# Patient Record
Sex: Female | Born: 1970 | Race: Black or African American | Hispanic: No | Marital: Married | Smoking: Never smoker
Health system: Southern US, Community
[De-identification: ages and names within clinical notes are randomized; demographics above are authoritative.]

## PROBLEM LIST (undated history)

## (undated) DIAGNOSIS — K219 Gastro-esophageal reflux disease without esophagitis: Secondary | ICD-10-CM

## (undated) DIAGNOSIS — G4730 Sleep apnea, unspecified: Secondary | ICD-10-CM

## (undated) DIAGNOSIS — E785 Hyperlipidemia, unspecified: Secondary | ICD-10-CM

## (undated) DIAGNOSIS — M7989 Other specified soft tissue disorders: Secondary | ICD-10-CM

## (undated) DIAGNOSIS — K5900 Constipation, unspecified: Secondary | ICD-10-CM

## (undated) DIAGNOSIS — M549 Dorsalgia, unspecified: Secondary | ICD-10-CM

## (undated) DIAGNOSIS — M2550 Pain in unspecified joint: Secondary | ICD-10-CM

## (undated) DIAGNOSIS — E739 Lactose intolerance, unspecified: Secondary | ICD-10-CM

## (undated) DIAGNOSIS — R42 Dizziness and giddiness: Secondary | ICD-10-CM

## (undated) DIAGNOSIS — I10 Essential (primary) hypertension: Secondary | ICD-10-CM

## (undated) HISTORY — DX: Sleep apnea, unspecified: G47.30

## (undated) HISTORY — PX: DILATION AND CURETTAGE OF UTERUS: SHX78

## (undated) HISTORY — DX: Pain in unspecified joint: M25.50

## (undated) HISTORY — PX: JOINT REPLACEMENT: SHX530

## (undated) HISTORY — DX: Other specified soft tissue disorders: M79.89

## (undated) HISTORY — DX: Gastro-esophageal reflux disease without esophagitis: K21.9

## (undated) HISTORY — DX: Essential (primary) hypertension: I10

## (undated) HISTORY — DX: Lactose intolerance, unspecified: E73.9

## (undated) HISTORY — DX: Hyperlipidemia, unspecified: E78.5

## (undated) HISTORY — DX: Dorsalgia, unspecified: M54.9

## (undated) HISTORY — DX: Constipation, unspecified: K59.00

---

## 2006-01-31 ENCOUNTER — Ambulatory Visit (HOSPITAL_COMMUNITY): Admission: RE | Admit: 2006-01-31 | Discharge: 2006-01-31 | Payer: Self-pay | Admitting: *Deleted

## 2006-05-09 ENCOUNTER — Inpatient Hospital Stay (HOSPITAL_COMMUNITY): Admission: AD | Admit: 2006-05-09 | Discharge: 2006-05-09 | Payer: Self-pay | Admitting: *Deleted

## 2006-06-17 ENCOUNTER — Inpatient Hospital Stay (HOSPITAL_COMMUNITY): Admission: AD | Admit: 2006-06-17 | Discharge: 2006-06-17 | Payer: Self-pay | Admitting: Obstetrics and Gynecology

## 2006-07-13 ENCOUNTER — Inpatient Hospital Stay (HOSPITAL_COMMUNITY): Admission: AD | Admit: 2006-07-13 | Discharge: 2006-07-13 | Payer: Self-pay | Admitting: *Deleted

## 2006-07-19 ENCOUNTER — Inpatient Hospital Stay (HOSPITAL_COMMUNITY): Admission: AD | Admit: 2006-07-19 | Discharge: 2006-07-21 | Payer: Self-pay | Admitting: Obstetrics & Gynecology

## 2010-12-14 ENCOUNTER — Ambulatory Visit: Payer: Self-pay | Admitting: Family Medicine

## 2010-12-14 DIAGNOSIS — Z0289 Encounter for other administrative examinations: Secondary | ICD-10-CM

## 2011-02-02 ENCOUNTER — Ambulatory Visit: Payer: Self-pay | Admitting: Family Medicine

## 2011-02-10 ENCOUNTER — Encounter: Payer: Self-pay | Admitting: Family Medicine

## 2011-02-10 ENCOUNTER — Ambulatory Visit (INDEPENDENT_AMBULATORY_CARE_PROVIDER_SITE_OTHER): Payer: BC Managed Care – PPO | Admitting: Family Medicine

## 2011-02-10 DIAGNOSIS — E669 Obesity, unspecified: Secondary | ICD-10-CM

## 2011-02-10 DIAGNOSIS — E785 Hyperlipidemia, unspecified: Secondary | ICD-10-CM

## 2011-02-10 DIAGNOSIS — I10 Essential (primary) hypertension: Secondary | ICD-10-CM | POA: Insufficient documentation

## 2011-02-10 DIAGNOSIS — B351 Tinea unguium: Secondary | ICD-10-CM

## 2011-02-10 DIAGNOSIS — L659 Nonscarring hair loss, unspecified: Secondary | ICD-10-CM

## 2011-02-10 HISTORY — DX: Tinea unguium: B35.1

## 2011-02-10 HISTORY — DX: Essential (primary) hypertension: I10

## 2011-02-10 HISTORY — DX: Morbid (severe) obesity due to excess calories: E66.01

## 2011-02-10 NOTE — Patient Instructions (Signed)
Please schedule a lab appt at your convenience Schedule your physical in 3 months Keep up the good work on regular exercise- this is so important! Make small changes in the diet and you'll see big results! I'm so proud of you for being proactive in your health! Someone will call you with the podiatry appt Call with any questions or concerns Welcome!  We're glad to have you!

## 2011-02-10 NOTE — Progress Notes (Signed)
  Subjective:    Patient ID: Marcia Chan, female    DOB: April 28, 1971, 40 y.o.   MRN: [id number]  HPI New to establish.  Previous MD- at [facility], he retired.  HTN- chronic problem, well controlled on Coreg and Maxide.  Recently switched from Bystolic to Coreg due to cost.  No CP, SOB, HAs, visual changes, edema.  Hyperlipidemia- chronic problem, last labs done 17 months ago.  On Pravastatin 40, no abd pain, N/V, myalgias.  Obesity- ongoing battle for pt, has started exercising regularly.  Admits she's not following particular diet.  Nail fungus- sxs x3 yrs, has taken oral meds w/out results.  Feet bilaterally  Hair loss- started 10 yrs ago.  Has seen 2 different dermatologists w/out results.   Review of Systems For ROS see HPI     Objective:   Physical Exam  Vitals reviewed. Constitutional: She is oriented to person, place, and time. She appears well-developed and well-nourished. No distress.  HENT:  Head: Normocephalic and atraumatic.       Marked alopecia at crown  Eyes: Conjunctivae and EOM are normal. Pupils are equal, round, and reactive to light.  Neck: Normal range of motion. Neck supple. No thyromegaly present.  Cardiovascular: Normal rate, regular rhythm, normal heart sounds and intact distal pulses.   No murmur heard. Pulmonary/Chest: Effort normal and breath sounds normal. No respiratory distress.  Abdominal: Soft. She exhibits no distension. There is no tenderness.  Musculoskeletal: She exhibits no edema.  Lymphadenopathy:    She has no cervical adenopathy.  Neurological: She is alert and oriented to person, place, and time.  Skin: Skin is warm and dry.       Toenail fungus bilaterally  Psychiatric: She has a normal mood and affect. Her behavior is normal.          Assessment & Plan:

## 2011-02-13 ENCOUNTER — Telehealth: Payer: Self-pay

## 2011-02-13 ENCOUNTER — Other Ambulatory Visit (INDEPENDENT_AMBULATORY_CARE_PROVIDER_SITE_OTHER): Payer: BC Managed Care – PPO

## 2011-02-13 DIAGNOSIS — E785 Hyperlipidemia, unspecified: Secondary | ICD-10-CM

## 2011-02-13 DIAGNOSIS — I10 Essential (primary) hypertension: Secondary | ICD-10-CM

## 2011-02-13 LAB — BASIC METABOLIC PANEL
BUN: 11 mg/dL (ref 6–23)
CO2: 29 mEq/L (ref 19–32)
Calcium: 8.8 mg/dL (ref 8.4–10.5)
Creatinine, Ser: 0.7 mg/dL (ref 0.4–1.2)
GFR: 116.66 mL/min (ref >60.00)
Glucose, Bld: 96 mg/dL (ref 70–99)
Potassium: 3.8 mEq/L (ref 3.5–5.1)

## 2011-02-13 LAB — CBC WITH DIFFERENTIAL/PLATELET
Basophils Relative: 0.4 % (ref 0.0–3.0)
Eosinophils Relative: 0.8 % (ref 0.0–5.0)
HCT: 36.8 % (ref 36.0–46.0)
Lymphocytes Relative: 32.9 % (ref 12.0–46.0)
MCHC: 33.6 g/dL (ref 30.0–36.0)
Platelets: 210 10*3/uL (ref 150.0–400.0)
RBC: 4.05 Mil/uL (ref 3.87–5.11)

## 2011-02-13 LAB — LIPID PANEL
LDL Cholesterol: 86 mg/dL (ref 0–99)
Total CHOL/HDL Ratio: 4
Triglycerides: 85 mg/dL (ref 0.0–149.0)

## 2011-02-13 LAB — HEPATIC FUNCTION PANEL
Albumin: 3.9 g/dL (ref 3.5–5.2)
Total Bilirubin: 0.6 mg/dL (ref 0.3–1.2)

## 2011-02-13 NOTE — Progress Notes (Signed)
Labs only

## 2011-02-13 NOTE — Telephone Encounter (Signed)
Message copied by Beverely Low on Mon Feb 13, 2011  5:03 PM ------      Message from: Sheliah Hatch      Created: Mon Feb 13, 2011  3:02 PM       Labs look good!  Keep up the good work!

## 2011-02-13 NOTE — Telephone Encounter (Signed)
Labs mailed

## 2011-02-21 ENCOUNTER — Encounter: Payer: Self-pay | Admitting: Family Medicine

## 2011-02-21 DIAGNOSIS — L659 Nonscarring hair loss, unspecified: Secondary | ICD-10-CM | POA: Insufficient documentation

## 2011-02-21 HISTORY — DX: Nonscarring hair loss, unspecified: L65.9

## 2011-02-21 NOTE — Assessment & Plan Note (Signed)
Hair loss is extensive.  Will refer to derm for additional evaluation.

## 2011-02-21 NOTE — Progress Notes (Signed)
Addended by: Sheliah Hatch on: 02/21/2011 02:13 PM   Modules accepted: Orders

## 2011-02-21 NOTE — Assessment & Plan Note (Signed)
Tolerating statin w/out difficulty.  Due for labs.  Adjust meds prn. 

## 2011-02-21 NOTE — Assessment & Plan Note (Signed)
Pt now exercising, encouraged her to focus on making small dietary changes.  Will follow closely.

## 2011-02-21 NOTE — Assessment & Plan Note (Signed)
BP is well controlled.  Tolerating meds w/out difficulty.  Asymptomatic.  No changes

## 2011-02-21 NOTE — Assessment & Plan Note (Addendum)
Since pt has taken oral meds before w/out results will defer to podiatry for additional tx.  Pt expressed understanding and is in agreement w/ plan.

## 2011-05-10 ENCOUNTER — Ambulatory Visit: Payer: BC Managed Care – PPO | Admitting: Family Medicine

## 2011-05-10 DIAGNOSIS — Z0289 Encounter for other administrative examinations: Secondary | ICD-10-CM

## 2011-06-02 ENCOUNTER — Ambulatory Visit (INDEPENDENT_AMBULATORY_CARE_PROVIDER_SITE_OTHER): Payer: BC Managed Care – PPO | Admitting: Family

## 2011-06-02 ENCOUNTER — Encounter: Payer: Self-pay | Admitting: Family

## 2011-06-02 VITALS — BP 126/88 | HR 76 | Temp 98.2°F | Resp 18 | Wt 284.0 lb

## 2011-06-02 DIAGNOSIS — J069 Acute upper respiratory infection, unspecified: Secondary | ICD-10-CM

## 2011-06-02 NOTE — Progress Notes (Signed)
  Subjective:    Patient ID: Marcia Chan, female    DOB: Sep 23, 1970, 40 y.o.   MRN: [id number]  HPI Ms. Marcia Chan is a 40 yr old patient of Dr. Rennis Golden who presents today with chief complaint of sore throat/nasal congestion.  Symptoms started 2-3 days ago with "sore throat." Then started losing her voice and developed nasal drainage. She has a productive cough.  She reports that her cough has worsening. She has been using warm honey, mucinex with minimal relief. Denies fever or ear pain.     Review of Systems See HPI  Past Medical History  Diagnosis Date  . Hyperlipidemia   . Hypertension     History   Social History  . Marital Status: Married    Spouse Name: N/A    Number of Children: N/A  . Years of Education: N/A   Occupational History  . Not on file.   Social History Main Topics  . Smoking status: Never Smoker   . Smokeless tobacco: Not on file  . Alcohol Use: No  . Drug Use: No  . Sexually Active: Not on file   Other Topics Concern  . Not on file   Social History Narrative   G3 P1 A2    No past surgical history on file.  Family History  Problem Relation Age of Onset  . Arthritis Mother   . Heart disease Mother   . Diabetes Mother   . Hypertension Mother     Allergies  Allergen Reactions  . Bextra (Valdecoxib)     Current Outpatient Prescriptions on File Prior to Visit  Medication Sig Dispense Refill  . carvedilol (COREG) 6.25 MG tablet Take 6.25 mg by mouth 2 (two) times daily with a meal.        . pravastatin (PRAVACHOL) 80 MG tablet Take 40 mg by mouth daily.        Marland Kitchen triamterene-hydrochlorothiazide (MAXZIDE-25) 37.5-25 MG per tablet Take 1 tablet by mouth daily.          BP 126/88  Pulse 76  Temp(Src) 98.2 F (36.8 C) (Oral)  Resp 18  Wt 284 lb (128.822 kg)  SpO2 100%       Objective:   Physical Exam  Constitutional: She appears well-developed and well-nourished. No distress.  HENT:  Head: Normocephalic and atraumatic.  Right  Ear: Tympanic membrane and ear canal normal.  Left Ear: Tympanic membrane and ear canal normal.  Mouth/Throat: Uvula is midline and oropharynx is clear and moist. No oropharyngeal exudate, posterior oropharyngeal edema or posterior oropharyngeal erythema.  Cardiovascular: Normal rate and regular rhythm.   No murmur heard. Pulmonary/Chest: Effort normal and breath sounds normal. No respiratory distress. She has no wheezes. She has no rales. She exhibits no tenderness.          Assessment & Plan:  URI- rapid strep is negative.  Recommended supportive measures as outlined in AVS and to call if symptoms worsen or do not improve.

## 2011-06-02 NOTE — Patient Instructions (Signed)

## 2011-06-12 ENCOUNTER — Ambulatory Visit: Payer: BC Managed Care – PPO | Admitting: Family Medicine

## 2011-06-21 ENCOUNTER — Ambulatory Visit (INDEPENDENT_AMBULATORY_CARE_PROVIDER_SITE_OTHER): Payer: BC Managed Care – PPO | Admitting: Family Medicine

## 2011-06-21 VITALS — BP 120/74 | HR 78 | Temp 98.9°F | Wt 286.0 lb

## 2011-06-21 DIAGNOSIS — R059 Cough, unspecified: Secondary | ICD-10-CM

## 2011-06-21 DIAGNOSIS — R05 Cough: Secondary | ICD-10-CM

## 2011-06-21 HISTORY — DX: Cough, unspecified: R05.9

## 2011-06-21 MED ORDER — AZITHROMYCIN 250 MG PO TABS: 250.0000 mg | ORAL_TABLET | Freq: Every day | ORAL | Status: AC

## 2011-06-21 MED ORDER — GUAIFENESIN-CODEINE 100-10 MG/5ML PO SYRP: 5.0000 mL | ORAL_SOLUTION | Freq: Three times a day (TID) | ORAL | Status: AC | PRN

## 2011-06-21 MED ORDER — BENZONATATE 200 MG PO CAPS: 200.0000 mg | ORAL_CAPSULE | Freq: Three times a day (TID) | ORAL | Status: AC | PRN

## 2011-06-21 NOTE — Progress Notes (Signed)
  Subjective:    Patient ID: Marcia Chan, female    DOB: Jan 25, 1971, 40 y.o.   MRN: [id number]  HPI Cough- sxs started 1 month ago.  sxs were initially nasal congestion, PND, cough productive of yellow sputum.  Went to [facility], was told it was viral URI.  sxs initially improved but 10 days after that (2 weeks ago) developed severe cough.  Went to [facility] last Saturday- dx'd w/ bronchitis.  Had breathing tx, normal lab work.  No abx given.  Was told to f/u w/ PCP.  Again started feeling better but 2-3 days ago developed dry, barking cough.  This cough is painful.  No nasal congestion, ear pain, fevers, body aches.   Review of Systems For ROS see HPI     Objective:   Physical Exam  Constitutional: She appears well-developed and well-nourished. No distress.  HENT:  Head: Normocephalic and atraumatic.       TMs normal bilaterally Mild nasal congestion Throat w/out erythema, edema, or exudate  Eyes: Conjunctivae and EOM are normal. Pupils are equal, round, and reactive to light.  Neck: Normal range of motion. Neck supple.  Cardiovascular: Normal rate, regular rhythm, normal heart sounds and intact distal pulses.   No murmur heard. Pulmonary/Chest: Effort normal and breath sounds normal. No respiratory distress. She has no wheezes.       + hacking cough  Lymphadenopathy:    She has no cervical adenopathy.          Assessment & Plan:

## 2011-06-21 NOTE — Patient Instructions (Signed)
Take the Zpack as directed for likely bronchitis Use the cough syrup at night and pills for daytime Drink plenty of fluids REST! Ibuprofen 2 tabs 3x/day for airway inflammation Call if no improvement Happy Holidays!!!

## 2011-06-25 NOTE — Assessment & Plan Note (Signed)
Pt's sxs consistent w/ bronchitis.  Start zpack.  Cough meds prn.  Reviewed supportive care and red flags that should prompt return.  Pt expressed understanding and is in agreement w/ plan.

## 2011-07-24 ENCOUNTER — Encounter: Payer: Self-pay | Admitting: Family Medicine

## 2011-07-24 ENCOUNTER — Ambulatory Visit (INDEPENDENT_AMBULATORY_CARE_PROVIDER_SITE_OTHER): Payer: BC Managed Care – PPO | Admitting: Family Medicine

## 2011-07-24 VITALS — BP 126/82 | HR 76 | Temp 98.8°F | Wt 285.0 lb

## 2011-07-24 DIAGNOSIS — R1013 Epigastric pain: Secondary | ICD-10-CM

## 2011-07-24 DIAGNOSIS — K3189 Other diseases of stomach and duodenum: Secondary | ICD-10-CM

## 2011-07-24 NOTE — Patient Instructions (Signed)
Diet for GERD or PUD Nutrition therapy can help ease the discomfort of gastroesophageal reflux disease (GERD) and peptic ulcer disease (PUD).  HOME CARE INSTRUCTIONS   Eat your meals slowly, in a relaxed setting.   Eat 5 to 6 small meals per day.   If a food causes distress, stop eating it for a period of time.  FOODS TO AVOID  Coffee, regular or decaffeinated.   Cola beverages, regular or low calorie.   Tea, regular or decaffeinated.   Pepper.   Cocoa.   High fat foods, including meats.   Butter, margarine, hydrogenated oil (trans fats).   Peppermint or spearmint (if you have GERD).   Fruits and vegetables if not tolerated.   Alcohol.   Nicotine (smoking or chewing). This is one of the most potent stimulants to acid production in the gastrointestinal tract.   Any food that seems to aggravate your condition.  If you have questions regarding your diet, ask your caregiver or a registered dietitian. TIPS  Lying flat may make symptoms worse. Keep the head of your bed raised 6 to 9 inches (15 to 23 cm) by using a foam wedge or blocks under the legs of the bed.   Do not lay down until 3 hours after eating a meal.   Daily physical activity may help reduce symptoms.  MAKE SURE YOU:   Understand these instructions.   Will watch your condition.   Will get help right away if you are not doing well or get worse.  Document Released: 06/19/2005 Document Revised: 03/01/2011 Document Reviewed: 11/02/2008 [employer] Patient Information 2012 [employer]. 

## 2011-07-24 NOTE — Progress Notes (Signed)
  Subjective:     Marcia Chan is a 41 y.o. female who presents for evaluation of abdominal pain. Onset was several weeks ago. Symptoms have been gradually worsening. The pain is described as burning, and is 5/10 in intensity. Pain is located in the epigastric region without radiation.  Aggravating factors: eating.  Alleviating factors: antacids, belching, flatus, H2 blockers and proton pump inhibitors. Associated symptoms: belching. The patient denies anorexia, arthralagias, belching, chills, constipation, diarrhea, dysuria, fever, flatus, frequency, headache, hematochezia, hematuria, melena, myalgias, nausea, sweats, vomiting and diarrhea.  The patient's history has been marked as reviewed and updated as appropriate.  Review of Systems Pertinent items are noted in HPI.     Objective:    BP 126/82  Pulse 76  Temp(Src) 98.8 F (37.1 C) (Oral)  Wt 285 lb (129.275 kg)  SpO2 97% General appearance: alert, cooperative, appears stated age and no distress Nose: Nares normal. Septum midline. Mucosa normal. No drainage or sinus tenderness. Throat: lips, mucosa, and tongue normal; teeth and gums normal Neck: no adenopathy, no carotid bruit, no JVD, supple, symmetrical, trachea midline and thyroid not enlarged, symmetric, no tenderness/mass/nodules Lungs: clear to auscultation bilaterally Extremities: extremities normal, atraumatic, no cyanosis or edema   abd--soft , nt, no guarding Assessment:    Abdominal pain, likely secondary to dyspepsia .    Plan:  nexium 40 mg 1 po qd Check labs HO on gerd given to pt  The diagnosis was discussed with the patient and evaluation and treatment plans outlined. See orders for lab and imaging studies. Adhere to simple, bland diet. Adhere to low fat diet. Follow up as needed.

## 2011-07-25 LAB — BASIC METABOLIC PANEL
BUN: 10 mg/dL (ref 6–23)
CO2: 30 mEq/L (ref 19–32)
Calcium: 9.6 mg/dL (ref 8.4–10.5)
Chloride: 101 mEq/L (ref 96–112)
GFR: 133.62 mL/min (ref >60.00)
Potassium: 3.6 mEq/L (ref 3.5–5.1)
Sodium: 136 mEq/L (ref 135–145)

## 2011-07-25 LAB — HEPATIC FUNCTION PANEL
ALT: 15 U/L (ref 0–35)
AST: 21 U/L (ref 0–37)
Alkaline Phosphatase: 55 U/L (ref 39–117)
Bilirubin, Direct: 0.1 mg/dL (ref 0.0–0.3)
Total Bilirubin: 0.6 mg/dL (ref 0.3–1.2)
Total Protein: 7.5 g/dL (ref 6.0–8.3)

## 2011-07-25 LAB — CBC WITH DIFFERENTIAL/PLATELET
Basophils Relative: 0.6 % (ref 0.0–3.0)
HCT: 39.9 % (ref 36.0–46.0)
Hemoglobin: 13.6 g/dL (ref 12.0–15.0)
Lymphs Abs: 2.4 10*3/uL (ref 0.7–4.0)
MCHC: 34.1 g/dL (ref 30.0–36.0)
Monocytes Relative: 3.9 % (ref 3.0–12.0)
RDW: 12.8 % (ref 11.5–14.6)

## 2011-07-25 LAB — H. PYLORI ANTIBODY, IGG: H Pylori IgG: NEGATIVE

## 2011-08-01 ENCOUNTER — Telehealth: Payer: Self-pay | Admitting: Family Medicine

## 2011-08-01 NOTE — Telephone Encounter (Signed)
.  left message to have patient return my call.  

## 2011-08-01 NOTE — Telephone Encounter (Signed)
Patient calling, currently is unable to view her recent lab results thru MyChart (is calling technical support), but she would like her lab results please.

## 2011-08-02 ENCOUNTER — Telehealth: Payer: Self-pay | Admitting: Family Medicine

## 2011-08-02 NOTE — Telephone Encounter (Signed)
Last OV 07-24-11 pt called in for results, please advise

## 2011-08-02 NOTE — Telephone Encounter (Signed)
Patient called the office asking to have someone call her back in regards to her lab results. She stated that she called on 1/29 and hasn't heard from anyone yet. Best number to reach her at is her cell # [phone]. Please do not call home number because she has no answering machine.

## 2011-08-02 NOTE — Telephone Encounter (Signed)
Noted pt labs have not been routed to [facility] per she had OV with pt on 07-24-11 pt noted her sxs have improved somewhat with the nexium however still hard to deal with per she still has bloated stomach feeling when she eats mostly and more at night, please advise

## 2011-08-02 NOTE — Telephone Encounter (Signed)
Pt should continue the Nexium, follow the lifestyle modifications as outlined in her pt instructions.  Add Gas-X or Beano as needed for gas and bloating.  Needs to give the Nexium more time to work- it has only been 9 days.  If sxs continue, will need GI referral

## 2011-08-02 NOTE — Telephone Encounter (Signed)
Pt calling in to received results, noted OV 07-24-11, please advise    Called pt to advise instructions for current sxs, pt understood and will try the gas-x and will notify office is sxs continue.

## 2011-08-03 NOTE — Telephone Encounter (Signed)
Dr.Lowne patient is calling for her test results. Please advise     KP

## 2011-08-03 NOTE — Telephone Encounter (Signed)
Cbc looks like virus Repeat 2-3 weeks  288.8  cbcd Rest labs normal

## 2011-08-03 NOTE — Telephone Encounter (Signed)
Did not order labs on pt

## 2011-08-03 NOTE — Telephone Encounter (Signed)
Discussed with patient and she voiced understanding, advised to drink plenty of fluids and be sure to rest---recheck scheduled for 2/15  KP

## 2011-08-03 NOTE — Telephone Encounter (Signed)
Agree with Dr Beverely Low---- GI referral put in

## 2011-08-03 NOTE — Progress Notes (Signed)
Addended by: Lelon Perla on: 08/03/2011 08:38 AM   Modules accepted: Orders

## 2011-08-08 NOTE — Telephone Encounter (Signed)
Labs were discussed--see phone note    KP

## 2011-08-08 NOTE — Telephone Encounter (Signed)
This patient had labs done on 07-24-11, and just needs her results please.

## 2011-08-17 ENCOUNTER — Other Ambulatory Visit: Payer: Self-pay | Admitting: Family Medicine

## 2011-08-17 DIAGNOSIS — D7289 Other specified disorders of white blood cells: Secondary | ICD-10-CM

## 2011-08-18 ENCOUNTER — Other Ambulatory Visit (INDEPENDENT_AMBULATORY_CARE_PROVIDER_SITE_OTHER): Payer: BC Managed Care – PPO

## 2011-08-18 DIAGNOSIS — D7289 Other specified disorders of white blood cells: Secondary | ICD-10-CM

## 2011-08-18 LAB — CBC WITH DIFFERENTIAL/PLATELET
Basophils Absolute: 0 10*3/uL (ref 0.0–0.1)
Basophils Relative: 0 % (ref 0–1)
Eosinophils Absolute: 0.1 10*3/uL (ref 0.0–0.7)
Hemoglobin: 12.6 g/dL (ref 12.0–15.0)
MCH: 29.2 pg (ref 26.0–34.0)
MCHC: 33.2 g/dL (ref 30.0–36.0)
Neutro Abs: 3.3 10*3/uL (ref 1.7–7.7)
Neutrophils Relative %: 44 % (ref 43–77)
Platelets: 251 10*3/uL (ref 150–400)
RDW: 12.1 % (ref 11.5–15.5)

## 2011-08-21 ENCOUNTER — Encounter: Payer: Self-pay | Admitting: *Deleted

## 2011-08-25 ENCOUNTER — Telehealth: Payer: Self-pay | Admitting: Family Medicine

## 2011-08-25 ENCOUNTER — Other Ambulatory Visit: Payer: Self-pay | Admitting: Family Medicine

## 2011-08-25 MED ORDER — CARVEDILOL 6.25 MG PO TABS: 6.2500 mg | ORAL_TABLET | Freq: Two times a day (BID) | ORAL | Status: DC

## 2011-08-25 MED ORDER — TRIAMTERENE-HCTZ 37.5-25 MG PO TABS: 1.0000 | ORAL_TABLET | Freq: Every day | ORAL | Status: DC

## 2011-08-25 MED ORDER — PRAVASTATIN SODIUM 80 MG PO TABS: 40.0000 mg | ORAL_TABLET | Freq: Every day | ORAL | Status: DC

## 2011-08-25 NOTE — Telephone Encounter (Signed)
rx sent to pharmacy by e-script  

## 2011-08-25 NOTE — Telephone Encounter (Signed)
Pt left vm stating she needs refills on her medications, called pt to advise that we received a fax concerning the HCTZ and that has already been sent, advised that she needs coreg and pravastatin, sent both via escribe for pt to be picked up. Pt understood

## 2011-08-25 NOTE — Telephone Encounter (Signed)
Triamterene 37.5MG /HCTZ 25MG  Tabs Generic: Maxzide-25 (37.5/25) Tabs (green)  Qty 90/each Last refill date 06-01-11

## 2011-08-25 NOTE — Telephone Encounter (Signed)
Refill: Pravastatin 40mg  tablets. Take 1 tablet by mouth every night at bedtime. Qty 90. Last fill 03-03-11

## 2011-08-28 ENCOUNTER — Encounter: Payer: Self-pay | Admitting: Internal Medicine

## 2011-08-28 ENCOUNTER — Ambulatory Visit (INDEPENDENT_AMBULATORY_CARE_PROVIDER_SITE_OTHER): Payer: BC Managed Care – PPO | Admitting: Internal Medicine

## 2011-08-28 VITALS — BP 122/90 | HR 74 | Temp 98.4°F | Wt 280.6 lb

## 2011-08-28 DIAGNOSIS — M545 Low back pain, unspecified: Secondary | ICD-10-CM

## 2011-08-28 MED ORDER — HYDROCODONE-ACETAMINOPHEN 7.5-500 MG PO TABS: 1.0000 | ORAL_TABLET | ORAL | Status: AC | PRN

## 2011-08-28 MED ORDER — CYCLOBENZAPRINE HCL 5 MG PO TABS: 5.0000 mg | ORAL_TABLET | Freq: Three times a day (TID) | ORAL | Status: AC | PRN

## 2011-08-28 NOTE — Patient Instructions (Signed)
Please remain out of work until  08/30/11

## 2011-08-28 NOTE — Progress Notes (Signed)
  Subjective:    Patient ID: Marcia Chan, female    DOB: [birth year], 41 y.o.   MRN: [id number]  HPI BACK PAIN: Location:lower back Onset:08/18/2010 Trigger/injury:after bending over to dress Pain quality:sharp Pain severity:up to 10+ Duration:present while moving Radiation:to L popliteal space  & into R thigh Exacerbating factors:any movement Treatment/response: Aleve & Tylenol helped   She has no past history of any significant back injury or orthopedic interventions. She has a history of rash with Bextra; she does nt remember why she was taking it.  Her mother has had degenerative joint disease of the knees     Review of Systems Constitutional: no fever, chills, sweats, change in weight  Musculoskeletal:no  muscle cramps or pain; no  joint stiffness, redness, or swelling Skin:no rash, color change Neuro: no weakness; incontinence (stool/urine); numbness and tingling Heme:no lymphadenopathy; abnormal bruising or bleeding   She denies hematuria, pyuria, or dysuria.        Objective:   Physical Exam  Gen.:well-nourished in appearance. Alert, appropriate and cooperative throughout exam.Uncomfortable  Eyes: No corneal or conjunctival inflammation noted. Neck: No deformities, masses, or tenderness noted. Range of motion normal Lungs: Normal respiratory effort; chest expands symmetrically. Lungs are clear to auscultation without rales, wheezes, or increased work of breathing. Heart: Normal rate and rhythm. Normal S1 and S2. No gallop, click, or rub. No murmur. Abdomen: Bowel sounds normal; abdomen soft and nontender. No masses, organomegaly or hernias noted.                                                      Musculoskeletal/extremities: No deformity or scoliosis noted of  the thoracic or lumbar spine. No clubbing, cyanosis, edema, or deformity noted. Range of motion  normal; pain in the left lumbosacral area with straight leg raising past 60. Classic "low back crawl" up and  down exam table .Tone & strength  normal.Joints normal. Nail health  good. Vascular: dorsalis pedis and  posterior tibial pulses are full and equal. No bruits present. Neurologic: Alert and oriented x3. Deep tendon reflexes symmetrical and normal. Gait is normal including tiptoe and heel walking        Skin: Intact without suspicious lesions or rashes. Lymph: No cervical, axillary lymphadenopathy present. Psych: Mood and affect are normal. Normally interactive                                                                                         Assessment & Plan:  #1 classic acute low back syndrome; no neuromuscular deficits present  Plan: See orders and recommendations

## 2011-10-06 ENCOUNTER — Encounter: Payer: Self-pay | Admitting: Family Medicine

## 2011-10-06 ENCOUNTER — Ambulatory Visit (INDEPENDENT_AMBULATORY_CARE_PROVIDER_SITE_OTHER): Payer: BC Managed Care – PPO | Admitting: Family Medicine

## 2011-10-06 VITALS — BP 127/82 | HR 91 | Temp 98.8°F | Ht 67.0 in | Wt 277.4 lb

## 2011-10-06 DIAGNOSIS — I10 Essential (primary) hypertension: Secondary | ICD-10-CM

## 2011-10-06 DIAGNOSIS — E785 Hyperlipidemia, unspecified: Secondary | ICD-10-CM

## 2011-10-06 DIAGNOSIS — Z0000 Encounter for general adult medical examination without abnormal findings: Secondary | ICD-10-CM | POA: Insufficient documentation

## 2011-10-06 DIAGNOSIS — J309 Allergic rhinitis, unspecified: Secondary | ICD-10-CM

## 2011-10-06 DIAGNOSIS — J302 Other seasonal allergic rhinitis: Secondary | ICD-10-CM

## 2011-10-06 HISTORY — DX: Encounter for general adult medical examination without abnormal findings: Z00.00

## 2011-10-06 LAB — BASIC METABOLIC PANEL
BUN: 13 mg/dL (ref 6–23)
Creatinine, Ser: 0.6 mg/dL (ref 0.4–1.2)
GFR: 146.86 mL/min (ref >60.00)
Glucose, Bld: 95 mg/dL (ref 70–99)
Potassium: 3.6 mEq/L (ref 3.5–5.1)

## 2011-10-06 LAB — LIPID PANEL
HDL: 32.4 mg/dL — ABNORMAL LOW (ref >39.00)
LDL Cholesterol: 105 mg/dL — ABNORMAL HIGH (ref 0–99)
Triglycerides: 58 mg/dL (ref 0.0–149.0)

## 2011-10-06 LAB — CBC WITH DIFFERENTIAL/PLATELET
Basophils Relative: 0.5 % (ref 0.0–3.0)
Eosinophils Relative: 1.2 % (ref 0.0–5.0)
HCT: 39.4 % (ref 36.0–46.0)
Lymphs Abs: 2.1 10*3/uL (ref 0.7–4.0)
MCV: 90.6 fl (ref 78.0–100.0)
Monocytes Absolute: 0.7 10*3/uL (ref 0.1–1.0)
RDW: 12.3 % (ref 11.5–14.6)
WBC: 6.8 10*3/uL (ref 4.5–10.5)

## 2011-10-06 LAB — HEPATIC FUNCTION PANEL: AST: 21 U/L (ref 0–37)

## 2011-10-06 NOTE — Patient Instructions (Signed)
Follow up in 6 months to recheck BP and cholesterol You look great! We'll notify you of your lab results and make any changes if needed Try and make healthy food choices and get regular exercise Start Claritin or Zyrtec daily for the allergy component Miralax as needed Hang in there!!!

## 2011-10-06 NOTE — Progress Notes (Signed)
  Subjective:    Patient ID: Marcia Chan, female    DOB: [birth year], 41 y.o.   MRN: [id number]  HPI CPE- UTD on GYN.  Nasal congestion- sxs started 3 weeks ago, taking Advil cold and sinus w/out relief.  Started Mucinex yesterday.  + PND.  No facial pain/pressure.  No ear pain.  + cough.   Review of Systems Patient reports no vision/ hearing changes, adenopathy,fever, weight change,  persistant/recurrent hoarseness , swallowing issues, chest pain, palpitations, edema, persistant/recurrent cough, hemoptysis, dyspnea (rest/exertional/paroxysmal nocturnal), gastrointestinal bleeding (melena, rectal bleeding), abdominal pain, significant heartburn, bowel changes, GU symptoms (dysuria, hematuria, incontinence), Gyn symptoms (abnormal  bleeding, pain),  syncope, focal weakness, memory loss, numbness & tingling, skin/hair/nail changes, abnormal bruising or bleeding, anxiety, or depression.     Objective:   Physical Exam General Appearance:    Alert, cooperative, no distress, appears stated age  Head:    Normocephalic, without obvious abnormality, atraumatic  Eyes:    PERRL, conjunctiva/corneas clear, EOM's intact, fundi    benign, both eyes  Ears:    Normal TM's and external ear canals, both ears  Nose:   Nares normal, septum midline, mucosa edematous, but no sinus tenderness  Throat:   Lips, mucosa, and tongue normal; teeth and gums normal.  + PND  Neck:   Supple, symmetrical, trachea midline, no adenopathy;    Thyroid: no enlargement/tenderness/nodules  Back:     Symmetric, no curvature, ROM normal, no CVA tenderness  Lungs:     Clear to auscultation bilaterally, respirations unlabored  Chest Wall:    No tenderness or deformity   Heart:    Regular rate and rhythm, S1 and S2 normal, no murmur, rub   or gallop  Breast Exam:    Deferred to GYN  Abdomen:     Soft, non-tender, bowel sounds active all four quadrants,    no masses, no organomegaly  Genitalia:    Deferred to GYN  Rectal:      Extremities:   Extremities normal, atraumatic, no cyanosis or edema  Pulses:   2+ and symmetric all extremities  Skin:   Skin color, texture, turgor normal, no rashes or lesions  Lymph nodes:   Cervical, supraclavicular, and axillary nodes normal  Neurologic:   CNII-XII intact, normal strength, sensation and reflexes    throughout          Assessment & Plan:

## 2011-10-08 DIAGNOSIS — J302 Other seasonal allergic rhinitis: Secondary | ICD-10-CM | POA: Insufficient documentation

## 2011-10-08 HISTORY — DX: Other seasonal allergic rhinitis: J30.2

## 2011-10-08 NOTE — Assessment & Plan Note (Signed)
Well controlled.  Asymptomatic.  No changes. 

## 2011-10-08 NOTE — Assessment & Plan Note (Signed)
New.  Start OTC antihistamine. 

## 2011-10-08 NOTE — Assessment & Plan Note (Signed)
Pt's PE WNL w/ exception of allergic rhinitis.  UTD on GYN.  Check labs.  Anticipatory guidance provided.

## 2011-10-08 NOTE — Assessment & Plan Note (Signed)
Chronic problem.  Check labs.  Adjust meds prn  

## 2011-10-09 LAB — VITAMIN D 1,25 DIHYDROXY
Vitamin D 1, 25 (OH)2 Total: 62 pg/mL (ref 18–72)
Vitamin D3 1, 25 (OH)2: 62 pg/mL

## 2011-10-10 ENCOUNTER — Encounter: Payer: Self-pay | Admitting: *Deleted

## 2012-02-23 ENCOUNTER — Other Ambulatory Visit: Payer: Self-pay | Admitting: Family Medicine

## 2012-02-23 MED ORDER — TRIAMTERENE-HCTZ 37.5-25 MG PO TABS: 1.0000 | ORAL_TABLET | Freq: Every day | ORAL | Status: DC

## 2012-02-23 NOTE — Telephone Encounter (Signed)
refill triamterene 37.5mg /hctz 25mg  tabs #90 take one tablet by mouth every day last fill 5.23.13, last ov V70 4.5.13

## 2012-02-23 NOTE — Telephone Encounter (Signed)
rx sent to pharmacy by e-script  

## 2012-02-28 ENCOUNTER — Other Ambulatory Visit: Payer: Self-pay | Admitting: Family Medicine

## 2012-02-28 MED ORDER — CARVEDILOL 6.25 MG PO TABS: 6.2500 mg | ORAL_TABLET | Freq: Two times a day (BID) | ORAL | Status: DC

## 2012-02-28 NOTE — Telephone Encounter (Signed)
rx sent to pharmacy by e-script  

## 2012-02-28 NOTE — Telephone Encounter (Signed)
Refill Carvedilol (Tab) 6.25 MG Take 1 tablet (6.25 mg total) by mouth 2 (two) times daily with a meal. #180-last fill 6.28.13 Last ov 4.5.13 v70

## 2012-03-26 ENCOUNTER — Telehealth: Payer: Self-pay | Admitting: Family Medicine

## 2012-03-26 MED ORDER — CARVEDILOL 6.25 MG PO TABS: 6.2500 mg | ORAL_TABLET | Freq: Two times a day (BID) | ORAL | Status: DC

## 2012-03-26 NOTE — Telephone Encounter (Signed)
rx sent to pharmacy by e-script  

## 2012-03-26 NOTE — Telephone Encounter (Signed)
Refill: carvedilol 6.25mg  tablets. Take 1 tablet by mouth twice daily with a meal. Qty 180. Last fill 12-23-11

## 2012-04-05 ENCOUNTER — Ambulatory Visit: Payer: BC Managed Care – PPO | Admitting: Family Medicine

## 2012-04-08 ENCOUNTER — Ambulatory Visit: Payer: BC Managed Care – PPO | Admitting: Family Medicine

## 2012-04-08 DIAGNOSIS — Z0289 Encounter for other administrative examinations: Secondary | ICD-10-CM

## 2012-05-03 ENCOUNTER — Ambulatory Visit (INDEPENDENT_AMBULATORY_CARE_PROVIDER_SITE_OTHER): Payer: BC Managed Care – PPO | Admitting: Family Medicine

## 2012-05-03 ENCOUNTER — Encounter: Payer: Self-pay | Admitting: Family Medicine

## 2012-05-03 VITALS — BP 125/71 | HR 72 | Temp 98.6°F | Ht 67.0 in | Wt 272.7 lb

## 2012-05-03 DIAGNOSIS — K219 Gastro-esophageal reflux disease without esophagitis: Secondary | ICD-10-CM | POA: Insufficient documentation

## 2012-05-03 DIAGNOSIS — I10 Essential (primary) hypertension: Secondary | ICD-10-CM

## 2012-05-03 DIAGNOSIS — Z23 Encounter for immunization: Secondary | ICD-10-CM

## 2012-05-03 DIAGNOSIS — E785 Hyperlipidemia, unspecified: Secondary | ICD-10-CM

## 2012-05-03 LAB — HEPATIC FUNCTION PANEL
ALT: 13 U/L (ref 0–35)
AST: 16 U/L (ref 0–37)
Albumin: 3.6 g/dL (ref 3.5–5.2)
Alkaline Phosphatase: 54 U/L (ref 39–117)
Bilirubin, Direct: 0.1 mg/dL (ref 0.0–0.3)
Total Protein: 7.2 g/dL (ref 6.0–8.3)

## 2012-05-03 LAB — BASIC METABOLIC PANEL
CO2: 27 mEq/L (ref 19–32)
Chloride: 102 mEq/L (ref 96–112)
Glucose, Bld: 86 mg/dL (ref 70–99)
Potassium: 3.5 mEq/L (ref 3.5–5.1)
Sodium: 135 mEq/L (ref 135–145)

## 2012-05-03 LAB — LIPID PANEL
HDL: 29.9 mg/dL — ABNORMAL LOW (ref >39.00)
Total CHOL/HDL Ratio: 5

## 2012-05-03 MED ORDER — TRIAMTERENE-HCTZ 37.5-25 MG PO TABS: 1.0000 | ORAL_TABLET | Freq: Every day | ORAL | Status: DC

## 2012-05-03 MED ORDER — PRAVASTATIN SODIUM 40 MG PO TABS: 40.0000 mg | ORAL_TABLET | Freq: Every day | ORAL | Status: DC

## 2012-05-03 MED ORDER — PANTOPRAZOLE SODIUM 40 MG PO TBEC: 40.0000 mg | DELAYED_RELEASE_TABLET | Freq: Every day | ORAL | Status: DC

## 2012-05-03 MED ORDER — CARVEDILOL 6.25 MG PO TABS: 6.2500 mg | ORAL_TABLET | Freq: Two times a day (BID) | ORAL | Status: DC

## 2012-05-03 NOTE — Assessment & Plan Note (Signed)
Chronic problem.  Tolerating statin w/out difficulty.  Check labs.  Adjust meds prn  

## 2012-05-03 NOTE — Assessment & Plan Note (Signed)
Chronic problem.  No relief w/ Omeprazole.  Insurance won't pay for Nexium (although pt did well).  Will switch to Protonix in hopes of improving sxs.  Pt expressed understanding and is in agreement w/ plan.

## 2012-05-03 NOTE — Patient Instructions (Addendum)
Schedule your complete physical in 6 months We'll notify you of your lab results and make any changes if needed START the Protonix daily for the reflux Call with any questions or concerns Happy Fall!!!

## 2012-05-03 NOTE — Assessment & Plan Note (Signed)
Chronic problem.  Adequate control.  Asymptomatic.  No changes at this time 

## 2012-05-03 NOTE — Progress Notes (Signed)
  Subjective:    Patient ID: Marcia Chan, female    DOB: 07-05-1970, 41 y.o.   MRN: [id number]  HPI HTN- chronic problem, on Coreg, Maxzide.  Adequate control today.  No Cp, SOB, HAs, visual changes, edema.  Hyperlipidemia- chronic problem, on pravastatin.  No abd pain, N/V, myalgias.  GERD- chronic problem, insurance will not cover Nexium.  No relief w/ omeprazole or ranitidine.  Pt reports she is back to bloated, loud abd noises, nocturnal coughing, able to feel food in throat when lying down.  Unable to identify trigger.   Review of Systems For ROS see HPI     Objective:   Physical Exam  Vitals reviewed. Constitutional: She is oriented to person, place, and time. She appears well-developed and well-nourished. No distress.  HENT:  Head: Normocephalic and atraumatic.  Eyes: Conjunctivae normal and EOM are normal. Pupils are equal, round, and reactive to light.  Neck: Normal range of motion. Neck supple. No thyromegaly present.  Cardiovascular: Normal rate, regular rhythm, normal heart sounds and intact distal pulses.   No murmur heard. Pulmonary/Chest: Effort normal and breath sounds normal. No respiratory distress.  Abdominal: Soft. She exhibits no distension. There is no tenderness.  Musculoskeletal: She exhibits no edema.  Lymphadenopathy:    She has no cervical adenopathy.  Neurological: She is alert and oriented to person, place, and time.  Skin: Skin is warm and dry.  Psychiatric: She has a normal mood and affect. Her behavior is normal.          Assessment & Plan:

## 2012-05-08 ENCOUNTER — Telehealth: Payer: Self-pay | Admitting: Family Medicine

## 2012-05-08 NOTE — Telephone Encounter (Signed)
Early refill request from [facility] for: Triamterene 37.5 mg/ hctz 25mg  tabs. Tk one t po q d. Last fill 03-01-12

## 2012-05-09 NOTE — Telephone Encounter (Signed)
Medication last filled on 05-03-12 with 5 refills.

## 2012-05-24 ENCOUNTER — Encounter: Payer: Self-pay | Admitting: Family Medicine

## 2012-05-28 ENCOUNTER — Encounter: Payer: Self-pay | Admitting: Family Medicine

## 2012-07-26 ENCOUNTER — Encounter: Payer: Self-pay | Admitting: Family Medicine

## 2012-07-26 ENCOUNTER — Ambulatory Visit (INDEPENDENT_AMBULATORY_CARE_PROVIDER_SITE_OTHER): Payer: BC Managed Care – PPO | Admitting: Family Medicine

## 2012-07-26 VITALS — BP 122/82 | HR 83 | Temp 98.9°F | Wt 279.0 lb

## 2012-07-26 DIAGNOSIS — J040 Acute laryngitis: Secondary | ICD-10-CM | POA: Insufficient documentation

## 2012-07-26 DIAGNOSIS — J309 Allergic rhinitis, unspecified: Secondary | ICD-10-CM

## 2012-07-26 DIAGNOSIS — J302 Other seasonal allergic rhinitis: Secondary | ICD-10-CM

## 2012-07-26 MED ORDER — MOMETASONE FUROATE 50 MCG/ACT NA SUSP: 2.0000 | Freq: Every day | NASAL | Status: DC

## 2012-07-26 NOTE — Patient Instructions (Addendum)
This is a viral/allergy combo Start the nasal spray- 2 sprays each nostril daily Add Claritin or Zyrtec daily Vocal rest if possible LOTS of fluids! Ibuprofen 3x/day for inflammation Hang in there!!!

## 2012-07-26 NOTE — Assessment & Plan Note (Signed)
Deteriorated.  Start OTC antihistamine and nasal steroid.  Reviewed supportive care and red flags that should prompt return.  Pt expressed understanding and is in agreement w/ plan.

## 2012-07-26 NOTE — Assessment & Plan Note (Signed)
New.  Suspect this is due to copious PND.  Reviewed supportive care and red flags that should prompt return.  Pt expressed understanding and is in agreement w/ plan.

## 2012-07-26 NOTE — Progress Notes (Signed)
  Subjective:    Patient ID: Marcia Chan, female    DOB: [birth year], 42 y.o.   MRN: [id number]  HPI URI- sxs started Tuesday w/ increased PND.  By Thursday had completely lost voice.  Denies sore throat.  No fevers.  + cough at night.  + HA- improved w/ OTC pain reliever.  No ear pain.  + sick contacts.  No N/V/D.   Review of Systems For ROS see HPI     Objective:   Physical Exam  Vitals reviewed. Constitutional: She appears well-developed and well-nourished. No distress.  HENT:  Head: Normocephalic and atraumatic.  Right Ear: Tympanic membrane normal.  Left Ear: Tympanic membrane normal.  Nose: Mucosal edema and rhinorrhea present. Right sinus exhibits no maxillary sinus tenderness and no frontal sinus tenderness. Left sinus exhibits no maxillary sinus tenderness and no frontal sinus tenderness.  Mouth/Throat: Mucous membranes are normal. Posterior oropharyngeal erythema (w/ PND) present.  Eyes: Conjunctivae normal and EOM are normal. Pupils are equal, round, and reactive to light.  Neck: Normal range of motion. Neck supple.  Cardiovascular: Normal rate, regular rhythm and normal heart sounds.   Pulmonary/Chest: Effort normal and breath sounds normal. No respiratory distress. She has no wheezes. She has no rales.  Lymphadenopathy:    She has no cervical adenopathy.          Assessment & Plan:

## 2012-08-27 ENCOUNTER — Telehealth: Payer: Self-pay | Admitting: Family Medicine

## 2012-08-27 NOTE — Telephone Encounter (Signed)
Caller: Chandy/Patient; Phone: [phone]; Reason for Call: Patient states she was seen in office 07/26/12.  States she was given a sample of Nasonex for diagnosis of Allergic Rhinitis.  Patient states she improved with use of Nasonex.  States she completed the sample medication and Rx was not received by the [facility] on [address].  RN reviewed Counselling psychologist Health Record.  Order noted, per Dr.  Beverely Low on 07/26/12, for Mometasone (Nasonex) 50 mcg/ACT nasal spray; place 2 sprays into the nose daily; Dispense 17 Gm.  With 12 refills.   Above Rx called into [facility] on [address] at [phone].  Spoke with Pharmacist/Mitesh.   Patient informed that Rx was called to Pharmacy.

## 2012-11-12 ENCOUNTER — Other Ambulatory Visit: Payer: Self-pay | Admitting: Family Medicine

## 2012-11-12 ENCOUNTER — Encounter: Payer: BC Managed Care – PPO | Admitting: Family Medicine

## 2012-11-12 ENCOUNTER — Telehealth: Payer: Self-pay | Admitting: Family Medicine

## 2012-11-12 MED ORDER — PRAVASTATIN SODIUM 40 MG PO TABS: 40.0000 mg | ORAL_TABLET | Freq: Every day | ORAL | Status: DC

## 2012-11-12 NOTE — Telephone Encounter (Signed)
Rx was already sent to the pharmacy.//AB/CMA 

## 2012-11-12 NOTE — Telephone Encounter (Signed)
Pt needs a refill on pravastatin 40mg  tablets sent to [facility] on [address]. Her appt is June 27th at 8:30am.

## 2012-11-12 NOTE — Telephone Encounter (Signed)
Rx sent to the pharmacy by e-script.//AB/CMA 

## 2012-11-26 ENCOUNTER — Other Ambulatory Visit: Payer: Self-pay | Admitting: Family Medicine

## 2012-11-26 NOTE — Telephone Encounter (Signed)
Med filled.  

## 2012-12-23 ENCOUNTER — Ambulatory Visit (INDEPENDENT_AMBULATORY_CARE_PROVIDER_SITE_OTHER): Payer: BC Managed Care – PPO | Admitting: Family Medicine

## 2012-12-23 ENCOUNTER — Encounter: Payer: Self-pay | Admitting: Family Medicine

## 2012-12-23 VITALS — BP 110/72 | HR 85 | Temp 98.5°F | Wt 272.2 lb

## 2012-12-23 DIAGNOSIS — M545 Low back pain, unspecified: Secondary | ICD-10-CM

## 2012-12-23 DIAGNOSIS — M7710 Lateral epicondylitis, unspecified elbow: Secondary | ICD-10-CM

## 2012-12-23 DIAGNOSIS — M7711 Lateral epicondylitis, right elbow: Secondary | ICD-10-CM

## 2012-12-23 HISTORY — DX: Low back pain, unspecified: M54.50

## 2012-12-23 HISTORY — DX: Lateral epicondylitis, unspecified elbow: M77.10

## 2012-12-23 MED ORDER — CYCLOBENZAPRINE HCL 5 MG PO TABS: 5.0000 mg | ORAL_TABLET | Freq: Three times a day (TID) | ORAL | Status: DC | PRN

## 2012-12-23 MED ORDER — NAPROXEN 500 MG PO TABS: 500.0000 mg | ORAL_TABLET | Freq: Two times a day (BID) | ORAL | Status: DC

## 2012-12-23 NOTE — Assessment & Plan Note (Signed)
New.  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  No red flags on hx or PE.  Start scheduled NSAIDs and muscle relaxer PRN.  Reviewed supportive care and red flags that should prompt return.  Pt expressed understanding and is in agreement w/ plan.

## 2012-12-23 NOTE — Progress Notes (Signed)
  Subjective:    Patient ID: Marcia Chan, female    DOB: December 12, 1970, 42 y.o.   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  Lots of lifting, squatting, pushing/pulling.  Pain is L lumbar.  Difficult to get out of bed.  Will radiate into buttock and thigh.  No numbness/tingling.  Pain improves when sitting still, worse w/ movement.  Took ibuprofen this AM w/ some relief.  No bowel or bladder incontinence, no fevers.  R elbow pain- started at same time as back pain.  Using Biofreeze w/ temporary relief.  Painful to bend arm.   Review of Systems For ROS see HPI     Objective:   Physical Exam  Vitals reviewed. Constitutional: She is oriented to person, place, and time. She appears well-developed and well-nourished. No distress.  Musculoskeletal:  No TTP over R paraspinal muscles or spine itself + TTP over L lumbar paraspinal muscles Good back flexion, + pain w/ extension  + TTP over R lateral epicondyle, pain w/ resisted supination  Neurological: She is alert and oriented to person, place, and time. She has normal reflexes. Coordination normal.  (-) SLR bilaterally  Skin: Skin is warm and dry.          Assessment & Plan:

## 2012-12-23 NOTE — Patient Instructions (Addendum)
This is back pain and tennis elbow Take the Naproxen twice daily x7-10 days (w/ food) and then as needed Use the Flexeril as needed for spasm- will cause drowsiness (no driving) Ice the elbow, heat the back Do some gentle stretching to avoid stiffness NO HEAVY LIFTING! Hang in there!

## 2012-12-23 NOTE — Assessment & Plan Note (Signed)
New.  Reviewed dx and tx w/ pt.  Start scheduled NSAIDs, ice prn.  Reviewed supportive care and red flags that should prompt return.  Pt expressed understanding and is in agreement w/ plan.

## 2012-12-27 ENCOUNTER — Ambulatory Visit (INDEPENDENT_AMBULATORY_CARE_PROVIDER_SITE_OTHER): Payer: BC Managed Care – PPO | Admitting: Family Medicine

## 2012-12-27 ENCOUNTER — Encounter: Payer: Self-pay | Admitting: Family Medicine

## 2012-12-27 VITALS — BP 106/78 | HR 84 | Temp 98.7°F | Ht 67.0 in | Wt 271.4 lb

## 2012-12-27 DIAGNOSIS — Z0000 Encounter for general adult medical examination without abnormal findings: Secondary | ICD-10-CM

## 2012-12-27 LAB — HEPATIC FUNCTION PANEL
ALT: 14 U/L (ref 0–35)
AST: 15 U/L (ref 0–37)
Albumin: 3.8 g/dL (ref 3.5–5.2)
Alkaline Phosphatase: 52 U/L (ref 39–117)
Total Protein: 7.6 g/dL (ref 6.0–8.3)

## 2012-12-27 LAB — CBC WITH DIFFERENTIAL/PLATELET
Eosinophils Relative: 1 % (ref 0.0–5.0)
HCT: 37.4 % (ref 36.0–46.0)
Lymphocytes Relative: 27.8 % (ref 12.0–46.0)
Lymphs Abs: 2.1 10*3/uL (ref 0.7–4.0)
Monocytes Relative: 8.4 % (ref 3.0–12.0)
Neutrophils Relative %: 62.5 % (ref 43.0–77.0)
Platelets: 215 10*3/uL (ref 150.0–400.0)
WBC: 7.7 10*3/uL (ref 4.5–10.5)

## 2012-12-27 LAB — LIPID PANEL
Cholesterol: 160 mg/dL (ref 0–200)
HDL: 39.1 mg/dL (ref >39.00)
VLDL: 17.4 mg/dL (ref 0.0–40.0)

## 2012-12-27 LAB — BASIC METABOLIC PANEL
CO2: 29 mEq/L (ref 19–32)
Chloride: 101 mEq/L (ref 96–112)
Glucose, Bld: 99 mg/dL (ref 70–99)
Potassium: 3.8 mEq/L (ref 3.5–5.1)
Sodium: 135 mEq/L (ref 135–145)

## 2012-12-27 MED ORDER — MOMETASONE FUROATE 50 MCG/ACT NA SUSP: 2.0000 | Freq: Every day | NASAL | Status: DC

## 2012-12-27 MED ORDER — CARVEDILOL 6.25 MG PO TABS: 6.2500 mg | ORAL_TABLET | Freq: Two times a day (BID) | ORAL | Status: DC

## 2012-12-27 MED ORDER — ESOMEPRAZOLE MAGNESIUM 20 MG PO CPDR: 20.0000 mg | DELAYED_RELEASE_CAPSULE | Freq: Every day | ORAL | Status: DC

## 2012-12-27 MED ORDER — PRAVASTATIN SODIUM 40 MG PO TABS: ORAL_TABLET | ORAL | Status: DC

## 2012-12-27 MED ORDER — TRIAMTERENE-HCTZ 37.5-25 MG PO TABS: 1.0000 | ORAL_TABLET | Freq: Every day | ORAL | Status: DC

## 2012-12-27 NOTE — Progress Notes (Signed)
  Subjective:    Patient ID: Marcia Chan, female    DOB: 08/05/1970, 42 y.o.   MRN: [id number]  HPI CPE- UTD on GYN.  No concerns.   Review of Systems Patient reports no vision/ hearing changes, adenopathy,fever, weight change,  persistant/recurrent hoarseness , swallowing issues, chest pain, palpitations, edema, persistant/recurrent cough, hemoptysis, dyspnea (rest/exertional/paroxysmal nocturnal), gastrointestinal bleeding (melena, rectal bleeding), abdominal pain, significant heartburn, bowel changes, GU symptoms (dysuria, hematuria, incontinence), Gyn symptoms (abnormal  bleeding, pain),  syncope, focal weakness, memory loss, numbness & tingling, skin/hair/nail changes, abnormal bruising or bleeding, anxiety, or depression.     Objective:   Physical Exam General Appearance:    Alert, cooperative, no distress, appears stated age  Head:    Normocephalic, without obvious abnormality, atraumatic  Eyes:    PERRL, conjunctiva/corneas clear, EOM's intact, fundi    benign, both eyes  Ears:    Normal TM's and external ear canals, both ears  Nose:   Nares normal, septum midline, mucosa normal, no drainage    or sinus tenderness  Throat:   Lips, mucosa, and tongue normal; teeth and gums normal  Neck:   Supple, symmetrical, trachea midline, no adenopathy;    Thyroid: no enlargement/tenderness/nodules  Back:     Symmetric, no curvature, ROM normal, no CVA tenderness  Lungs:     Clear to auscultation bilaterally, respirations unlabored  Chest Wall:    No tenderness or deformity   Heart:    Regular rate and rhythm, S1 and S2 normal, no murmur, rub   or gallop  Breast Exam:    Deferred to GYN  Abdomen:     Soft, non-tender, bowel sounds active all four quadrants,    no masses, no organomegaly  Genitalia:    Deferred to GYN  Rectal:    Extremities:   Extremities normal, atraumatic, no cyanosis or edema  Pulses:   2+ and symmetric all extremities  Skin:   Skin color, texture, turgor normal,  no rashes or lesions  Lymph nodes:   Cervical, supraclavicular, and axillary nodes normal  Neurologic:   CNII-XII intact, normal strength, sensation and reflexes    throughout          Assessment & Plan:

## 2012-12-27 NOTE — Patient Instructions (Addendum)
Follow up in 6 months to recheck BP and cholesterol Keep up the good work!  You look great! We'll notify you of your lab results and make any changes if needed Call with any questions or concerns Have a great summer!

## 2012-12-27 NOTE — Assessment & Plan Note (Signed)
Pt's PE WNL w/ exception of weight.  UTD on GYN.  Check labs.  Anticipatory guidance provided.

## 2012-12-30 ENCOUNTER — Encounter: Payer: Self-pay | Admitting: *Deleted

## 2013-01-01 ENCOUNTER — Encounter: Payer: Self-pay | Admitting: *Deleted

## 2013-01-01 LAB — VITAMIN D 1,25 DIHYDROXY: Vitamin D3 1, 25 (OH)2: 52 pg/mL

## 2013-01-06 ENCOUNTER — Telehealth: Payer: Self-pay | Admitting: *Deleted

## 2013-01-06 NOTE — Telephone Encounter (Signed)
Prior Auth approved 12-16-12 until 01-06-14, awaiting approval letter, pharmacy faxed.

## 2013-01-24 ENCOUNTER — Encounter: Payer: Self-pay | Admitting: Family Medicine

## 2013-02-28 ENCOUNTER — Other Ambulatory Visit: Payer: Self-pay | Admitting: Family Medicine

## 2013-02-28 NOTE — Telephone Encounter (Signed)
Spoke with pharmacist about this medication. Pharmacist states that this was a duplicate order. SW, CMA

## 2013-05-06 ENCOUNTER — Ambulatory Visit: Payer: BC Managed Care – PPO

## 2013-05-06 ENCOUNTER — Ambulatory Visit (INDEPENDENT_AMBULATORY_CARE_PROVIDER_SITE_OTHER): Payer: BC Managed Care – PPO

## 2013-05-06 DIAGNOSIS — Z23 Encounter for immunization: Secondary | ICD-10-CM

## 2013-05-12 LAB — HM MAMMOGRAPHY: HM Mammogram: NORMAL

## 2013-05-13 ENCOUNTER — Encounter: Payer: Self-pay | Admitting: Family Medicine

## 2013-05-23 ENCOUNTER — Other Ambulatory Visit: Payer: Self-pay | Admitting: Family Medicine

## 2013-05-23 NOTE — Telephone Encounter (Signed)
Med filled.  

## 2013-06-07 ENCOUNTER — Encounter (HOSPITAL_BASED_OUTPATIENT_CLINIC_OR_DEPARTMENT_OTHER): Payer: Self-pay | Admitting: Emergency Medicine

## 2013-06-07 ENCOUNTER — Emergency Department (HOSPITAL_BASED_OUTPATIENT_CLINIC_OR_DEPARTMENT_OTHER)
Admission: EM | Admit: 2013-06-07 | Discharge: 2013-06-07 | Disposition: A | Discharge status: Home / Self Care | Payer: BC Managed Care – PPO | Attending: Emergency Medicine | Admitting: Emergency Medicine

## 2013-06-07 ENCOUNTER — Emergency Department (HOSPITAL_BASED_OUTPATIENT_CLINIC_OR_DEPARTMENT_OTHER): Payer: BC Managed Care – PPO

## 2013-06-07 DIAGNOSIS — Z79899 Other long term (current) drug therapy: Secondary | ICD-10-CM | POA: Insufficient documentation

## 2013-06-07 DIAGNOSIS — R52 Pain, unspecified: Secondary | ICD-10-CM | POA: Insufficient documentation

## 2013-06-07 DIAGNOSIS — R0981 Nasal congestion: Secondary | ICD-10-CM

## 2013-06-07 DIAGNOSIS — J3489 Other specified disorders of nose and nasal sinuses: Secondary | ICD-10-CM | POA: Insufficient documentation

## 2013-06-07 DIAGNOSIS — R42 Dizziness and giddiness: Secondary | ICD-10-CM | POA: Insufficient documentation

## 2013-06-07 DIAGNOSIS — R51 Headache: Secondary | ICD-10-CM | POA: Insufficient documentation

## 2013-06-07 DIAGNOSIS — K219 Gastro-esophageal reflux disease without esophagitis: Secondary | ICD-10-CM | POA: Insufficient documentation

## 2013-06-07 DIAGNOSIS — I10 Essential (primary) hypertension: Secondary | ICD-10-CM | POA: Insufficient documentation

## 2013-06-07 DIAGNOSIS — E785 Hyperlipidemia, unspecified: Secondary | ICD-10-CM | POA: Insufficient documentation

## 2013-06-07 DIAGNOSIS — IMO0002 Reserved for concepts with insufficient information to code with codable children: Secondary | ICD-10-CM | POA: Insufficient documentation

## 2013-06-07 HISTORY — DX: Dizziness and giddiness: R42

## 2013-06-07 MED ORDER — HYDROCODONE-ACETAMINOPHEN 5-325 MG PO TABS
1.0000 | ORAL_TABLET | Freq: Once | ORAL | Status: AC
  Administered 2013-06-07: 1 via ORAL
  Filled 2013-06-07: qty 2

## 2013-06-07 MED ORDER — FLUTICASONE PROPIONATE 50 MCG/ACT NA SUSP
NASAL | Status: DC
Start: 2013-06-07 — End: 2014-04-17

## 2013-06-07 MED ORDER — ONDANSETRON 4 MG PO TBDP
4.0000 mg | ORAL_TABLET | Freq: Once | ORAL | Status: AC
  Administered 2013-06-07: 4 mg via ORAL
  Filled 2013-06-07: qty 1

## 2013-06-07 MED ORDER — MECLIZINE HCL 25 MG PO TABS: 25.0000 mg | ORAL_TABLET | Freq: Three times a day (TID) | ORAL | Status: DC | PRN

## 2013-06-07 NOTE — ED Notes (Signed)
Patient reports for the past week has been having general body aches and dizziness. This am while driving became hot and diaphoretic and increased dizziness. States that EMS was called and she denies transport. Alert and oriented, steady gait, ambulatory. dizziness worse with change in position

## 2013-06-07 NOTE — ED Provider Notes (Signed)
CSN: [id number]     Arrival date & time 06/07/13  1205 History   First MD Initiated Contact with Patient 06/07/13 1442     Chief Complaint  Patient presents with  . Dizziness  . Generalized Body Aches    HPI  Patient presents with nasal congestion and some facial pressure for the last 4-5 days. Driving today for an episode of dizziness and a headache. States he felt dizzy and abrasion to pull over. Described as spinning and also a pounding headache and  facial pain.  Mild nausea. No vomiting. No GI symptoms. No palpitations or chest pain or other symptoms. An episode of vertigo over a year ago that resolved spontaneously  Past Medical History  Diagnosis Date  . Hyperlipidemia   . Hypertension   . GERD (gastroesophageal reflux disease)   . Vertigo    History reviewed. No pertinent past surgical history. Family History  Problem Relation Age of Onset  . Arthritis Mother     OA/DJD  . Heart disease Mother   . Diabetes Mother   . Hypertension Mother    History  Substance Use Topics  . Smoking status: Never Smoker   . Smokeless tobacco: Not on file  . Alcohol Use: No   OB History   Grav Para Term Preterm Abortions TAB SAB Ect Mult Living                 Review of Systems  HENT: Positive for congestion, rhinorrhea and sinus pressure.   Neurological: Positive for dizziness and headaches.    Allergies  Bextra  Home Medications   Current Outpatient Rx  Name  Route  Sig  Dispense  Refill  . carvedilol (COREG) 6.25 MG tablet      TAKE 1 TABLET BY MOUTH TWICE A DAY WITH A MEAL   180 tablet   0   . esomeprazole (NEXIUM) 20 MG capsule   Oral   Take 1 capsule (20 mg total) by mouth daily before breakfast.   90 capsule   3   . fluticasone (FLONASE) 50 MCG/ACT nasal spray      1 spray each nares bid   10 g   1   . meclizine (ANTIVERT) 25 MG tablet   Oral   Take 1 tablet (25 mg total) by mouth 3 (three) times daily as needed for dizziness.   28 tablet   0   .  pravastatin (PRAVACHOL) 40 MG tablet      TAKE 1 TABLET BY MOUTH DAILY   90 tablet   3   . triamterene-hydrochlorothiazide (MAXZIDE-25) 37.5-25 MG per tablet   Oral   Take 1 tablet by mouth daily.   90 tablet   3    BP 113/65  Pulse 64  Temp(Src) 99.1 F (37.3 C) (Oral)  Resp 18  Ht 5\' 6"  (1.676 m)  Wt 282 lb (127.914 kg)  BMI 45.54 kg/m2  SpO2 99% Physical Exam  Constitutional: She is oriented to person, place, and time. She appears well-developed and well-nourished. No distress.  HENT:  Head: Normocephalic.  Tender over the maxilla. Nasal congestion. Suprax benign. Facial musculature intact without. No vertigo 2 extraocular movements. No abnormalities with extraocular movements. TMs normal.  Eyes: Conjunctivae are normal. Pupils are equal, round, and reactive to light. No scleral icterus.  Neck: Normal range of motion. Neck supple. No thyromegaly present.  Cardiovascular: Normal rate and regular rhythm.  Exam reveals no gallop and no friction rub.   No murmur heard.  Pulmonary/Chest: Effort normal and breath sounds normal. No respiratory distress. She has no wheezes. She has no rales.  Abdominal: Soft. Bowel sounds are normal. She exhibits no distension. There is no tenderness. There is no rebound.  Musculoskeletal: Normal range of motion.  Neurological: She is alert and oriented to person, place, and time.  Skin: Skin is warm and dry. No rash noted.  Psychiatric: She has a normal mood and affect. Her behavior is normal.    ED Course  Procedures (including critical care time) Labs Review Labs Reviewed - No data to display Imaging Review Ct Head Wo Contrast  06/07/2013   CLINICAL DATA:  Headache, neck pain, sinus pressure  EXAM: CT HEAD WITHOUT CONTRAST  TECHNIQUE: Contiguous axial images were obtained from the base of the skull through the vertex without intravenous contrast.  COMPARISON:  02/23/2011.  FINDINGS: No skull fracture is noted. Paranasal sinuses and mastoid  air cells are unremarkable. No intracranial hemorrhage, mass effect or midline shift. No hydrocephalus. The gray and white-matter differentiation is preserved. No acute cortical infarction. No mass lesion is noted on this unenhanced scan.  IMPRESSION: No acute intracranial abnormality.   Electronically Signed   By: Natasha Mead M.D.   On: 06/07/2013 15:23   Ct Maxillofacial Ltd Wo Cm  06/07/2013   CLINICAL DATA:  Headache, dizziness, sinus pressure  EXAM: CT PARANASAL SINUS WITHOUT CONTRAST  TECHNIQUE: Multidetector CT images of the paranasal sinuses were obtained using the standard protocol without intravenous contrast.  COMPARISON:  None.  FINDINGS: Axial images of paranasal sinuses submitted. No paranasal sinuses mucosal thickening or air-fluid levels. The nasal septum is midline. Bilateral semilunar canal appears patent. The nasal turbinates are unremarkable.  IMPRESSION: No paranasal sinuses mucosal thickening or air-fluid levels. Bilateral semilunar canal appears patent.   Electronically Signed   By: Natasha Mead M.D.   On: 06/07/2013 15:25    EKG Interpretation   None       MDM   1. Nasal congestion   2. Vertigo    Patient is normal CT of her head in her sinuses. Her symptoms are most suggestive of nasal congestion and a breast for infection and some secondary vertigo. Clinically no otitis. She appears well. Asymptomatic here.    Roney Marion, MD 06/07/13 [phone]

## 2013-06-09 ENCOUNTER — Encounter: Payer: Self-pay | Admitting: Family Medicine

## 2013-06-09 ENCOUNTER — Ambulatory Visit (INDEPENDENT_AMBULATORY_CARE_PROVIDER_SITE_OTHER): Payer: BC Managed Care – PPO | Admitting: Family Medicine

## 2013-06-09 VITALS — BP 130/90 | HR 87 | Temp 98.4°F | Ht 67.0 in | Wt 269.0 lb

## 2013-06-09 DIAGNOSIS — R200 Anesthesia of skin: Secondary | ICD-10-CM | POA: Insufficient documentation

## 2013-06-09 DIAGNOSIS — R209 Unspecified disturbances of skin sensation: Secondary | ICD-10-CM

## 2013-06-09 HISTORY — DX: Anesthesia of skin: R20.0

## 2013-06-09 MED ORDER — NAPROXEN 500 MG PO TABS: 500.0000 mg | ORAL_TABLET | Freq: Two times a day (BID) | ORAL | Status: AC

## 2013-06-09 NOTE — Progress Notes (Signed)
Pre visit review using our clinic review tool, if applicable. No additional management support is needed unless otherwise documented below in the visit note. 

## 2013-06-09 NOTE — Patient Instructions (Signed)
Follow up as needed Please call next week if your symptoms are not better Start the Naproxen twice daily- take w/ food Call with any questions or concerns Hang in there! Happy Holidays!!

## 2013-06-09 NOTE — Progress Notes (Signed)
   Subjective:    Patient ID: Marcia Chan, female    DOB: [birth year], 42 y.o.   MRN: [id number]  HPI Pre visit review using our clinic review tool, if applicable. No additional management support is needed unless otherwise documented below in the visit note.  Leg tingling- has sensation of 'pulsing electric shock'.  sxs started 2 weeks ago.  Initially bilateral.  Now predominately L.  sxs are intermittent.  No back pain.  No known injury, no heavy lifting- did excessive walking just prior to sxs starting.  No weakness of legs.   Review of Systems For ROS see HPI     Objective:   Physical Exam  Vitals reviewed. Constitutional: She is oriented to person, place, and time. She appears well-developed and well-nourished. No distress.  Musculoskeletal: She exhibits no edema and no tenderness.  (-) SLR bilaterally No TTP over spine, lumbar paraspinal muscles, or sciatic notch bilaterally  Neurological: She is alert and oriented to person, place, and time. She has normal reflexes. No cranial nerve deficit.  Skin: Skin is warm and dry.          Assessment & Plan:

## 2013-06-10 NOTE — Assessment & Plan Note (Addendum)
New.  Suspect this is nerve mediated due to inflammation.  Start scheduled NSAIDs.  If no improvement will need referral to neurology for formal evaluation and tx.  Reviewed supportive care and red flags that should prompt return.  Pt expressed understanding and is in agreement w/ plan.

## 2013-06-30 ENCOUNTER — Ambulatory Visit: Payer: BC Managed Care – PPO | Admitting: Family Medicine

## 2013-07-07 ENCOUNTER — Ambulatory Visit (INDEPENDENT_AMBULATORY_CARE_PROVIDER_SITE_OTHER): Payer: BC Managed Care – PPO | Admitting: Family Medicine

## 2013-07-07 VITALS — BP 122/80 | HR 74 | Temp 98.6°F | Wt 274.8 lb

## 2013-07-07 DIAGNOSIS — I10 Essential (primary) hypertension: Secondary | ICD-10-CM

## 2013-07-07 DIAGNOSIS — E785 Hyperlipidemia, unspecified: Secondary | ICD-10-CM

## 2013-07-07 LAB — LIPID PANEL
Cholesterol: 161 mg/dL (ref 0–200)
HDL: 38.1 mg/dL — AB (ref >39.00)
LDL Cholesterol: 105 mg/dL — ABNORMAL HIGH (ref 0–99)
TRIGLYCERIDES: 89 mg/dL (ref 0.0–149.0)
Total CHOL/HDL Ratio: 4
VLDL: 17.8 mg/dL (ref 0.0–40.0)

## 2013-07-07 LAB — HEPATIC FUNCTION PANEL
ALBUMIN: 4.2 g/dL (ref 3.5–5.2)
ALK PHOS: 51 U/L (ref 39–117)
ALT: 14 U/L (ref 0–35)
AST: 15 U/L (ref 0–37)
Bilirubin, Direct: 0 mg/dL (ref 0.0–0.3)
TOTAL PROTEIN: 7.8 g/dL (ref 6.0–8.3)
Total Bilirubin: 0.8 mg/dL (ref 0.3–1.2)

## 2013-07-07 LAB — BASIC METABOLIC PANEL
BUN: 10 mg/dL (ref 6–23)
CALCIUM: 9.2 mg/dL (ref 8.4–10.5)
CO2: 31 meq/L (ref 19–32)
Chloride: 102 mEq/L (ref 96–112)
Creatinine, Ser: 0.7 mg/dL (ref 0.4–1.2)
GFR: 121.21 mL/min (ref >60.00)
GLUCOSE: 78 mg/dL (ref 70–99)
Potassium: 3.7 mEq/L (ref 3.5–5.1)
SODIUM: 138 meq/L (ref 135–145)

## 2013-07-07 MED ORDER — TRIAMTERENE-HCTZ 37.5-25 MG PO TABS: 1.0000 | ORAL_TABLET | Freq: Every day | ORAL | Status: DC

## 2013-07-07 MED ORDER — CARVEDILOL 6.25 MG PO TABS: ORAL_TABLET | ORAL | Status: DC

## 2013-07-07 MED ORDER — PRAVASTATIN SODIUM 40 MG PO TABS: ORAL_TABLET | ORAL | Status: DC

## 2013-07-07 NOTE — Assessment & Plan Note (Signed)
Chronic problem.  Well controlled.  Asymptomatic.  Check labs.  No anticipated changes. 

## 2013-07-07 NOTE — Patient Instructions (Signed)
Schedule your complete physical in 6 months We'll notify you of your lab results and make any changes if needed Keep up the good work!  You can lose those 5 lbs! Call with any questions or concerns Happy New Year!!!

## 2013-07-07 NOTE — Assessment & Plan Note (Signed)
Chronic problem.  Tolerating statin w/out difficulty.  Check labs.  Adjust meds prn  

## 2013-07-07 NOTE — Progress Notes (Signed)
   Subjective:    Patient ID: Marcia Chan, female    DOB: 04/24/1971, 43 y.o.   MRN: [id number]  HPI HTN- chronic problem, on Coreg and Maxzide.  Denies CP, SOB, HAs, visual changes, edema.  Hyperlipidemia- chronic problem, on Pravastatin.  Denies abd pain, N/V, myalgias.   Review of Systems For ROS see HPI     Objective:   Physical Exam  Vitals reviewed. Constitutional: She is oriented to person, place, and time. She appears well-developed and well-nourished. No distress.  HENT:  Head: Normocephalic and atraumatic.  Eyes: Conjunctivae and EOM are normal. Pupils are equal, round, and reactive to light.  Neck: Normal range of motion. Neck supple. No thyromegaly present.  Cardiovascular: Normal rate, regular rhythm, normal heart sounds and intact distal pulses.   No murmur heard. Pulmonary/Chest: Effort normal and breath sounds normal. No respiratory distress.  Abdominal: Soft. She exhibits no distension. There is no tenderness.  Musculoskeletal: She exhibits no edema.  Lymphadenopathy:    She has no cervical adenopathy.  Neurological: She is alert and oriented to person, place, and time.  Skin: Skin is warm and dry.  Psychiatric: She has a normal mood and affect. Her behavior is normal.          Assessment & Plan:

## 2013-07-09 ENCOUNTER — Encounter: Payer: Self-pay | Admitting: General Practice

## 2013-07-09 LAB — HM PAP SMEAR: HM Pap smear: NORMAL

## 2013-12-30 ENCOUNTER — Ambulatory Visit: Payer: BC Managed Care – PPO | Admitting: Family Medicine

## 2014-01-06 ENCOUNTER — Other Ambulatory Visit: Payer: Self-pay | Admitting: Family Medicine

## 2014-01-06 ENCOUNTER — Ambulatory Visit (INDEPENDENT_AMBULATORY_CARE_PROVIDER_SITE_OTHER): Payer: BC Managed Care – PPO | Admitting: Family Medicine

## 2014-01-06 ENCOUNTER — Encounter: Payer: Self-pay | Admitting: Family Medicine

## 2014-01-06 VITALS — BP 124/78 | HR 76 | Temp 98.0°F | Resp 16 | Wt 272.0 lb

## 2014-01-06 DIAGNOSIS — E785 Hyperlipidemia, unspecified: Secondary | ICD-10-CM

## 2014-01-06 DIAGNOSIS — I10 Essential (primary) hypertension: Secondary | ICD-10-CM

## 2014-01-06 DIAGNOSIS — H539 Unspecified visual disturbance: Secondary | ICD-10-CM

## 2014-01-06 DIAGNOSIS — M722 Plantar fascial fibromatosis: Secondary | ICD-10-CM

## 2014-01-06 HISTORY — DX: Plantar fascial fibromatosis: M72.2

## 2014-01-06 LAB — LIPID PANEL
CHOL/HDL RATIO: 4
Cholesterol: 147 mg/dL (ref 0–200)
HDL: 38.8 mg/dL — ABNORMAL LOW (ref >39.00)
LDL CALC: 100 mg/dL — AB (ref 0–99)
NONHDL: 108.2
Triglycerides: 42 mg/dL (ref 0.0–149.0)
VLDL: 8.4 mg/dL (ref 0.0–40.0)

## 2014-01-06 LAB — HEPATIC FUNCTION PANEL
ALK PHOS: 49 U/L (ref 39–117)
ALT: 17 U/L (ref 0–35)
AST: 16 U/L (ref 0–37)
Albumin: 3.7 g/dL (ref 3.5–5.2)
BILIRUBIN TOTAL: 0.6 mg/dL (ref 0.2–1.2)
Bilirubin, Direct: 0.1 mg/dL (ref 0.0–0.3)
TOTAL PROTEIN: 7.2 g/dL (ref 6.0–8.3)

## 2014-01-06 LAB — BASIC METABOLIC PANEL
BUN: 8 mg/dL (ref 6–23)
CALCIUM: 8.9 mg/dL (ref 8.4–10.5)
CHLORIDE: 101 meq/L (ref 96–112)
CO2: 30 meq/L (ref 19–32)
CREATININE: 0.7 mg/dL (ref 0.4–1.2)
GFR: 115.05 mL/min (ref >60.00)
GLUCOSE: 99 mg/dL (ref 70–99)
Potassium: 3.7 mEq/L (ref 3.5–5.1)
Sodium: 137 mEq/L (ref 135–145)

## 2014-01-06 MED ORDER — MELOXICAM 15 MG PO TABS: 15.0000 mg | ORAL_TABLET | Freq: Every day | ORAL | Status: DC

## 2014-01-06 NOTE — Progress Notes (Signed)
   Subjective:    Patient ID: Marcia Chan, female    DOB: 10/05/1970, 43 y.o.   MRN: [id number]  HPI HTN- chronic problem, well controlled on Coreg and Triamterene HCTZ.  No CP, SOB, HAs, edema, visual changes.  Hyperlipidemia- chronic problem, on Pravastatin.  No abd pain, N/V, myalgias  Plantar fasciitis- L foot, ongoing issue.  Improves when pt wears sneakers w/ insoles.  Now wearing flip-flops and walking barefoot.   Review of Systems For ROS see HPI     Objective:   Physical Exam  Vitals reviewed. Constitutional: She is oriented to person, place, and time. She appears well-developed and well-nourished. No distress.  HENT:  Head: Normocephalic and atraumatic.  Eyes: Conjunctivae and EOM are normal. Pupils are equal, round, and reactive to light.  Neck: Normal range of motion. Neck supple. No thyromegaly present.  Cardiovascular: Normal rate, regular rhythm, normal heart sounds and intact distal pulses.   No murmur heard. Pulmonary/Chest: Effort normal and breath sounds normal. No respiratory distress.  Abdominal: Soft. She exhibits no distension. There is no tenderness.  Musculoskeletal: She exhibits tenderness (over insertion of plantar fascia on L). She exhibits no edema.  Lymphadenopathy:    She has no cervical adenopathy.  Neurological: She is alert and oriented to person, place, and time.  Skin: Skin is warm and dry.  Psychiatric: She has a normal mood and affect. Her behavior is normal.          Assessment & Plan:

## 2014-01-06 NOTE — Assessment & Plan Note (Signed)
Chronic problem.  Tolerating statin w/o difficulty.  Check labs.  Adjust meds prn  

## 2014-01-06 NOTE — Assessment & Plan Note (Signed)
New.  Reviewed dx and tx plan w/ pt.  Ice, NSAIDs, stretching (demonstrated to pt), and need for proper footwear discussed.  Will follow.

## 2014-01-06 NOTE — Assessment & Plan Note (Signed)
Chronic problem.  Well controlled.  Asymptomatic.  Check labs.  No anticipated med changes. 

## 2014-01-06 NOTE — Progress Notes (Signed)
Pre visit review using our clinic review tool, if applicable. No additional management support is needed unless otherwise documented below in the visit note. 

## 2014-01-06 NOTE — Patient Instructions (Signed)
Schedule your complete physical in 6 months We'll notify you of your lab results and make any changes if needed ICE your foot using the frozen water bottle technique Start the Mobic once daily w/ food for pain and inflammation Call with any questions or concerns Have a great summer!

## 2014-01-06 NOTE — Telephone Encounter (Signed)
Med filled.  

## 2014-01-07 ENCOUNTER — Telehealth: Payer: Self-pay | Admitting: Family Medicine

## 2014-01-07 ENCOUNTER — Encounter: Payer: Self-pay | Admitting: General Practice

## 2014-01-07 NOTE — Telephone Encounter (Signed)
Relevant patient education assigned to patient using Emmi. ° °

## 2014-01-13 ENCOUNTER — Encounter: Payer: Self-pay | Admitting: Family Medicine

## 2014-01-23 ENCOUNTER — Telehealth: Payer: Self-pay | Admitting: Family Medicine

## 2014-01-23 NOTE — Telephone Encounter (Signed)
Pt states that insurance company is unable to cover Nexium 20 mg, and therefore she has been buying it over the counter.  Pt was told by her insurance company that they are able to cover a 90 supply esomeprazole (Nexium) 22.3 mg.  Pt wants to know if it's okay for her switch?    Pt states that she is also in the process of getting reimbursed for the Nexium capsules that she has purchased over the counter and was told that she would need proof to show that the medication was actually prescribed for her. The form that she is completing states that she needs the following information to be included in the packet:    Physician's name Drug number Name of Drug and Strength Frequency Prescription number  Please advise.

## 2014-01-23 NOTE — Telephone Encounter (Signed)
Tried to send prescription via escribe and the strength for Nexium was not listed.  Called [facility] to try and call it in, and the pharmacy tech stated that the strength is offered over the counter.  Pt was made aware and stated understanding.  She was also made aware that the documentation needed for reimbursement has been mailed to her.  No further needs at this time.

## 2014-01-23 NOTE — Telephone Encounter (Signed)
Caller name: Lavenia AtlasFranceshca  Call back number:[phone]   Reason for call:  Pt has questions about Rx esomeprazole (NEXIUM) 20 MG capsule.  Would like to speak with Rn or CMA

## 2014-01-23 NOTE — Telephone Encounter (Signed)
Ok to switch to 22.3mg  daily, #30, 6 refills and please provided needed documentation that pt has been taking this regularly

## 2014-01-26 ENCOUNTER — Other Ambulatory Visit: Payer: Self-pay | Admitting: General Practice

## 2014-01-26 ENCOUNTER — Telehealth: Payer: Self-pay | Admitting: Family Medicine

## 2014-01-26 ENCOUNTER — Other Ambulatory Visit: Payer: Self-pay | Admitting: Family Medicine

## 2014-01-26 MED ORDER — NONFORMULARY OR COMPOUNDED ITEM: Status: DC

## 2014-01-26 MED ORDER — ESOMEPRAZOLE MAGNESIUM 40 MG PO CPDR: 40.0000 mg | DELAYED_RELEASE_CAPSULE | Freq: Every day | ORAL | Status: DC

## 2014-01-26 NOTE — Telephone Encounter (Signed)
We do not have 22.3mg  listed on our system.  Please discuss w/ pt if sending script for 40mg  is ok

## 2014-01-26 NOTE — Telephone Encounter (Signed)
Pt states she her re-fill called into [id number] - [place], [place] - [address] needs a RX order on  Nexium 22.3 OTC.  The 40mg  Nexium is incorrect per pt.

## 2014-01-26 NOTE — Telephone Encounter (Signed)
Caller name: Jackelyn PolingFranchesca  Call back [phone]  [facility] [place] [place]  Reason for call:  Pt was called on Friday about Nexium; call [facility] [address] in [place] to send in the script.    It is for the 22.3 mg Nexium.

## 2014-01-26 NOTE — Telephone Encounter (Signed)
Med filled. Pt had advised on earlier phone call that she could only receive a #30 day supply per her insurance.

## 2014-01-26 NOTE — Telephone Encounter (Signed)
Med filled, spoke with pt and advised.

## 2014-01-26 NOTE — Telephone Encounter (Signed)
Sent in a new prescription for #90 of OTC medication.

## 2014-01-26 NOTE — Telephone Encounter (Signed)
Please advise the pt is currently on nexium 20mg , I cannot find 22.3 in the system.

## 2014-02-18 ENCOUNTER — Other Ambulatory Visit: Payer: Self-pay | Admitting: Family Medicine

## 2014-02-18 NOTE — Telephone Encounter (Signed)
Med filled.  

## 2014-04-06 ENCOUNTER — Encounter: Payer: BC Managed Care – PPO | Admitting: Family Medicine

## 2014-04-17 ENCOUNTER — Encounter: Payer: Self-pay | Admitting: Family Medicine

## 2014-04-17 ENCOUNTER — Other Ambulatory Visit: Payer: Self-pay | Admitting: Family Medicine

## 2014-04-17 ENCOUNTER — Other Ambulatory Visit: Payer: Self-pay | Admitting: General Practice

## 2014-04-17 ENCOUNTER — Ambulatory Visit (INDEPENDENT_AMBULATORY_CARE_PROVIDER_SITE_OTHER): Payer: BC Managed Care – PPO | Admitting: Family Medicine

## 2014-04-17 VITALS — BP 122/76 | HR 75 | Temp 98.6°F | Resp 16 | Ht 67.75 in | Wt 270.5 lb

## 2014-04-17 DIAGNOSIS — Z0000 Encounter for general adult medical examination without abnormal findings: Secondary | ICD-10-CM

## 2014-04-17 DIAGNOSIS — Z23 Encounter for immunization: Secondary | ICD-10-CM

## 2014-04-17 LAB — BASIC METABOLIC PANEL
BUN: 11 mg/dL (ref 6–23)
CO2: 32 mEq/L (ref 19–32)
CREATININE: 0.7 mg/dL (ref 0.4–1.2)
Calcium: 9.1 mg/dL (ref 8.4–10.5)
Chloride: 103 mEq/L (ref 96–112)
GFR: 109.55 mL/min (ref >60.00)
GLUCOSE: 100 mg/dL — AB (ref 70–99)
POTASSIUM: 4.5 meq/L (ref 3.5–5.1)
Sodium: 137 mEq/L (ref 135–145)

## 2014-04-17 LAB — CBC WITH DIFFERENTIAL/PLATELET
BASOS ABS: 0 10*3/uL (ref 0.0–0.1)
Basophils Relative: 0.3 % (ref 0.0–3.0)
EOS PCT: 2.2 % (ref 0.0–5.0)
Eosinophils Absolute: 0.1 10*3/uL (ref 0.0–0.7)
HCT: 37.4 % (ref 36.0–46.0)
Hemoglobin: 12.3 g/dL (ref 12.0–15.0)
LYMPHS PCT: 30.7 % (ref 12.0–46.0)
Lymphs Abs: 1.6 10*3/uL (ref 0.7–4.0)
MCHC: 33 g/dL (ref 30.0–36.0)
MCV: 89.3 fl (ref 78.0–100.0)
MONOS PCT: 9.6 % (ref 3.0–12.0)
Monocytes Absolute: 0.5 10*3/uL (ref 0.1–1.0)
Neutro Abs: 3.1 10*3/uL (ref 1.4–7.7)
Neutrophils Relative %: 57.2 % (ref 43.0–77.0)
PLATELETS: 214 10*3/uL (ref 150.0–400.0)
RBC: 4.19 Mil/uL (ref 3.87–5.11)
RDW: 12.9 % (ref 11.5–15.5)
WBC: 5.4 10*3/uL (ref 4.0–10.5)

## 2014-04-17 LAB — HEPATIC FUNCTION PANEL
ALK PHOS: 47 U/L (ref 39–117)
ALT: 13 U/L (ref 0–35)
AST: 17 U/L (ref 0–37)
Albumin: 3.2 g/dL — ABNORMAL LOW (ref 3.5–5.2)
Bilirubin, Direct: 0.1 mg/dL (ref 0.0–0.3)
TOTAL PROTEIN: 7.3 g/dL (ref 6.0–8.3)
Total Bilirubin: 0.8 mg/dL (ref 0.2–1.2)

## 2014-04-17 LAB — VITAMIN D 25 HYDROXY (VIT D DEFICIENCY, FRACTURES): VITD: 15.04 ng/mL — AB (ref 30.00–100.00)

## 2014-04-17 LAB — TSH: TSH: 0.85 u[IU]/mL (ref 0.35–4.50)

## 2014-04-17 LAB — LIPID PANEL
CHOLESTEROL: 140 mg/dL (ref 0–200)
HDL: 28.9 mg/dL — ABNORMAL LOW (ref >39.00)
LDL Cholesterol: 100 mg/dL — ABNORMAL HIGH (ref 0–99)
NonHDL: 111.1
TRIGLYCERIDES: 54 mg/dL (ref 0.0–149.0)
Total CHOL/HDL Ratio: 5
VLDL: 10.8 mg/dL (ref 0.0–40.0)

## 2014-04-17 MED ORDER — MELOXICAM 15 MG PO TABS: ORAL_TABLET | ORAL | Status: DC

## 2014-04-17 MED ORDER — FLUTICASONE PROPIONATE 50 MCG/ACT NA SUSP: NASAL | Status: DC

## 2014-04-17 MED ORDER — CARVEDILOL 6.25 MG PO TABS: ORAL_TABLET | ORAL | Status: DC

## 2014-04-17 MED ORDER — ESOMEPRAZOLE MAGNESIUM 40 MG PO CPDR: DELAYED_RELEASE_CAPSULE | ORAL | Status: DC

## 2014-04-17 MED ORDER — PRAVASTATIN SODIUM 40 MG PO TABS: ORAL_TABLET | ORAL | Status: DC

## 2014-04-17 MED ORDER — TRIAMTERENE-HCTZ 37.5-25 MG PO TABS: ORAL_TABLET | ORAL | Status: DC

## 2014-04-17 MED ORDER — VITAMIN D (ERGOCALCIFEROL) 1.25 MG (50000 UNIT) PO CAPS: 50000.0000 [IU] | ORAL_CAPSULE | ORAL | Status: DC

## 2014-04-17 MED ORDER — MECLIZINE HCL 25 MG PO TABS: 25.0000 mg | ORAL_TABLET | Freq: Three times a day (TID) | ORAL | Status: DC | PRN

## 2014-04-17 NOTE — Progress Notes (Signed)
   Subjective:    Patient ID: Marcia Chan, female    DOB: 01/13/1971, 43 y.o.   MRN: [id number]  HPI CPE- UTD on pap, mammo.  Too young for colonoscopy.   Review of Systems Patient reports no vision/ hearing changes, adenopathy,fever, weight change,  persistant/recurrent hoarseness , swallowing issues, chest pain, palpitations, edema, persistant/recurrent cough, hemoptysis, dyspnea (rest/exertional/paroxysmal nocturnal), gastrointestinal bleeding (melena, rectal bleeding), abdominal pain, significant heartburn, bowel changes, GU symptoms (dysuria, hematuria, incontinence), Gyn symptoms (abnormal  bleeding, pain),  syncope, focal weakness, memory loss, numbness & tingling, skin/hair/nail changes, abnormal bruising or bleeding, anxiety, or depression.     Objective:   Physical Exam General Appearance:    Alert, cooperative, no distress, appears stated age, obese  Head:    Normocephalic, without obvious abnormality, atraumatic  Eyes:    PERRL, conjunctiva/corneas clear, EOM's intact, fundi    benign, both eyes  Ears:    Normal TM's and external ear canals, both ears  Nose:   Nares normal, septum midline, mucosa normal, no drainage    or sinus tenderness  Throat:   Lips, mucosa, and tongue normal; teeth and gums normal  Neck:   Supple, symmetrical, trachea midline, no adenopathy;    Thyroid: no enlargement/tenderness/nodules  Back:     Symmetric, no curvature, ROM normal, no CVA tenderness  Lungs:     Clear to auscultation bilaterally, respirations unlabored  Chest Wall:    No tenderness or deformity   Heart:    Regular rate and rhythm, S1 and S2 normal, no murmur, rub   or gallop  Breast Exam:    Deferred to GYN  Abdomen:     Soft, non-tender, bowel sounds active all four quadrants,    no masses, no organomegaly  Genitalia:    Deferred to GYN  Rectal:    Extremities:   Extremities normal, atraumatic, no cyanosis or edema  Pulses:   2+ and symmetric all extremities  Skin:   Skin  color, texture, turgor normal, no rashes or lesions  Lymph nodes:   Cervical, supraclavicular, and axillary nodes normal  Neurologic:   CNII-XII intact, normal strength, sensation and reflexes    throughout          Assessment & Plan:

## 2014-04-17 NOTE — Patient Instructions (Signed)
Follow up in 6 months to recheck BP and cholesterol We'll notify you of your lab results and make any changes if needed Keep up the good work on healthy diet and regular exercise Call with any questions or concerns Happy Fall!!! 

## 2014-04-17 NOTE — Assessment & Plan Note (Signed)
Pt's PE WNL w/ exception of obesity.  Check labs.  UTD on GYN.  Anticipatory guidance provided.  

## 2014-04-17 NOTE — Telephone Encounter (Signed)
Med filled.  

## 2014-04-17 NOTE — Addendum Note (Signed)
Addended by: Jackson LatinoYLER, Shantil Vallejo L on: 04/17/2014 11:28 AM   Modules accepted: Orders

## 2014-04-17 NOTE — Progress Notes (Signed)
Pre visit review using our clinic review tool, if applicable. No additional management support is needed unless otherwise documented below in the visit note. 

## 2014-05-15 LAB — HM MAMMOGRAPHY

## 2014-05-19 ENCOUNTER — Encounter: Payer: Self-pay | Admitting: General Practice

## 2014-08-19 ENCOUNTER — Other Ambulatory Visit: Payer: Self-pay | Admitting: Family Medicine

## 2014-08-19 NOTE — Telephone Encounter (Signed)
Med filled.  

## 2014-08-31 ENCOUNTER — Other Ambulatory Visit: Payer: Self-pay | Admitting: Family Medicine

## 2014-08-31 NOTE — Telephone Encounter (Signed)
Med denied, pt only needed for 12 weeks.

## 2014-10-15 ENCOUNTER — Other Ambulatory Visit: Payer: Self-pay | Admitting: Family Medicine

## 2014-10-15 NOTE — Telephone Encounter (Signed)
Med filled.  

## 2014-11-02 ENCOUNTER — Encounter: Payer: Self-pay | Admitting: Family Medicine

## 2014-11-02 ENCOUNTER — Encounter: Payer: Self-pay | Admitting: General Practice

## 2014-11-02 ENCOUNTER — Ambulatory Visit (INDEPENDENT_AMBULATORY_CARE_PROVIDER_SITE_OTHER): Payer: BC Managed Care – PPO | Admitting: Family Medicine

## 2014-11-02 VITALS — BP 120/80 | HR 72 | Temp 98.0°F | Resp 16 | Wt 273.4 lb

## 2014-11-02 DIAGNOSIS — E785 Hyperlipidemia, unspecified: Secondary | ICD-10-CM

## 2014-11-02 DIAGNOSIS — M5410 Radiculopathy, site unspecified: Secondary | ICD-10-CM

## 2014-11-02 DIAGNOSIS — I10 Essential (primary) hypertension: Secondary | ICD-10-CM | POA: Diagnosis not present

## 2014-11-02 LAB — LIPID PANEL
Cholesterol: 127 mg/dL (ref 0–200)
HDL: 32.1 mg/dL — AB (ref >39.00)
LDL CALC: 84 mg/dL (ref 0–99)
NonHDL: 94.9
TRIGLYCERIDES: 53 mg/dL (ref 0.0–149.0)
Total CHOL/HDL Ratio: 4
VLDL: 10.6 mg/dL (ref 0.0–40.0)

## 2014-11-02 LAB — CBC WITH DIFFERENTIAL/PLATELET
Basophils Absolute: 0 10*3/uL (ref 0.0–0.1)
Basophils Relative: 0.3 % (ref 0.0–3.0)
EOS ABS: 0.1 10*3/uL (ref 0.0–0.7)
EOS PCT: 1.2 % (ref 0.0–5.0)
HCT: 36.6 % (ref 36.0–46.0)
Hemoglobin: 12.5 g/dL (ref 12.0–15.0)
LYMPHS PCT: 27.7 % (ref 12.0–46.0)
Lymphs Abs: 1.7 10*3/uL (ref 0.7–4.0)
MCHC: 34.2 g/dL (ref 30.0–36.0)
MCV: 87.2 fl (ref 78.0–100.0)
Monocytes Absolute: 0.6 10*3/uL (ref 0.1–1.0)
Monocytes Relative: 9.5 % (ref 3.0–12.0)
NEUTROS PCT: 61.3 % (ref 43.0–77.0)
Neutro Abs: 3.8 10*3/uL (ref 1.4–7.7)
Platelets: 212 10*3/uL (ref 150.0–400.0)
RBC: 4.2 Mil/uL (ref 3.87–5.11)
RDW: 12.6 % (ref 11.5–15.5)
WBC: 6.2 10*3/uL (ref 4.0–10.5)

## 2014-11-02 LAB — HEPATIC FUNCTION PANEL
ALBUMIN: 3.6 g/dL (ref 3.5–5.2)
ALK PHOS: 54 U/L (ref 39–117)
ALT: 10 U/L (ref 0–35)
AST: 13 U/L (ref 0–37)
Bilirubin, Direct: 0.2 mg/dL (ref 0.0–0.3)
TOTAL PROTEIN: 7 g/dL (ref 6.0–8.3)
Total Bilirubin: 0.5 mg/dL (ref 0.2–1.2)

## 2014-11-02 LAB — BASIC METABOLIC PANEL
BUN: 10 mg/dL (ref 6–23)
CALCIUM: 8.9 mg/dL (ref 8.4–10.5)
CO2: 30 meq/L (ref 19–32)
Chloride: 103 mEq/L (ref 96–112)
Creatinine, Ser: 0.76 mg/dL (ref 0.40–1.20)
GFR: 105.96 mL/min (ref >60.00)
GLUCOSE: 90 mg/dL (ref 70–99)
Potassium: 3.6 mEq/L (ref 3.5–5.1)
Sodium: 137 mEq/L (ref 135–145)

## 2014-11-02 MED ORDER — CARVEDILOL 6.25 MG PO TABS: ORAL_TABLET | ORAL | Status: DC

## 2014-11-02 MED ORDER — FLUTICASONE PROPIONATE 50 MCG/ACT NA SUSP: 1.0000 | Freq: Two times a day (BID) | NASAL | Status: DC

## 2014-11-02 MED ORDER — IBUPROFEN 600 MG PO TABS: 600.0000 mg | ORAL_TABLET | Freq: Three times a day (TID) | ORAL | Status: DC | PRN

## 2014-11-02 MED ORDER — CYCLOBENZAPRINE HCL 10 MG PO TABS: 10.0000 mg | ORAL_TABLET | Freq: Three times a day (TID) | ORAL | Status: DC | PRN

## 2014-11-02 MED ORDER — TRIAMTERENE-HCTZ 37.5-25 MG PO TABS: ORAL_TABLET | ORAL | Status: DC

## 2014-11-02 NOTE — Progress Notes (Signed)
Pre visit review using our clinic review tool, if applicable. No additional management support is needed unless otherwise documented below in the visit note. 

## 2014-11-02 NOTE — Assessment & Plan Note (Signed)
Chronic problem.  Tolerating statin w/o difficulty.  Check labs.  Adjust meds prn  

## 2014-11-02 NOTE — Assessment & Plan Note (Addendum)
New.  No red flags on hx or PE.  Start scheduled NSAIDs for inflammation.  Flexeril nightly for spasm.  Reviewed supportive care and red flags that should prompt return.  Pt expressed understanding and is in agreement w/ plan.

## 2014-11-02 NOTE — Progress Notes (Signed)
   Subjective:    Patient ID: Marcia Chan, female    DOB: 07/03/1970, 44 y.o.   MRN: [id number]  HPI HTN- chronic problem, on Coreg, Maxzide.  Excellent control.  'making some gradual lifestyle changes'.  Husband is now diabetic and pt has cut way back on carbs.  Started an exercise program.  Denies CP, SOB, HAs, visual changes, edema.  Hyperlipidemia- chronic problem, Pravastatin.  Denies abd pain, N/V.  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  Pain started under L shoulder blade but has moved downward into buttock and now 'shoots down' L leg.  No weakness or numbness noted.   Review of Systems For ROS see HPI     Objective:   Physical Exam  Constitutional: She is oriented to person, place, and time. She appears well-developed and well-nourished. No distress.  HENT:  Head: Normocephalic and atraumatic.  Eyes: Conjunctivae and EOM are normal. Pupils are equal, round, and reactive to light.  Neck: Normal range of motion. Neck supple. No thyromegaly present.  Cardiovascular: Normal rate, regular rhythm, normal heart sounds and intact distal pulses.   No murmur heard. Pulmonary/Chest: Effort normal and breath sounds normal. No respiratory distress.  Abdominal: Soft. She exhibits no distension. There is no tenderness.  Musculoskeletal: She exhibits tenderness (mild TTP over L sciatic notch). She exhibits no edema.  Lymphadenopathy:    She has no cervical adenopathy.  Neurological: She is alert and oriented to person, place, and time. She has normal reflexes. No cranial nerve deficit. Coordination normal.  (+) SLR on L  Skin: Skin is warm and dry.  Psychiatric: She has a normal mood and affect. Her behavior is normal.  Vitals reviewed.         Assessment & Plan:

## 2014-11-02 NOTE — Patient Instructions (Signed)
Schedule your complete physical in 6 months We'll notify you of your lab results and make any changes if needed Start the Ibuprofen 3x/day w/ food- take for 5-7 days Use the Flexeril at night for muscle spasm- will cause drowsiness HEAT! Keep up the good work on your lifestyle changes- you're doing great! Call with any questions or concerns Happy Spring!!!

## 2014-11-02 NOTE — Assessment & Plan Note (Signed)
Chronic problem, well controlled.  Asymptomatic.  Applauded pt's extensive lifestyle changes.  Check labs.  No anticipated med changes.

## 2015-03-26 ENCOUNTER — Ambulatory Visit (INDEPENDENT_AMBULATORY_CARE_PROVIDER_SITE_OTHER): Payer: BC Managed Care – PPO | Admitting: Family Medicine

## 2015-03-26 ENCOUNTER — Encounter: Payer: Self-pay | Admitting: Family Medicine

## 2015-03-26 VITALS — BP 126/86 | HR 87 | Temp 98.5°F | Resp 17 | Wt 276.1 lb

## 2015-03-26 DIAGNOSIS — J40 Bronchitis, not specified as acute or chronic: Secondary | ICD-10-CM

## 2015-03-26 DIAGNOSIS — J209 Acute bronchitis, unspecified: Secondary | ICD-10-CM | POA: Insufficient documentation

## 2015-03-26 MED ORDER — ALBUTEROL SULFATE (2.5 MG/3ML) 0.083% IN NEBU
2.5000 mg | INHALATION_SOLUTION | Freq: Once | RESPIRATORY_TRACT | Status: AC
  Administered 2015-03-26: 2.5 mg via RESPIRATORY_TRACT

## 2015-03-26 MED ORDER — PROMETHAZINE-DM 6.25-15 MG/5ML PO SYRP: 5.0000 mL | ORAL_SOLUTION | Freq: Four times a day (QID) | ORAL | Status: DC | PRN

## 2015-03-26 MED ORDER — AZITHROMYCIN 250 MG PO TABS: ORAL_TABLET | ORAL | Status: DC

## 2015-03-26 NOTE — Progress Notes (Signed)
   Subjective:    Patient ID: Marcia Chan, female    DOB: [birth year], 44 y.o.   MRN: [id number]  HPI URI- woke up hoarse earlier this week but was not feeling bad until Tuesday when she developed PND.  Wednesday developed cough and 'it's getting worse and worse and worse'.  Painful to cough.  Not productive.  Took Mucinex yesterday and got 'little chunks of yellow'.  No fevers.  + HA and frontal pain.  + sick contacts.  No N/V.  No ear pain.   Review of Systems For ROS see HPI     Objective:   Physical Exam  Constitutional: She appears well-developed and well-nourished. No distress.  HENT:  Head: Normocephalic and atraumatic.  TMs normal bilaterally Marked nasal congestion Throat w/out erythema, edema, or exudate but copious PND  Eyes: Conjunctivae and EOM are normal. Pupils are equal, round, and reactive to light.  Neck: Normal range of motion. Neck supple.  Cardiovascular: Normal rate, regular rhythm, normal heart sounds and intact distal pulses.   No murmur heard. Pulmonary/Chest: Effort normal. No respiratory distress. She has no wheezes.  + hacking cough Abnormal BS in RUL that cleared w/ neb tx  Lymphadenopathy:    She has no cervical adenopathy.  Vitals reviewed.         Assessment & Plan:

## 2015-03-26 NOTE — Patient Instructions (Signed)
Follow up as needed Start the Zpack for the bronchitis Drink plenty of fluids Continue Mucinex DM for daytime cough Use the cough syrup for nights and weekends- will cause drowsiness REST! Call with any questions or concerns Hang in there!

## 2015-03-26 NOTE — Assessment & Plan Note (Signed)
New.  Pt's sxs and PE consistent w/ acute airway inflammation and possible infxn.  Initially had abnormal breath sounds in RUL but this cleared after neb tx.  Will start Zpack and promethazine cough syrup.  Reviewed supportive care and red flags that should prompt return.  Pt expressed understanding and is in agreement w/ plan.

## 2015-03-26 NOTE — Progress Notes (Signed)
Pre visit review using our clinic review tool, if applicable. No additional management support is needed unless otherwise documented below in the visit note. 

## 2015-04-08 ENCOUNTER — Other Ambulatory Visit: Payer: Self-pay | Admitting: Family Medicine

## 2015-04-08 NOTE — Telephone Encounter (Signed)
Medication filled to pharmacy as requested.   

## 2015-05-06 ENCOUNTER — Telehealth: Payer: Self-pay | Admitting: Behavioral Health

## 2015-05-06 NOTE — Telephone Encounter (Signed)
Attempted to reach patient at time of Pre-Visit Call. Unable to leave a message;  per the recording, mailbox is full and can't accept any messages.

## 2015-05-07 ENCOUNTER — Encounter: Payer: BC Managed Care – PPO | Admitting: Family Medicine

## 2015-05-07 ENCOUNTER — Ambulatory Visit (INDEPENDENT_AMBULATORY_CARE_PROVIDER_SITE_OTHER): Payer: BC Managed Care – PPO | Admitting: Behavioral Health

## 2015-05-07 DIAGNOSIS — Z23 Encounter for immunization: Secondary | ICD-10-CM | POA: Diagnosis not present

## 2015-05-07 NOTE — Progress Notes (Signed)
Pre visit review using our clinic review tool, if applicable. No additional management support is needed unless otherwise documented below in the visit note. 

## 2015-05-14 ENCOUNTER — Other Ambulatory Visit: Payer: Self-pay | Admitting: Family Medicine

## 2015-05-14 NOTE — Telephone Encounter (Signed)
Medication filled to pharmacy as requested.   

## 2015-06-02 ENCOUNTER — Telehealth: Payer: Self-pay | Admitting: Behavioral Health

## 2015-06-02 NOTE — Telephone Encounter (Signed)
Unable to reach patient at time of Pre-Visit Call. Per recording, the voice mailbox is full and cannot accept any messages at this time.

## 2015-06-03 ENCOUNTER — Encounter: Payer: Self-pay | Admitting: Family Medicine

## 2015-06-03 ENCOUNTER — Ambulatory Visit (INDEPENDENT_AMBULATORY_CARE_PROVIDER_SITE_OTHER): Payer: BC Managed Care – PPO | Admitting: Family Medicine

## 2015-06-03 VITALS — BP 134/88 | HR 70 | Temp 98.6°F | Resp 16 | Ht 68.0 in | Wt 278.5 lb

## 2015-06-03 DIAGNOSIS — Z0000 Encounter for general adult medical examination without abnormal findings: Secondary | ICD-10-CM

## 2015-06-03 LAB — VITAMIN D 25 HYDROXY (VIT D DEFICIENCY, FRACTURES): VITD: 20.63 ng/mL — AB (ref 30.00–100.00)

## 2015-06-03 LAB — HEPATIC FUNCTION PANEL
ALBUMIN: 3.8 g/dL (ref 3.5–5.2)
ALK PHOS: 57 U/L (ref 39–117)
ALT: 16 U/L (ref 0–35)
AST: 15 U/L (ref 0–37)
BILIRUBIN DIRECT: 0.2 mg/dL (ref 0.0–0.3)
Total Bilirubin: 0.7 mg/dL (ref 0.2–1.2)
Total Protein: 7.4 g/dL (ref 6.0–8.3)

## 2015-06-03 LAB — LIPID PANEL
CHOL/HDL RATIO: 4
CHOLESTEROL: 154 mg/dL (ref 0–200)
HDL: 35.4 mg/dL — ABNORMAL LOW (ref >39.00)
LDL Cholesterol: 109 mg/dL — ABNORMAL HIGH (ref 0–99)
NonHDL: 119.09
TRIGLYCERIDES: 50 mg/dL (ref 0.0–149.0)
VLDL: 10 mg/dL (ref 0.0–40.0)

## 2015-06-03 LAB — CBC WITH DIFFERENTIAL/PLATELET
BASOS PCT: 0.4 % (ref 0.0–3.0)
Basophils Absolute: 0 10*3/uL (ref 0.0–0.1)
EOS PCT: 1.6 % (ref 0.0–5.0)
Eosinophils Absolute: 0.1 10*3/uL (ref 0.0–0.7)
HCT: 39.7 % (ref 36.0–46.0)
Hemoglobin: 13.1 g/dL (ref 12.0–15.0)
LYMPHS ABS: 1.8 10*3/uL (ref 0.7–4.0)
Lymphocytes Relative: 30.3 % (ref 12.0–46.0)
MCHC: 33 g/dL (ref 30.0–36.0)
MCV: 90.6 fl (ref 78.0–100.0)
MONO ABS: 0.6 10*3/uL (ref 0.1–1.0)
Monocytes Relative: 10.1 % (ref 3.0–12.0)
NEUTROS ABS: 3.4 10*3/uL (ref 1.4–7.7)
NEUTROS PCT: 57.6 % (ref 43.0–77.0)
PLATELETS: 229 10*3/uL (ref 150.0–400.0)
RBC: 4.38 Mil/uL (ref 3.87–5.11)
RDW: 13.2 % (ref 11.5–15.5)
WBC: 5.9 10*3/uL (ref 4.0–10.5)

## 2015-06-03 LAB — BASIC METABOLIC PANEL
BUN: 12 mg/dL (ref 6–23)
CHLORIDE: 104 meq/L (ref 96–112)
CO2: 31 mEq/L (ref 19–32)
Calcium: 9.4 mg/dL (ref 8.4–10.5)
Creatinine, Ser: 0.71 mg/dL (ref 0.40–1.20)
GFR: 114.32 mL/min (ref >60.00)
Glucose, Bld: 87 mg/dL (ref 70–99)
POTASSIUM: 4.4 meq/L (ref 3.5–5.1)
Sodium: 139 mEq/L (ref 135–145)

## 2015-06-03 LAB — TSH: TSH: 1.36 u[IU]/mL (ref 0.35–4.50)

## 2015-06-03 NOTE — Progress Notes (Signed)
Pre visit review using our clinic review tool, if applicable. No additional management support is needed unless otherwise documented below in the visit note. 

## 2015-06-03 NOTE — Progress Notes (Signed)
   Subjective:    Patient ID: Marcia Chan, female    DOB: 06/13/1971, 44 y.o.   MRN: [id number]  HPI CPE- UTD on GYN, mammo is scheduled.   Review of Systems Patient reports no vision/ hearing changes, adenopathy,fever, weight change,  persistant/recurrent hoarseness , swallowing issues, chest pain, palpitations, edema, persistant/recurrent cough, hemoptysis, dyspnea (rest/exertional/paroxysmal nocturnal), gastrointestinal bleeding (melena, rectal bleeding), abdominal pain, significant heartburn, bowel changes, GU symptoms (dysuria, hematuria, incontinence), Gyn symptoms (abnormal  bleeding, pain),  syncope, focal weakness, memory loss, numbness & tingling, skin/hair/nail changes, abnormal bruising or bleeding, anxiety, or depression.     Objective:   Physical Exam General Appearance:    Alert, cooperative, no distress, appears stated age  Head:    Normocephalic, without obvious abnormality, atraumatic  Eyes:    PERRL, conjunctiva/corneas clear, EOM's intact, fundi    benign, both eyes  Ears:    Normal TM's and external ear canals, both ears  Nose:   Nares normal, septum midline, mucosa normal, no drainage    or sinus tenderness  Throat:   Lips, mucosa, and tongue normal; teeth and gums normal  Neck:   Supple, symmetrical, trachea midline, no adenopathy;    Thyroid: no enlargement/tenderness/nodules  Back:     Symmetric, no curvature, ROM normal, no CVA tenderness  Lungs:     Clear to auscultation bilaterally, respirations unlabored  Chest Wall:    No tenderness or deformity   Heart:    Regular rate and rhythm, S1 and S2 normal, no murmur, rub   or gallop  Breast Exam:    Deferred to GYN  Abdomen:     Soft, non-tender, bowel sounds active all four quadrants,    no masses, no organomegaly  Genitalia:    Deferred to GYN  Rectal:    Extremities:   Extremities normal, atraumatic, no cyanosis or edema  Pulses:   2+ and symmetric all extremities  Skin:   Skin color, texture, turgor  normal, no rashes or lesions  Lymph nodes:   Cervical, supraclavicular, and axillary nodes normal  Neurologic:   CNII-XII intact, normal strength, sensation and reflexes    throughout          Assessment & Plan:

## 2015-06-03 NOTE — Assessment & Plan Note (Signed)
Pt's PE WNL w/ exception of obesity.  UTD on GYN.  mammo scheduled.  Check labs.  Anticipatory guidance provided.

## 2015-06-03 NOTE — Patient Instructions (Signed)
Follow up in 6 months to recheck BP and cholesterol We'll notify you of your lab results and make any changes if needed Keep up the good work on healthy diet and regular exercise- you look great!!! Call with any questions or concerns If you want to join us at the new [place] office, any scheduled appointments will automatically transfer and we will see you at [address], [place], [place] [address] (OPENING 07/06/15) Happy Holidays!!! 

## 2015-06-04 ENCOUNTER — Other Ambulatory Visit: Payer: Self-pay | Admitting: General Practice

## 2015-06-04 MED ORDER — VITAMIN D (ERGOCALCIFEROL) 1.25 MG (50000 UNIT) PO CAPS: 50000.0000 [IU] | ORAL_CAPSULE | ORAL | Status: DC

## 2015-06-07 LAB — HM MAMMOGRAPHY

## 2015-06-10 ENCOUNTER — Encounter: Payer: Self-pay | Admitting: General Practice

## 2015-07-14 ENCOUNTER — Other Ambulatory Visit: Payer: Self-pay | Admitting: Family Medicine

## 2015-07-14 NOTE — Telephone Encounter (Signed)
Rx's denied-Rx's have already been filled.//AB/CMA

## 2015-08-09 ENCOUNTER — Other Ambulatory Visit: Payer: Self-pay | Admitting: Family Medicine

## 2015-08-09 NOTE — Telephone Encounter (Signed)
Medication filled to pharmacy as requested.   

## 2015-08-10 ENCOUNTER — Other Ambulatory Visit: Payer: Self-pay | Admitting: Family Medicine

## 2015-08-10 NOTE — Telephone Encounter (Signed)
Medication filled to pharmacy as requested.   

## 2015-08-26 ENCOUNTER — Other Ambulatory Visit: Payer: Self-pay | Admitting: Family Medicine

## 2015-08-26 NOTE — Telephone Encounter (Signed)
Pt needs to continue OTC Vitamin D 2000iu daily.

## 2015-10-06 ENCOUNTER — Other Ambulatory Visit: Payer: Self-pay | Admitting: Family Medicine

## 2015-10-06 NOTE — Telephone Encounter (Signed)
Medication filled to pharmacy as requested.   

## 2015-12-16 ENCOUNTER — Encounter: Payer: Self-pay | Admitting: Medical

## 2015-12-16 ENCOUNTER — Ambulatory Visit (INDEPENDENT_AMBULATORY_CARE_PROVIDER_SITE_OTHER): Payer: BC Managed Care – PPO | Admitting: Medical

## 2015-12-16 VITALS — BP 128/88 | HR 80 | Temp 98.1°F | Resp 16 | Ht 68.0 in | Wt 281.4 lb

## 2015-12-16 DIAGNOSIS — R05 Cough: Secondary | ICD-10-CM

## 2015-12-16 DIAGNOSIS — J0100 Acute maxillary sinusitis, unspecified: Secondary | ICD-10-CM | POA: Diagnosis not present

## 2015-12-16 DIAGNOSIS — J309 Allergic rhinitis, unspecified: Secondary | ICD-10-CM

## 2015-12-16 DIAGNOSIS — R059 Cough, unspecified: Secondary | ICD-10-CM

## 2015-12-16 MED ORDER — FLUCONAZOLE 150 MG PO TABS: 150.0000 mg | ORAL_TABLET | Freq: Once | ORAL | Status: DC

## 2015-12-16 MED ORDER — BENZONATATE 100 MG PO CAPS: 100.0000 mg | ORAL_CAPSULE | Freq: Three times a day (TID) | ORAL | Status: DC | PRN

## 2015-12-16 MED ORDER — METHYLPREDNISOLONE ACETATE 40 MG/ML IJ SUSP
40.0000 mg | Freq: Once | INTRAMUSCULAR | Status: AC
  Administered 2015-12-16: 40 mg via INTRAMUSCULAR

## 2015-12-16 MED ORDER — FLUTICASONE PROPIONATE 50 MCG/ACT NA SUSP: 2.0000 | Freq: Every day | NASAL | Status: DC

## 2015-12-16 MED ORDER — AMOXICILLIN-POT CLAVULANATE 875-125 MG PO TABS: 1.0000 | ORAL_TABLET | Freq: Two times a day (BID) | ORAL | Status: DC

## 2015-12-16 NOTE — Patient Instructions (Signed)
For allergies(and upcoming vacation) will give depomedrol 40 mg im. Also rx flonase.  For cough rx benzonatate.  For sinus infection augmentin.  In event yeast infection while on vacation rx diflucan.  Follow up in 7-10 days or as needed

## 2015-12-16 NOTE — Progress Notes (Signed)
Pre visit review using our clinic review tool, if applicable. No additional management support is needed unless otherwise documented below in the visit note. 

## 2015-12-16 NOTE — Progress Notes (Signed)
Subjective:    Patient ID: Marcia Chan, female    DOB: June 24, 1971, 45 y.o.   MRN: [id number]  HPI   Pt states she has 2.5 week of nasal congestion. She thought at first allergies. Runny nose at first. A lot of pnd. Pt has occasional allergies in past but very limited duration. Maxillary sinus pressure. Mild cough that started few days ago. When she spits get up little yellow mucous.  LMP- on mirena.  Review of Systems  Constitutional: Negative for fever, chills and fatigue.  HENT: Positive for congestion, postnasal drip, rhinorrhea and sinus pressure. Negative for ear pain.   Respiratory: Positive for cough. Negative for shortness of breath and wheezing.   Cardiovascular: Negative for chest pain and palpitations.  Musculoskeletal: Negative for myalgias and arthralgias.  Neurological: Negative for dizziness and light-headedness.  Hematological: Negative for adenopathy. Does not bruise/bleed easily.  Psychiatric/Behavioral: Negative for behavioral problems and confusion.    Past Medical History  Diagnosis Date  . Hyperlipidemia   . Hypertension   . GERD (gastroesophageal reflux disease)   . Vertigo      Social History   Social History  . Marital Status: Married    Spouse Name: N/A  . Number of Children: N/A  . Years of Education: N/A   Occupational History  . Not on file.   Social History Main Topics  . Smoking status: Never Smoker   . Smokeless tobacco: Not on file  . Alcohol Use: No  . Drug Use: No  . Sexual Activity: Not on file   Other Topics Concern  . Not on file   Social History Narrative   G3 P1 A2    No past surgical history on file.  Family History  Problem Relation Age of Onset  . Arthritis Mother     OA/DJD  . Heart disease Mother   . Diabetes Mother   . Hypertension Mother     Allergies  Allergen Reactions  . Bextra [Valdecoxib]     Rash     Current Outpatient Prescriptions on File Prior to Visit  Medication Sig Dispense  Refill  . carvedilol (COREG) 6.25 MG tablet TAKE 1 TABLET BY MOUTH TWICE DAILY WITH A MEAL 180 tablet 1  . pravastatin (PRAVACHOL) 40 MG tablet TAKE 1 TABLET BY MOUTH EVERY DAY 90 tablet 0  . triamterene-hydrochlorothiazide (MAXZIDE-25) 37.5-25 MG tablet TAKE 1 TABLET BY MOUTH DAILY 90 tablet 1  . Vitamin D, Ergocalciferol, (DRISDOL) 50000 UNITS CAPS capsule Take 1 capsule (50,000 Units total) by mouth every 7 (seven) days. 12 capsule 0   No current facility-administered medications on file prior to visit.    BP 128/88 mmHg  Pulse 80  Temp(Src) 98.1 F (36.7 C) (Oral)  Resp 16  Ht  (1.727 m)  Wt 281 lb 6.4 oz (127.642 kg)  BMI 42.80 kg/m2  SpO2 98%       Objective:   Physical Exam  General  Mental Status - Alert. General Appearance - Well groomed. Not in acute distress.  Skin Rashes- No Rashes.  HEENT Head- Normal. Ear Auditory Canal - Left- Normal. Right - Normal.Tympanic Membrane- Left- Normal. Right- Normal. Eye Sclera/Conjunctiva- Left- Normal. Right- Normal. Nose & Sinuses Nasal Mucosa- Left-  Boggy and Congested. Right-  Boggy and  Congested.Bilateral maxillary sinus pressure but no  frontal sinus pressure. Mouth & Throat Lips: Upper Lip- Normal: no dryness, cracking, pallor, cyanosis, or vesicular eruption. Lower Lip-Normal: no dryness, cracking, pallor, cyanosis or vesicular eruption. Buccal  Mucosa- Bilateral- No Aphthous ulcers. Oropharynx- No Discharge or Erythema. +pnd. Tonsils: Characteristics- Bilateral- No Erythema or Congestion. Size/Enlargement- Bilateral- No enlargement. Discharge- bilateral-None.  Neck Neck- Supple. No Masses.   Chest and Lung Exam Auscultation: Breath Sounds:-Clear even and unlabored.  Cardiovascular Auscultation:Rythm- Regular, rate and rhythm. Murmurs & Other Heart Sounds:Ausculatation of the heart reveal- No Murmurs.  Lymphatic Head & Neck General Head & Neck Lymphatics: Bilateral: Description- No Localized  lymphadenopathy.       Assessment & Plan:  For allergies(and upcoming vacation) will give depomedrol 40 mg im. Also rx flonase.  For cough rx benzonatate.  For sinus infection augmentin.  In event yeast infection while on vacation rx diflucan.  Follow up in 7-10 days or as needed  Leslieanne Cobarrubias, Ramon Dredgedward, PA-C   Benefits vs risk of depomedrol discussed. Pt expressed desire to be better while on vacation. She agreed to the injection.

## 2015-12-16 NOTE — Addendum Note (Signed)
Addended by: Neldon LabellaMABE, HOLDEN S on: 12/16/2015 10:51 AM   Modules accepted: Orders

## 2015-12-20 ENCOUNTER — Ambulatory Visit: Payer: BC Managed Care – PPO | Admitting: Family Medicine

## 2015-12-22 ENCOUNTER — Encounter: Payer: Self-pay | Admitting: Family Medicine

## 2015-12-22 ENCOUNTER — Ambulatory Visit (INDEPENDENT_AMBULATORY_CARE_PROVIDER_SITE_OTHER): Payer: BC Managed Care – PPO | Admitting: Family Medicine

## 2015-12-22 VITALS — BP 124/84 | HR 88 | Temp 97.9°F | Resp 16 | Ht 68.0 in | Wt 281.0 lb

## 2015-12-22 DIAGNOSIS — E785 Hyperlipidemia, unspecified: Secondary | ICD-10-CM

## 2015-12-22 DIAGNOSIS — I10 Essential (primary) hypertension: Secondary | ICD-10-CM | POA: Diagnosis not present

## 2015-12-22 LAB — CBC WITH DIFFERENTIAL/PLATELET
BASOS PCT: 0.4 % (ref 0.0–3.0)
Basophils Absolute: 0 10*3/uL (ref 0.0–0.1)
EOS ABS: 0.1 10*3/uL (ref 0.0–0.7)
Eosinophils Relative: 1.1 % (ref 0.0–5.0)
HCT: 40.5 % (ref 36.0–46.0)
HEMOGLOBIN: 13.5 g/dL (ref 12.0–15.0)
Lymphocytes Relative: 23.3 % (ref 12.0–46.0)
Lymphs Abs: 1.8 10*3/uL (ref 0.7–4.0)
MCHC: 33.4 g/dL (ref 30.0–36.0)
MCV: 89.2 fl (ref 78.0–100.0)
MONO ABS: 0.7 10*3/uL (ref 0.1–1.0)
Monocytes Relative: 9 % (ref 3.0–12.0)
Neutro Abs: 5.2 10*3/uL (ref 1.4–7.7)
Neutrophils Relative %: 66.2 % (ref 43.0–77.0)
PLATELETS: 248 10*3/uL (ref 150.0–400.0)
RBC: 4.54 Mil/uL (ref 3.87–5.11)
RDW: 12.9 % (ref 11.5–15.5)
WBC: 7.9 10*3/uL (ref 4.0–10.5)

## 2015-12-22 LAB — BASIC METABOLIC PANEL
BUN: 10 mg/dL (ref 6–23)
CHLORIDE: 101 meq/L (ref 96–112)
CO2: 30 mEq/L (ref 19–32)
Calcium: 9.6 mg/dL (ref 8.4–10.5)
Creatinine, Ser: 0.69 mg/dL (ref 0.40–1.20)
GFR: 117.86 mL/min (ref >60.00)
GLUCOSE: 88 mg/dL (ref 70–99)
POTASSIUM: 4.1 meq/L (ref 3.5–5.1)
Sodium: 136 mEq/L (ref 135–145)

## 2015-12-22 LAB — HEPATIC FUNCTION PANEL
ALBUMIN: 4 g/dL (ref 3.5–5.2)
ALT: 17 U/L (ref 0–35)
AST: 16 U/L (ref 0–37)
Alkaline Phosphatase: 55 U/L (ref 39–117)
BILIRUBIN TOTAL: 0.5 mg/dL (ref 0.2–1.2)
Bilirubin, Direct: 0.1 mg/dL (ref 0.0–0.3)
Total Protein: 7.5 g/dL (ref 6.0–8.3)

## 2015-12-22 LAB — LIPID PANEL
CHOLESTEROL: 157 mg/dL (ref 0–200)
HDL: 42.8 mg/dL (ref >39.00)
LDL Cholesterol: 104 mg/dL — ABNORMAL HIGH (ref 0–99)
NonHDL: 113.94
TRIGLYCERIDES: 52 mg/dL (ref 0.0–149.0)
Total CHOL/HDL Ratio: 4
VLDL: 10.4 mg/dL (ref 0.0–40.0)

## 2015-12-22 MED ORDER — TRIAMTERENE-HCTZ 37.5-25 MG PO TABS: 1.0000 | ORAL_TABLET | Freq: Every day | ORAL | Status: DC

## 2015-12-22 MED ORDER — CARVEDILOL 6.25 MG PO TABS: ORAL_TABLET | ORAL | Status: DC

## 2015-12-22 MED ORDER — PRAVASTATIN SODIUM 40 MG PO TABS: 40.0000 mg | ORAL_TABLET | Freq: Every day | ORAL | Status: DC

## 2015-12-22 NOTE — Addendum Note (Signed)
Addended by: Geannie RisenBRODMERKEL, JESSICA L on: 12/22/2015 10:24 AM   Modules accepted: Orders

## 2015-12-22 NOTE — Assessment & Plan Note (Signed)
Chronic problem.  Tolerating statin w/o difficulty.  Stressed need for healthy diet and regular exercise.  Check labs.  Adjust meds prn  

## 2015-12-22 NOTE — Assessment & Plan Note (Signed)
Chronic problem, well controlled.  Asymptomatic.  Check labs.  No anticipated med changes. °

## 2015-12-22 NOTE — Patient Instructions (Signed)
Schedule your complete physical in 6 months We'll notify you of your lab results and make any changes if needed Continue to work on healthy diet and regular exercise- you can do it! Call with any questions or concerns Have a great summer!!! 

## 2015-12-22 NOTE — Progress Notes (Signed)
   Subjective:    Patient ID: Marcia Chan, female    DOB: 07/23/1970, 45 y.o.   MRN: [id number]  HPI HTN- chronic problem, on Triamterene HCTZ and Coreg daily.  No CP, SOB, HAs, visual changes, edema.  Hyperlipidemia- chronic problem, on Pravastatin daily.  No abd pain, N/V, myalgias.   Review of Systems For ROS see HPI     Objective:   Physical Exam  Constitutional: She is oriented to person, place, and time. She appears well-developed and well-nourished. No distress.  HENT:  Head: Normocephalic and atraumatic.  Eyes: Conjunctivae and EOM are normal. Pupils are equal, round, and reactive to light.  Neck: Normal range of motion. Neck supple. No thyromegaly present.  Cardiovascular: Normal rate, regular rhythm, normal heart sounds and intact distal pulses.   No murmur heard. Pulmonary/Chest: Effort normal and breath sounds normal. No respiratory distress.  Abdominal: Soft. She exhibits no distension. There is no tenderness.  Musculoskeletal: She exhibits no edema.  Lymphadenopathy:    She has no cervical adenopathy.  Neurological: She is alert and oriented to person, place, and time.  Skin: Skin is warm and dry.  Psychiatric: She has a normal mood and affect. Her behavior is normal.  Vitals reviewed.         Assessment & Plan:

## 2015-12-22 NOTE — Progress Notes (Signed)
Pre visit review using our clinic review tool, if applicable. No additional management support is needed unless otherwise documented below in the visit note. 

## 2015-12-22 NOTE — Assessment & Plan Note (Signed)
Ongoing issue for pt.  BMI 42.8.  Stressed need for healthy diet and regular exercise.  Check labs to risk stratify.  Will follow.

## 2015-12-23 ENCOUNTER — Encounter: Payer: Self-pay | Admitting: General Practice

## 2016-01-29 ENCOUNTER — Emergency Department (HOSPITAL_BASED_OUTPATIENT_CLINIC_OR_DEPARTMENT_OTHER)
Admission: EM | Admit: 2016-01-29 | Discharge: 2016-01-29 | Disposition: A | Discharge status: Home / Self Care | Payer: BC Managed Care – PPO | Attending: Emergency Medicine | Admitting: Emergency Medicine

## 2016-01-29 ENCOUNTER — Encounter (HOSPITAL_BASED_OUTPATIENT_CLINIC_OR_DEPARTMENT_OTHER): Payer: Self-pay | Admitting: *Deleted

## 2016-01-29 ENCOUNTER — Emergency Department (HOSPITAL_BASED_OUTPATIENT_CLINIC_OR_DEPARTMENT_OTHER): Payer: BC Managed Care – PPO

## 2016-01-29 DIAGNOSIS — W458XXA Other foreign body or object entering through skin, initial encounter: Secondary | ICD-10-CM | POA: Diagnosis not present

## 2016-01-29 DIAGNOSIS — E785 Hyperlipidemia, unspecified: Secondary | ICD-10-CM | POA: Diagnosis not present

## 2016-01-29 DIAGNOSIS — Y939 Activity, unspecified: Secondary | ICD-10-CM | POA: Diagnosis not present

## 2016-01-29 DIAGNOSIS — M79671 Pain in right foot: Secondary | ICD-10-CM | POA: Diagnosis present

## 2016-01-29 DIAGNOSIS — S90851A Superficial foreign body, right foot, initial encounter: Secondary | ICD-10-CM | POA: Insufficient documentation

## 2016-01-29 DIAGNOSIS — Y929 Unspecified place or not applicable: Secondary | ICD-10-CM | POA: Insufficient documentation

## 2016-01-29 DIAGNOSIS — Y999 Unspecified external cause status: Secondary | ICD-10-CM | POA: Diagnosis not present

## 2016-01-29 DIAGNOSIS — I10 Essential (primary) hypertension: Secondary | ICD-10-CM | POA: Diagnosis not present

## 2016-01-29 DIAGNOSIS — Z79899 Other long term (current) drug therapy: Secondary | ICD-10-CM | POA: Insufficient documentation

## 2016-01-29 DIAGNOSIS — T148XXA Other injury of unspecified body region, initial encounter: Secondary | ICD-10-CM

## 2016-01-29 MED ORDER — LIDOCAINE-EPINEPHRINE 2 %-1:100000 IJ SOLN
20.0000 mL | Freq: Once | INTRAMUSCULAR | Status: DC
  Filled 2016-01-29: qty 1

## 2016-01-29 NOTE — ED Provider Notes (Signed)
[facility] Provider Note   CSN: [id number] Arrival date & time: 01/29/16  1128  First Provider Contact:  None       History   Chief Complaint Chief Complaint  Patient presents with  . Foot Pain    HPI Marcia Chan is a 45 y.o. female.  HPI  45 year old female presents today with foot pain. Patient reports that on Thursday she was walking on a hardwood floors when she felt a sharp pain in the bottom of her heel, she reports she was able to feel a small bump on the heel that appeared to be a splinter. She was unable to remove it. She notes that she soaked her foot in warm water over the last several days, and no longer is able to feel the bump but still has pain where it went in. Patient were seen at urgent care reports they are equipped to handle injuries of this nature and would need to go to the emergency room. Patient denies any surrounding redness, inflammation, discharge, fever, any other concerning signs or symptoms. She reports she is able to ambulate but has pain in the heel.   Past Medical History:  Diagnosis Date  . GERD (gastroesophageal reflux disease)   . Hyperlipidemia   . Hypertension   . Vertigo     Patient Active Problem List   Diagnosis Date Noted  . Bronchitis with bronchospasm 03/26/2015  . Acute low back pain with radicular symptoms, duration less than 6 weeks 11/02/2014  . Plantar fasciitis of left foot 01/06/2014  . Numbness and tingling of left leg 06/09/2013  . Lumbar back pain 12/23/2012  . Lateral epicondylitis 12/23/2012  . Laryngitis 07/26/2012  . GERD (gastroesophageal reflux disease) 05/03/2012  . Seasonal allergic rhinitis 10/08/2011  . General medical examination 10/06/2011  . Cough 06/21/2011  . Hair loss 02/21/2011  . HTN (hypertension) 02/10/2011  . Hyperlipidemia 02/10/2011  . Severe obesity (BMI >= 40) (HCC) 02/10/2011  . Nail fungus 02/10/2011    History reviewed. No pertinent surgical history.  OB  History    No data available      Home Medications    Prior to Admission medications   Medication Sig Start Date End Date Taking? Authorizing Provider  carvedilol (COREG) 6.25 MG tablet TAKE 1 TABLET BY MOUTH TWICE DAILY WITH A MEAL 12/22/15   Sheliah Hatch, MD  fluticasone [facility]) 50 MCG/ACT nasal spray Place 2 sprays into both nostrils daily. 12/16/15   Ramon Dredge Saguier, PA-C  pravastatin (PRAVACHOL) 40 MG tablet Take 1 tablet (40 mg total) by mouth daily. 12/22/15   Sheliah Hatch, MD  triamterene-hydrochlorothiazide (MAXZIDE-25) 37.5-25 MG tablet Take 1 tablet by mouth daily. 12/22/15   Sheliah Hatch, MD    Family History Family History  Problem Relation Age of Onset  . Arthritis Mother     OA/DJD  . Heart disease Mother   . Diabetes Mother   . Hypertension Mother     Social History Social History  Substance Use Topics  . Smoking status: Never Smoker  . Smokeless tobacco: Never Used  . Alcohol use No     Allergies   Bextra [valdecoxib]   Review of Systems Review of Systems  All other systems reviewed and are negative.   Physical Exam Updated Vital Signs BP 129/78 (BP Location: Right Arm)   Pulse 74   Temp 99.3 F (37.4 C) (Oral)   Resp 20   Ht  (1.676 m)   Wt 129.7  kg   SpO2 100%   BMI 46.16 kg/m   Physical Exam  Constitutional: She is oriented to person, place, and time. She appears well-developed and well-nourished.  HENT:  Head: Normocephalic and atraumatic.  Eyes: Conjunctivae are normal. Pupils are equal, round, and reactive to light. Right eye exhibits no discharge. Left eye exhibits no discharge. No scleral icterus.  Neck: Normal range of motion. No JVD present. No tracheal deviation present.  Pulmonary/Chest: Effort normal. No stridor.  Musculoskeletal:  Small splinter under epidermis, no surrounding redness, discharge  Neurological: She is alert and oriented to person, place, and time. Coordination normal.  Psychiatric: She  has a normal mood and affect. Her behavior is normal. Judgment and thought content normal.  Nursing note and vitals reviewed.    ED Treatments / Results  Labs (all labs ordered are listed, but only abnormal results are displayed) Labs Reviewed - No data to display  EKG  EKG Interpretation None       Radiology No results found.  Procedures Procedures (including critical care time)  Medications Ordered in ED Medications - No data to display   Initial Impression / Assessment and Plan / ED Course  I have reviewed the triage vital signs and the nursing notes.  Pertinent labs & imaging results that were available during my care of the patient were reviewed by me and considered in my medical decision making (see chart for details).  Clinical Course    Foreign body removal- using Betadine area was cleansed, 11 blade was used to remove the epidermis, very small shard of glass was noted and removed, the area was copiously irrigated and soaked, area nontender to palpation post foreign body removal. No blood loss. Patient handled the procedure well   Final Clinical Impressions(s) / ED Diagnoses   Final diagnoses:  Splinter   Labs:  Imaging:  Consults:  Therapeutics:  Discharge Meds:   Assessment/Plan:  45 year old female presents today with small shard of glass in the bottom of her foot.  Glass removed, wound copiously irrigated, nontender to palpation no further pieces of glass noted. Patient will be discharged home with symptomatic care instruction and return precautions.    New Prescriptions Discharge Medication List as of 01/29/2016 12:37 PM       Eyvonne Mechanic, PA-C 01/29/16 1724    Rolan Bucco, MD 01/30/16 [phone]

## 2016-01-29 NOTE — ED Triage Notes (Signed)
Per pt report was sent here from urgent care to be evaulated with item in rt foot near the heel. Attempt to remove on thursday.

## 2016-02-07 ENCOUNTER — Other Ambulatory Visit: Payer: Self-pay | Admitting: Family Medicine

## 2016-04-04 ENCOUNTER — Ambulatory Visit: Payer: BC Managed Care – PPO

## 2016-04-17 ENCOUNTER — Ambulatory Visit (INDEPENDENT_AMBULATORY_CARE_PROVIDER_SITE_OTHER): Payer: BC Managed Care – PPO

## 2016-04-17 DIAGNOSIS — Z23 Encounter for immunization: Secondary | ICD-10-CM | POA: Diagnosis not present

## 2016-06-30 LAB — HM MAMMOGRAPHY

## 2016-07-05 ENCOUNTER — Encounter: Payer: Self-pay | Admitting: General Practice

## 2016-07-10 ENCOUNTER — Other Ambulatory Visit: Payer: Self-pay | Admitting: Family Medicine

## 2016-07-10 ENCOUNTER — Other Ambulatory Visit: Payer: Self-pay | Admitting: General Practice

## 2016-07-10 MED ORDER — PRAVASTATIN SODIUM 40 MG PO TABS: 40.0000 mg | ORAL_TABLET | Freq: Every day | ORAL | 0 refills | Status: DC

## 2016-07-17 ENCOUNTER — Ambulatory Visit (INDEPENDENT_AMBULATORY_CARE_PROVIDER_SITE_OTHER): Payer: BC Managed Care – PPO | Admitting: Family Medicine

## 2016-07-17 ENCOUNTER — Encounter: Payer: Self-pay | Admitting: Family Medicine

## 2016-07-17 VITALS — BP 126/88 | HR 72 | Temp 98.9°F | Resp 16 | Ht 66.0 in | Wt 287.0 lb

## 2016-07-17 DIAGNOSIS — E785 Hyperlipidemia, unspecified: Secondary | ICD-10-CM | POA: Diagnosis not present

## 2016-07-17 DIAGNOSIS — K219 Gastro-esophageal reflux disease without esophagitis: Secondary | ICD-10-CM

## 2016-07-17 DIAGNOSIS — I10 Essential (primary) hypertension: Secondary | ICD-10-CM

## 2016-07-17 LAB — HEPATIC FUNCTION PANEL
ALBUMIN: 3.9 g/dL (ref 3.5–5.2)
ALK PHOS: 52 U/L (ref 39–117)
ALT: 14 U/L (ref 0–35)
AST: 14 U/L (ref 0–37)
Bilirubin, Direct: 0.1 mg/dL (ref 0.0–0.3)
TOTAL PROTEIN: 7 g/dL (ref 6.0–8.3)
Total Bilirubin: 0.7 mg/dL (ref 0.2–1.2)

## 2016-07-17 LAB — CBC WITH DIFFERENTIAL/PLATELET
BASOS ABS: 0 10*3/uL (ref 0.0–0.1)
Basophils Relative: 0.4 % (ref 0.0–3.0)
EOS ABS: 0.1 10*3/uL (ref 0.0–0.7)
Eosinophils Relative: 1.1 % (ref 0.0–5.0)
HCT: 39.5 % (ref 36.0–46.0)
Hemoglobin: 13.4 g/dL (ref 12.0–15.0)
LYMPHS PCT: 28.8 % (ref 12.0–46.0)
Lymphs Abs: 2.3 10*3/uL (ref 0.7–4.0)
MCHC: 34 g/dL (ref 30.0–36.0)
MCV: 89.3 fl (ref 78.0–100.0)
Monocytes Absolute: 0.6 10*3/uL (ref 0.1–1.0)
Monocytes Relative: 8.2 % (ref 3.0–12.0)
NEUTROS ABS: 4.9 10*3/uL (ref 1.4–7.7)
NEUTROS PCT: 61.5 % (ref 43.0–77.0)
PLATELETS: 238 10*3/uL (ref 150.0–400.0)
RBC: 4.43 Mil/uL (ref 3.87–5.11)
RDW: 13.1 % (ref 11.5–15.5)
WBC: 7.9 10*3/uL (ref 4.0–10.5)

## 2016-07-17 LAB — BASIC METABOLIC PANEL
BUN: 10 mg/dL (ref 6–23)
CALCIUM: 9.5 mg/dL (ref 8.4–10.5)
CO2: 28 meq/L (ref 19–32)
CREATININE: 0.74 mg/dL (ref 0.40–1.20)
Chloride: 101 mEq/L (ref 96–112)
GFR: 108.45 mL/min (ref >60.00)
GLUCOSE: 80 mg/dL (ref 70–99)
Potassium: 4 mEq/L (ref 3.5–5.1)
SODIUM: 135 meq/L (ref 135–145)

## 2016-07-17 LAB — LIPID PANEL
CHOLESTEROL: 169 mg/dL (ref 0–200)
HDL: 37.7 mg/dL — AB (ref >39.00)
LDL CALC: 117 mg/dL — AB (ref 0–99)
NonHDL: 131.42
TRIGLYCERIDES: 72 mg/dL (ref 0.0–149.0)
Total CHOL/HDL Ratio: 4
VLDL: 14.4 mg/dL (ref 0.0–40.0)

## 2016-07-17 LAB — HEMOGLOBIN A1C: HEMOGLOBIN A1C: 5.6 % (ref 4.6–6.5)

## 2016-07-17 LAB — TSH: TSH: 1.34 u[IU]/mL (ref 0.35–4.50)

## 2016-07-17 MED ORDER — CLOTRIMAZOLE-BETAMETHASONE 1-0.05 % EX CREA: 1.0000 "application " | TOPICAL_CREAM | Freq: Two times a day (BID) | CUTANEOUS | 0 refills | Status: DC

## 2016-07-17 MED ORDER — PANTOPRAZOLE SODIUM 40 MG PO TBEC: 40.0000 mg | DELAYED_RELEASE_TABLET | Freq: Every day | ORAL | 3 refills | Status: DC

## 2016-07-17 NOTE — Progress Notes (Signed)
Pre visit review using our clinic review tool, if applicable. No additional management support is needed unless otherwise documented below in the visit note. 

## 2016-07-17 NOTE — Assessment & Plan Note (Signed)
Deteriorated.  Start PPI.  Reviewed dietary and lifestyle modifications to improve sxs.  Will follow.

## 2016-07-17 NOTE — Assessment & Plan Note (Signed)
Chronic problem.  Adequate control today.  Asymptomatic.  Check labs.  No anticipated med changes.  Will follow. 

## 2016-07-17 NOTE — Assessment & Plan Note (Signed)
Chronic problem, tolerating statin w/o difficulty.  Stressed need for healthy diet and regular exercise.  Check labs.  Adjust meds prn  

## 2016-07-17 NOTE — Progress Notes (Signed)
   Subjective:    Patient ID: Marcia Chan, female    DOB: 07/05/1970, 46 y.o.   MRN: [id number]  HPI HTN- chronic problem, on Coreg twice daily, Triamterene HCTZ daily w/ adequate control.  Denies CP, SOB, HAs, visual changes.  Pt is no longer exercising, having a difficult time w/ meal planning.  Hyperlipidemia- chronic problem, on Pravastatin.  Denies abd pain, N/V, myalgias.  Rash- L foot, itching.  She has tried OTC creams and spray w/o relief.  Lateral edge of foot.  Started 3 months ago.  Obesity- ongoing issue for pt.  She had lost weight previously but gained it all back.  Has never seen nutrition.  Not exercising.  Pt reports her weight is starting to take a toll.  GERD- pt reports sxs have worsened recently.  Increased sour brash.  Not currently taking meds w/ exception of PRN tums.  Previously on Nexium but didn't like it.  Review of Systems For ROS see HPI     Objective:   Physical Exam  Constitutional: She is oriented to person, place, and time. She appears well-developed and well-nourished. No distress.  obesity  HENT:  Head: Normocephalic and atraumatic.  Eyes: Conjunctivae and EOM are normal. Pupils are equal, round, and reactive to light.  Neck: Normal range of motion. Neck supple. No thyromegaly present.  Cardiovascular: Normal rate, regular rhythm, normal heart sounds and intact distal pulses.   No murmur heard. Pulmonary/Chest: Effort normal and breath sounds normal. No respiratory distress.  Abdominal: Soft. She exhibits no distension. There is no tenderness.  Musculoskeletal: She exhibits no edema.  Lymphadenopathy:    She has no cervical adenopathy.  Neurological: She is alert and oriented to person, place, and time.  Skin: Skin is warm and dry. Rash (fungal dermatitis on lateral edge of L foot) noted.  Psychiatric: She has a normal mood and affect. Her behavior is normal.  Vitals reviewed.         Assessment & Plan:

## 2016-07-17 NOTE — Patient Instructions (Signed)
Schedule your complete physical in 3-4 months We'll notify you of your lab results and make any changes if needed We'll call you with your nutrition appt Continue to work on healthy diet and regular exercise- you can do it! Start the Protonix daily to improve your reflux Use the Lotrisone cream twice daily Call with any questions or concerns Hang in there! You can do it!!!

## 2016-07-17 NOTE — Assessment & Plan Note (Signed)
Ongoing issue for pt.  Will refer to Nutrition for assistance w/ meal planning and education.  Stressed need for regular exercise.  Pt is considering bariatric surgery but isn't sure yet.  Check labs to risk stratify.  Will follow

## 2016-08-07 ENCOUNTER — Other Ambulatory Visit: Payer: Self-pay | Admitting: Family Medicine

## 2016-08-14 ENCOUNTER — Other Ambulatory Visit: Payer: Self-pay | Admitting: Family Medicine

## 2016-08-17 ENCOUNTER — Telehealth: Payer: Self-pay | Admitting: Dietician

## 2016-08-17 ENCOUNTER — Encounter: Payer: Self-pay | Admitting: Family Medicine

## 2016-08-17 ENCOUNTER — Ambulatory Visit (INDEPENDENT_AMBULATORY_CARE_PROVIDER_SITE_OTHER): Payer: BC Managed Care – PPO | Admitting: Family Medicine

## 2016-08-17 VITALS — BP 121/76 | HR 95 | Temp 99.9°F | Ht 66.0 in | Wt 288.4 lb

## 2016-08-17 DIAGNOSIS — B9689 Other specified bacterial agents as the cause of diseases classified elsewhere: Secondary | ICD-10-CM

## 2016-08-17 DIAGNOSIS — J208 Acute bronchitis due to other specified organisms: Secondary | ICD-10-CM | POA: Diagnosis not present

## 2016-08-17 MED ORDER — BENZONATATE 100 MG PO CAPS: 100.0000 mg | ORAL_CAPSULE | Freq: Three times a day (TID) | ORAL | 0 refills | Status: DC | PRN

## 2016-08-17 MED ORDER — AZITHROMYCIN 250 MG PO TABS: ORAL_TABLET | ORAL | 0 refills | Status: DC

## 2016-08-17 NOTE — Telephone Encounter (Signed)
Nutrition appointment rescheduled for 08/25/16 at 4:00. Oran ReinLaura Jobe, RD, LDN

## 2016-08-17 NOTE — Progress Notes (Signed)
Pre visit review using our clinic review tool, if applicable. No additional management support is needed unless otherwise documented below in the visit note. 

## 2016-08-17 NOTE — Patient Instructions (Signed)
Continue to push fluids, practice good hand hygiene, and cover your mouth if you cough.  If you start having fevers, shaking or shortness of breath, seek immediate care.  

## 2016-08-17 NOTE — Progress Notes (Signed)
Chief Complaint  Patient presents with  . Sore Throat    Pt reports cough x1 day and has had sickness but went away No chills or body / Ptis a teacher    Marcia Chan here for URI complaints.  Duration: 1 day   Associated symptoms: cough, rhinorrhea, congestion, ST and fever Denies: itchy watery eyes, ear pain, ear drainage, shortness of breath and myalgia Treatment to date: sudafed, Mucinex DM, cough drops Sick contacts: Yes  ROS:  Const: + fevers HEENT: As noted in HPI Lungs: No SOB  Past Medical History:  Diagnosis Date  . GERD (gastroesophageal reflux disease)   . Hyperlipidemia   . Hypertension   . Vertigo    Family History  Problem Relation Age of Onset  . Arthritis Mother     OA/DJD  . Heart disease Mother   . Diabetes Mother   . Hypertension Mother     BP 121/76 (BP Location: Left Arm, Patient Position: Sitting, Cuff Size: Large)   Pulse 95   Temp 99.9 F (37.7 C) (Oral)   Ht 5\' 6"  (1.676 m)   Wt 288 lb 6.4 oz (130.8 kg)   SpO2 98%   BMI 46.55 kg/m  General: Awake, alert, appears stated age HEENT: AT, [place], ears patent b/l and TM's neg, nares patent w/o discharge, No sinus tenderness, pharynx pink and without exudates, MMM Neck: No masses or asymmetry Heart: RRR, no murmurs, no bruits Lungs: CTAB, no accessory muscle use Psych: Age appropriate judgment and insight, normal mood and affect  Acute bacterial bronchitis - Plan: azithromycin (ZITHROMAX) 250 MG tablet, benzonatate (TESSALON) 100 MG capsule  Orders as above. Given fevers, will treat.  Continue to push fluids, practice good hand hygiene, cover mouth when coughing. Letter for work given. F/u prn. If starting to experience fevers, shaking, or shortness of breath, seek immediate care. Pt voiced understanding and agreement to the plan.  Jilda Rocheicholas Paul Estell ManorWendling, DO 08/17/16 4:59 PM

## 2016-08-24 ENCOUNTER — Ambulatory Visit: Payer: BC Managed Care – PPO | Admitting: Dietician

## 2016-08-25 ENCOUNTER — Ambulatory Visit: Payer: BC Managed Care – PPO | Admitting: Dietician

## 2016-09-18 ENCOUNTER — Encounter: Payer: Self-pay | Admitting: Internal Medicine

## 2016-09-18 ENCOUNTER — Ambulatory Visit (INDEPENDENT_AMBULATORY_CARE_PROVIDER_SITE_OTHER): Payer: BC Managed Care – PPO | Admitting: Internal Medicine

## 2016-09-18 VITALS — BP 126/80 | HR 86 | Temp 98.0°F | Resp 14 | Ht 66.0 in | Wt 286.0 lb

## 2016-09-18 DIAGNOSIS — M545 Low back pain, unspecified: Secondary | ICD-10-CM

## 2016-09-18 MED ORDER — PREDNISONE 10 MG PO TABS: ORAL_TABLET | ORAL | 0 refills | Status: DC

## 2016-09-18 MED ORDER — TRAMADOL HCL 50 MG PO TABS: 50.0000 mg | ORAL_TABLET | Freq: Every evening | ORAL | 0 refills | Status: DC | PRN

## 2016-09-18 NOTE — Progress Notes (Signed)
Pre visit review using our clinic review tool, if applicable. No additional management support is needed unless otherwise documented below in the visit note. 

## 2016-09-18 NOTE — Patient Instructions (Signed)
Take prednisone as prescribed  Okay to take Ultram and a stromal) at night  IBUPROFEN (Advil or Motrin) 200 mg 2 tablets every 6 hours as needed for pain.  Always take it with food because may cause gastritis and ulcers.  If you notice nausea, stomach pain, change in the color of stools --->  Stop the medicine and let us know   Tylenol  500 mg OTC 2 tabs a day every 8 hours as needed for pain  Call if not gradually better  Call if severe symptoms        Back Exercises If you have pain in your back, do these exercises 2-3 times each day or as told by your doctor. When the pain goes away, do the exercises once each day, but repeat the steps more times for each exercise (do more repetitions). If you do not have pain in your back, do these exercises once each day or as told by your doctor. Exercises Single Knee to Chest   Do these steps 3-5 times in a row for each leg: 1. Lie on your back on a firm bed or the floor with your legs stretched out. 2. Bring one knee to your chest. 3. Hold your knee to your chest by grabbing your knee or thigh. 4. Pull on your knee until you feel a gentle stretch in your lower back. 5. Keep doing the stretch for 10-30 seconds. 6. Slowly let go of your leg and straighten it. Pelvic Tilt   Do these steps 5-10 times in a row: 1. Lie on your back on a firm bed or the floor with your legs stretched out. 2. Bend your knees so they point up to the ceiling. Your feet should be flat on the floor. 3. Tighten your lower belly (abdomen) muscles to press your lower back against the floor. This will make your tailbone point up to the ceiling instead of pointing down to your feet or the floor. 4. Stay in this position for 5-10 seconds while you gently tighten your muscles and breathe evenly.   Bridges   Do these steps 10 times in a row: 1. Lie on your back on a firm surface. 2. Bend your knees so they point up to the ceiling. Your feet should be flat on the  floor. 3. Tighten your butt muscles and lift your butt off of the floor until your waist is almost as high as your knees. If you do not feel the muscles working in your butt and the back of your thighs, slide your feet 1-2 inches farther away from your butt. 4. Stay in this position for 3-5 seconds. 5. Slowly lower your butt to the floor, and let your butt muscles relax. If this exercise is too easy, try doing it with your arms crossed over your chest.        Low Back Sprain Rehab Ask your health care provider which exercises are safe for you. Do exercises exactly as told by your health care provider and adjust them as directed. It is normal to feel mild stretching, pulling, tightness, or discomfort as you do these exercises, but you should stop right away if you feel sudden pain or your pain gets worse. Do not begin these exercises until told by your health care provider. Stretching and range of motion exercises These exercises warm up your muscles and joints and improve the movement and flexibility of your back. These exercises also help to relieve pain, numbness, and tingling. Exercise A:  Lumbar rotation   7. Lie on your back on a firm surface and bend your knees. 8. Straighten your arms out to your sides so each arm forms an "L" shape with a side of your body (a 90 degree angle). 9. Slowly move both of your knees to one side of your body until you feel a stretch in your lower back. Try not to let your shoulders move off of the floor. 10. Hold for __________ seconds. 11. Tense your abdominal muscles and slowly move your knees back to the starting position. 12. Repeat this exercise on the other side of your body. Repeat __________ times. Complete this exercise __________ times a day. Exercise B: Prone extension on elbows   1. Lie on your abdomen on a firm surface. 2. Prop yourself up on your elbows. 3. Use your arms to help lift your chest up until you feel a gentle stretch in your  abdomen and your lower back.  This will place some of your body weight on your elbows. If this is uncomfortable, try stacking pillows under your chest.  Your hips should stay down, against the surface that you are lying on. Keep your hip and back muscles relaxed. 4. Hold for __________ seconds. 5. Slowly relax your upper body and return to the starting position. Repeat __________ times. Complete this exercise __________ times a day. Strengthening exercises These exercises build strength and endurance in your back. Endurance is the ability to use your muscles for a long time, even after they get tired. Exercise C: Pelvic tilt  1. Lie on your back on a firm surface. Bend your knees and keep your feet flat. 2. Tense your abdominal muscles. Tip your pelvis up toward the ceiling and flatten your lower back into the floor.  To help with this exercise, you may place a small towel under your lower back and try to push your back into the towel. 3. Hold for __________ seconds. 4. Let your muscles relax completely before you repeat this exercise. Repeat __________ times. Complete this exercise __________ times a day. Exercise D: Alternating arm and leg raises   1. Get on your hands and knees on a firm surface. If you are on a hard floor, you may want to use padding to cushion your knees, such as an exercise mat. 2. Line up your arms and legs. Your hands should be below your shoulders, and your knees should be below your hips. 3. Lift your left leg behind you. At the same time, raise your right arm and straighten it in front of you.  Do not lift your leg higher than your hip.  Do not lift your arm higher than your shoulder.  Keep your abdominal and back muscles tight.  Keep your hips facing the ground.  Do not arch your back.  Keep your balance carefully, and do not hold your breath. 4. Hold for __________ seconds. 5. Slowly return to the starting position and repeat with your right leg and  your left arm. Repeat __________ times. Complete this exercise __________ times a day. Exercise E: Abdominal set with straight leg raise   1. Lie on your back on a firm surface. 2. Bend one of your knees and keep your other leg straight. 3. Tense your abdominal muscles and lift your straight leg up, 4-6 inches (10-15 cm) off the ground. 4. Keep your abdominal muscles tight and hold for __________ seconds.  Do not hold your breath.  Do not arch your back. Keep it  flat against the ground. 5. Keep your abdominal muscles tense as you slowly lower your leg back to the starting position. 6. Repeat with your other leg. Repeat __________ times. Complete this exercise __________ times a day. Posture and body mechanics   Body mechanics refers to the movements and positions of your body while you do your daily activities. Posture is part of body mechanics. Good posture and healthy body mechanics can help to relieve stress in your body's tissues and joints. Good posture means that your spine is in its natural S-curve position (your spine is neutral), your shoulders are pulled back slightly, and your head is not tipped forward. The following are general guidelines for applying improved posture and body mechanics to your everyday activities. Standing    When standing, keep your spine neutral and your feet about hip-width apart. Keep a slight bend in your knees. Your ears, shoulders, and hips should line up.  When you do a task in which you stand in one place for a long time, place one foot up on a stable object that is 2-4 inches (5-10 cm) high, such as a footstool. This helps keep your spine neutral. Sitting    When sitting, keep your spine neutral and keep your feet flat on the floor. Use a footrest, if necessary, and keep your thighs parallel to the floor. Avoid rounding your shoulders, and avoid tilting your head forward.  When working at a desk or a computer, keep your desk at a height where your  hands are slightly lower than your elbows. Slide your chair under your desk so you are close enough to maintain good posture.  When working at a computer, place your monitor at a height where you are looking straight ahead and you do not have to tilt your head forward or downward to look at the screen. Resting    When lying down and resting, avoid positions that are most painful for you.  If you have pain with activities such as sitting, bending, stooping, or squatting (flexion-based activities), lie in a position in which your body does not bend very much. For example, avoid curling up on your side with your arms and knees near your chest (fetal position).  If you have pain with activities such as standing for a long time or reaching with your arms (extension-based activities), lie with your spine in a neutral position and bend your knees slightly. Try the following positions:  Lying on your side with a pillow between your knees.  Lying on your back with a pillow under your knees. Lifting    When lifting objects, keep your feet at least shoulder-width apart and tighten your abdominal muscles.  Bend your knees and hips and keep your spine neutral. It is important to lift using the strength of your legs, not your back. Do not lock your knees straight out.  Always ask for help to lift heavy or awkward objects. This information is not intended to replace advice given to you by your health care provider. Make sure you discuss any questions you have with your health care provider. Document Released: 06/19/2005 Document Revised: 02/24/2016 Document Reviewed: 03/31/2015 Elsevier Interactive Patient Education  2017 [employer]

## 2016-09-18 NOTE — Progress Notes (Signed)
Subjective:    Patient ID: Marcia Chan, female    DOB: 11/16/1970, 46 y.o.   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 The next day she woke up with low back pain and  right buttock pain as well. Pain does not radiate to the legs. Definitely worse with certain positions and when she turns in bed. Took  Motrin, meloxicam with no much relief. Eventually her mother gave her tramadol, she took it every 4 hours with partial relief. She has episodic back pain, this is not the worst episode she ever had.  Review of Systems   Denies fever chills or rash No lower extremity tingling or paresthesias. Denies urinary symptoms  Past Medical History:  Diagnosis Date  . GERD (gastroesophageal reflux disease)   . Hyperlipidemia   . Hypertension   . Vertigo     No past surgical history on file.  Social History   Social History  . Marital status: Married    Spouse name: N/A  . Number of children: N/A  . Years of education: N/A   Occupational History  . Not on file.   Social History Main Topics  . Smoking status: Never Smoker  . Smokeless tobacco: Never Used  . Alcohol use No  . Drug use: No  . Sexual activity: Not on file   Other Topics Concern  . Not on file   Social History Narrative   G3 P1 A2      Allergies as of 09/18/2016      Reactions   Bextra [valdecoxib]    Rash      Medication List       Accurate as of 09/18/16  7:36 PM. Always use your most recent med list.          carvedilol 6.25 MG tablet Commonly known as:  COREG TAKE 1 TABLET BY MOUTH TWICE DAILY WITH A MEAL   pantoprazole 40 MG tablet Commonly known as:  PROTONIX Take 1 tablet (40 mg total) by mouth daily.   pravastatin 40 MG tablet Commonly known as:  PRAVACHOL TAKE 1 TABLET BY MOUTH DAILY. CALL OFFICE TO SCHEDULE A BLOOD PRESSURE AND CHOLESTEROL FOLLOW UP   predniSONE  10 MG tablet Commonly known as:  DELTASONE 5 tablets a day x 2 days, 4 tablets x 2 days, 3 tabs x 2 days, 2 tabs x 2 days, 1 tab x 2 days   traMADol 50 MG tablet Commonly known as:  ULTRAM Take 1 tablet (50 mg total) by mouth at bedtime as needed.   triamterene-hydrochlorothiazide 37.5-25 MG tablet Commonly known as:  MAXZIDE-25 TAKE 1 TABLET BY MOUTH DAILY          Objective:   Physical Exam BP 126/80 (BP Location: Left Arm, Patient Position: Sitting, Cuff Size: Normal)   Pulse 86   Temp 98 F (36.7 C) (Oral)   Resp 14   Ht 5\' 6"  (1.676 m)   Wt 286 lb (129.7 kg)   SpO2 97%   BMI 46.16 kg/m  General:   Well developed, well nourished . NAD.  HEENT:  Normocephalic . Face symmetric, atraumatic MSK: No TTP at the low back. No TTP at the trochanteric bursas. + Antalgic posture particularly when she lays down in the examining table Abdomen:  Not distended, soft, non-tender. No rebound or rigidity.  Skin: Not pale. Not jaundice Neurologic:  alert & oriented X3.  Speech normal, gait appropriate for age and unassisted. Motor and DTRs symmetric. Straight leg test negative Psych--  Cognition and judgment appear intact.  Cooperative with normal attention span and concentration.  Behavior appropriate. No anxious or depressed appearing.    Assessment & Plan:   46 year old lady with history of HTN, hyperlipidemia, birth control w/ IUD presents with the following: Acute lumbalgia: No evidence of radiculopathy or he other serious etiologies. Most likely a MSK pain. She is not diabetic. Recommend prednisone, judicious use of Motrin and Tylenol, side effects discussed. She got some Ultram with good response and tolerance, a small prescription provided for nighttime use. I also emphasized the need to do stretching and correct her posture to prevent future events.see AVS We'll call if no better

## 2016-10-02 ENCOUNTER — Telehealth: Payer: Self-pay | Admitting: Family Medicine

## 2016-10-02 MED ORDER — CYCLOBENZAPRINE HCL 10 MG PO TABS
10.0000 mg | ORAL_TABLET | Freq: Every evening | ORAL | 0 refills | Status: DC | PRN
Start: 2016-10-02 — End: 2016-12-14

## 2016-10-02 NOTE — Telephone Encounter (Signed)
Relation to ZO:XWRU Call back number:[phone] Pharmacy: [id number] - [place], [place] - [address]  Reason for call:  As per AVS: call if symptoms don't improve, patient states medication prescribed is not working

## 2016-10-02 NOTE — Telephone Encounter (Signed)
Spoke w/ Pt, informed of recommendations, Pt already has Ultram at home, not needing refill, informed Flexeril has been sent to [facility] pharmacy and to let us know if not improving for sports med referral. Pt verbalized understanding.

## 2016-10-02 NOTE — Telephone Encounter (Signed)
Pt seen 09/18/2016, please advise.

## 2016-10-02 NOTE — Telephone Encounter (Signed)
Continue OTC Motrin as needed. Call Ultram No. 15, no refills  We can also try a muscle relaxant, Flexeril 10 mg one by mouth daily at bedtime #21, no refills. Will cause drowsiness See PCP if not better or call for a sports medicine referral.

## 2016-12-04 ENCOUNTER — Other Ambulatory Visit: Payer: Self-pay | Admitting: Family Medicine

## 2016-12-14 ENCOUNTER — Ambulatory Visit (INDEPENDENT_AMBULATORY_CARE_PROVIDER_SITE_OTHER): Payer: BC Managed Care – PPO | Admitting: Family Medicine

## 2016-12-14 ENCOUNTER — Encounter: Payer: Self-pay | Admitting: Family Medicine

## 2016-12-14 VITALS — BP 122/86 | HR 77 | Temp 98.6°F | Resp 16 | Ht 66.0 in | Wt 286.0 lb

## 2016-12-14 DIAGNOSIS — E559 Vitamin D deficiency, unspecified: Secondary | ICD-10-CM | POA: Insufficient documentation

## 2016-12-14 DIAGNOSIS — Z0000 Encounter for general adult medical examination without abnormal findings: Secondary | ICD-10-CM

## 2016-12-14 DIAGNOSIS — I10 Essential (primary) hypertension: Secondary | ICD-10-CM

## 2016-12-14 HISTORY — DX: Vitamin D deficiency, unspecified: E55.9

## 2016-12-14 LAB — CBC WITH DIFFERENTIAL/PLATELET
Basophils Absolute: 0 K/uL (ref 0.0–0.1)
Basophils Relative: 0.3 % (ref 0.0–3.0)
Eosinophils Absolute: 0.1 K/uL (ref 0.0–0.7)
Eosinophils Relative: 1.2 % (ref 0.0–5.0)
HCT: 39.1 % (ref 36.0–46.0)
Hemoglobin: 12.9 g/dL (ref 12.0–15.0)
Lymphocytes Relative: 24.5 % (ref 12.0–46.0)
Lymphs Abs: 1.5 K/uL (ref 0.7–4.0)
MCHC: 33 g/dL (ref 30.0–36.0)
MCV: 90.1 fl (ref 78.0–100.0)
Monocytes Absolute: 0.8 K/uL (ref 0.1–1.0)
Monocytes Relative: 12.5 % — ABNORMAL HIGH (ref 3.0–12.0)
Neutro Abs: 3.7 K/uL (ref 1.4–7.7)
Neutrophils Relative %: 61.5 % (ref 43.0–77.0)
Platelets: 242 K/uL (ref 150.0–400.0)
RBC: 4.34 Mil/uL (ref 3.87–5.11)
RDW: 12.9 % (ref 11.5–15.5)
WBC: 6 K/uL (ref 4.0–10.5)

## 2016-12-14 LAB — BASIC METABOLIC PANEL
BUN: 9 mg/dL (ref 6–23)
CALCIUM: 9.3 mg/dL (ref 8.4–10.5)
CO2: 31 mEq/L (ref 19–32)
Chloride: 101 mEq/L (ref 96–112)
Creatinine, Ser: 0.76 mg/dL (ref 0.40–1.20)
GFR: 104.97 mL/min (ref >60.00)
GLUCOSE: 97 mg/dL (ref 70–99)
Potassium: 4 mEq/L (ref 3.5–5.1)
SODIUM: 137 meq/L (ref 135–145)

## 2016-12-14 LAB — LIPID PANEL
Cholesterol: 146 mg/dL (ref 0–200)
HDL: 36.5 mg/dL — ABNORMAL LOW
LDL Cholesterol: 99 mg/dL (ref 0–99)
NonHDL: 109.92
Total CHOL/HDL Ratio: 4
Triglycerides: 56 mg/dL (ref 0.0–149.0)
VLDL: 11.2 mg/dL (ref 0.0–40.0)

## 2016-12-14 LAB — HEPATIC FUNCTION PANEL
ALT: 13 U/L (ref 0–35)
AST: 13 U/L (ref 0–37)
Albumin: 3.9 g/dL (ref 3.5–5.2)
Alkaline Phosphatase: 54 U/L (ref 39–117)
BILIRUBIN DIRECT: 0.1 mg/dL (ref 0.0–0.3)
TOTAL PROTEIN: 6.9 g/dL (ref 6.0–8.3)
Total Bilirubin: 0.6 mg/dL (ref 0.2–1.2)

## 2016-12-14 LAB — TSH: TSH: 1.7 u[IU]/mL (ref 0.35–4.50)

## 2016-12-14 LAB — VITAMIN D 25 HYDROXY (VIT D DEFICIENCY, FRACTURES): VITD: 17.17 ng/mL — ABNORMAL LOW (ref 30.00–100.00)

## 2016-12-14 NOTE — Progress Notes (Signed)
Pre visit review using our clinic review tool, if applicable. No additional management support is needed unless otherwise documented below in the visit note. 

## 2016-12-14 NOTE — Assessment & Plan Note (Signed)
Pt's PE WNL w/ exception of obesity.  UTD on GYN, Tdap.  Check labs.  Anticipatory guidance provided.  

## 2016-12-14 NOTE — Assessment & Plan Note (Signed)
Pt has hx of this.  Check labs.  Replete prn. 

## 2016-12-14 NOTE — Patient Instructions (Signed)
Follow up in 6 months to recheck BP and cholesterol We'll notify you of your lab results and make any changes if needed Continue to work on healthy diet and regular exercise- you can do it!! Call with any questions or concerns Have a great summer!!! 

## 2016-12-14 NOTE — Progress Notes (Signed)
   Subjective:    Patient ID: Marcia Chan, female    DOB: 04/18/1971, 46 y.o.   MRN: [id number]  HPI CPE- UTD on pap (Jan/Feb this year), mammo (Dr Silvestre Gunner'Keeffe).  UTD on Tdap.     Review of Systems Patient reports no vision/ hearing changes, adenopathy,fever, weight change,  persistant/recurrent hoarseness , swallowing issues, chest pain, palpitations, edema, persistant/recurrent cough, hemoptysis, dyspnea (rest/exertional/paroxysmal nocturnal), gastrointestinal bleeding (melena, rectal bleeding), abdominal pain, significant heartburn, bowel changes, GU symptoms (dysuria, hematuria, incontinence), Gyn symptoms (abnormal  bleeding, pain),  syncope, focal weakness, memory loss, numbness & tingling, skin/hair/nail changes, abnormal bruising or bleeding, anxiety, or depression.     Objective:   Physical Exam General Appearance:    Alert, cooperative, no distress, appears stated age  Head:    Normocephalic, without obvious abnormality, atraumatic  Eyes:    PERRL, conjunctiva/corneas clear, EOM's intact, fundi    benign, both eyes  Ears:    Normal TM's and external ear canals, both ears  Nose:   Nares normal, septum midline, mucosa normal, no drainage    or sinus tenderness  Throat:   Lips, mucosa, and tongue normal; teeth and gums normal  Neck:   Supple, symmetrical, trachea midline, no adenopathy;    Thyroid: no enlargement/tenderness/nodules  Back:     Symmetric, no curvature, ROM normal, no CVA tenderness  Lungs:     Clear to auscultation bilaterally, respirations unlabored  Chest Wall:    No tenderness or deformity   Heart:    Regular rate and rhythm, S1 and S2 normal, no murmur, rub   or gallop  Breast Exam:    Deferred to GYN  Abdomen:     Soft, non-tender, bowel sounds active all four quadrants,    no masses, no organomegaly  Genitalia:    Deferred to GYN  Rectal:    Extremities:   Extremities normal, atraumatic, no cyanosis or edema  Pulses:   2+ and symmetric all extremities    Skin:   Skin color, texture, turgor normal, no rashes or lesions  Lymph nodes:   Cervical, supraclavicular, and axillary nodes normal  Neurologic:   CNII-XII intact, normal strength, sensation and reflexes    throughout          Assessment & Plan:

## 2016-12-14 NOTE — Assessment & Plan Note (Signed)
Chronic problem.  Well controlled today.  Asymptomatic.  Check labs.  No anticipated med changes.  Will follow. 

## 2016-12-15 ENCOUNTER — Other Ambulatory Visit: Payer: Self-pay | Admitting: General Practice

## 2016-12-15 MED ORDER — VITAMIN D (ERGOCALCIFEROL) 1.25 MG (50000 UNIT) PO CAPS: 50000.0000 [IU] | ORAL_CAPSULE | ORAL | 0 refills | Status: DC

## 2017-01-12 ENCOUNTER — Encounter: Payer: Self-pay | Admitting: Medical

## 2017-01-12 ENCOUNTER — Other Ambulatory Visit: Payer: Self-pay | Admitting: Medical

## 2017-01-12 ENCOUNTER — Ambulatory Visit (INDEPENDENT_AMBULATORY_CARE_PROVIDER_SITE_OTHER): Payer: BC Managed Care – PPO | Admitting: Medical

## 2017-01-12 ENCOUNTER — Ambulatory Visit: Payer: BC Managed Care – PPO | Admitting: Internal Medicine

## 2017-01-12 ENCOUNTER — Ambulatory Visit (HOSPITAL_BASED_OUTPATIENT_CLINIC_OR_DEPARTMENT_OTHER)
Admission: RE | Admit: 2017-01-12 | Discharge: 2017-01-12 | Disposition: A | Discharge status: Home / Self Care | Payer: BC Managed Care – PPO | Source: Ambulatory Visit | Attending: Medical | Admitting: Medical

## 2017-01-12 VITALS — BP 136/77 | HR 87 | Temp 98.3°F | Ht 66.0 in | Wt 289.2 lb

## 2017-01-12 DIAGNOSIS — M79609 Pain in unspecified limb: Secondary | ICD-10-CM

## 2017-01-12 DIAGNOSIS — M791 Myalgia: Secondary | ICD-10-CM | POA: Diagnosis not present

## 2017-01-12 DIAGNOSIS — S76312A Strain of muscle, fascia and tendon of the posterior muscle group at thigh level, left thigh, initial encounter: Secondary | ICD-10-CM

## 2017-01-12 DIAGNOSIS — M7918 Myalgia, other site: Secondary | ICD-10-CM

## 2017-01-12 DIAGNOSIS — M25552 Pain in left hip: Secondary | ICD-10-CM

## 2017-01-12 MED ORDER — TRAMADOL HCL 50 MG PO TABS: 50.0000 mg | ORAL_TABLET | Freq: Four times a day (QID) | ORAL | 0 refills | Status: DC | PRN

## 2017-01-12 MED ORDER — CYCLOBENZAPRINE HCL 5 MG PO TABS: 5.0000 mg | ORAL_TABLET | Freq: Every day | ORAL | 0 refills | Status: DC

## 2017-01-12 NOTE — Patient Instructions (Addendum)
For recent pain left hip, left hamstring and popliteal region pain will get xray of left hip and US of left lower ext.  Will rx advil  for pain+ inflammation.  Refill your cyclobenzaprine.  If pain increases despite above then tramadol.  Try to do some stretching exercises as tolerated.  Follow up in 7 days pcp or as needed

## 2017-01-12 NOTE — Progress Notes (Signed)
Subjective:    Patient ID: Marcia Chan, female    DOB: December 14, 1970, 46 y.o.   MRN: [id number]  HPI   Pt in with some pain in her left mid buttock area since wed afternoon. She first felt pull in buttock. But gradually over last 2 days pain increasing and now running down hamstring area all th way down to lower calf. Pain last 24 hours getting high level. Pain more at night.   Pt states taken advil and old cyclobenzaprine prescription. Helped some.   LMP- has mirena.  Pt has not been exercising.    Review of Systems  Constitutional: Negative for chills, fatigue and fever.  Respiratory: Negative for cough, chest tightness, shortness of breath and wheezing.   Cardiovascular: Negative for chest pain and palpitations.  Gastrointestinal: Negative for abdominal pain.  Musculoskeletal:       Hip pain. Mild on exam Lt buttock pain. Hamsting pain. Popliteal pain.    Past Medical History:  Diagnosis Date  . GERD (gastroesophageal reflux disease)   . Hyperlipidemia   . Hypertension   . Vertigo      Social History   Social History  . Marital status: Married    Spouse name: N/A  . Number of children: N/A  . Years of education: N/A   Occupational History  . Not on file.   Social History Main Topics  . Smoking status: Never Smoker  . Smokeless tobacco: Never Used  . Alcohol use No  . Drug use: No  . Sexual activity: Not on file   Other Topics Concern  . Not on file   Social History Narrative   G3 P1 A2    No past surgical history on file.  Family History  Problem Relation Age of Onset  . Arthritis Mother        OA/DJD  . Heart disease Mother   . Diabetes Mother   . Hypertension Mother     Allergies  Allergen Reactions  . Bextra [Valdecoxib]     Rash     Current Outpatient Prescriptions on File Prior to Visit  Medication Sig Dispense Refill  . carvedilol (COREG) 6.25 MG tablet TAKE 1 TABLET BY MOUTH TWICE DAILY WITH A MEAL 180 tablet 1  .  pantoprazole (PROTONIX) 40 MG tablet TAKE 1 TABLET(40 MG) BY MOUTH DAILY 30 tablet 6  . pravastatin (PRAVACHOL) 40 MG tablet TAKE 1 TABLET BY MOUTH DAILY. CALL OFFICE TO SCHEDULE A BLOOD PRESSURE AND CHOLESTEROL FOLLOW UP 30 tablet 6  . triamterene-hydrochlorothiazide (MAXZIDE-25) 37.5-25 MG tablet TAKE 1 TABLET BY MOUTH DAILY 90 tablet 1  . Vitamin D, Ergocalciferol, (DRISDOL) 50000 units CAPS capsule Take 1 capsule (50,000 Units total) by mouth every 7 (seven) days. 12 capsule 0   No current facility-administered medications on file prior to visit.     BP 136/77 (BP Location: Left Arm, Patient Position: Sitting, Cuff Size: Large)   Pulse 87   Temp 98.3 F (36.8 C) (Oral)   Ht 5\' 6"  (1.676 m)   Wt 289 lb 3.2 oz (131.2 kg)   BMI 46.68 kg/m       Objective:   Physical Exam  General- No acute distress. Pleasant patient. Neck- Full range of motion, no jvd Lungs- Clear, even and unlabored. Heart- regular rate and rhythm. Neurologic- CNII- XII grossly intact.  Back- no mid lumbar pain. No direct si tenderness on either side.  Left hip- mild pain on rom. Left buttox- mild pain on palpation mid aspect.  Left popliteal area- mild tenderness lateral popliteal fossae.Terminal point of bicep femoris muscle. Posterior leg- lateral aspect of left leg full length of bicep femorus muscle mild tender.      Assessment & Plan:  For recent pain left hip, left hamstring and popliteal region pain will get xray of left hip and US of left lower ext.  Will rx advil  for pain+ inflammation.  Refill your cyclobenzaprine.  If pain increases despite above then tramadol.  Try to do some stretching exercises as tolerated.  Follow up in 7 days pcp or as needed  Kmya Placide, Ramon DredgeEdward, [employer]F CorporationPA-C

## 2017-01-30 ENCOUNTER — Other Ambulatory Visit: Payer: Self-pay | Admitting: Family Medicine

## 2017-01-30 NOTE — Telephone Encounter (Signed)
Medication filled to pharmacy as requested.   

## 2017-03-12 ENCOUNTER — Other Ambulatory Visit: Payer: Self-pay | Admitting: Family Medicine

## 2017-03-23 ENCOUNTER — Other Ambulatory Visit: Payer: Self-pay | Admitting: Family Medicine

## 2017-04-12 ENCOUNTER — Ambulatory Visit (INDEPENDENT_AMBULATORY_CARE_PROVIDER_SITE_OTHER): Payer: BC Managed Care – PPO | Admitting: Medical

## 2017-04-12 ENCOUNTER — Encounter: Payer: Self-pay | Admitting: Medical

## 2017-04-12 ENCOUNTER — Ambulatory Visit (HOSPITAL_BASED_OUTPATIENT_CLINIC_OR_DEPARTMENT_OTHER)
Admission: RE | Admit: 2017-04-12 | Discharge: 2017-04-12 | Disposition: A | Discharge status: Home / Self Care | Payer: BC Managed Care – PPO | Source: Ambulatory Visit | Attending: Medical | Admitting: Medical

## 2017-04-12 VITALS — BP 125/85 | HR 79 | Temp 98.3°F | Resp 16 | Ht 66.0 in | Wt 289.6 lb

## 2017-04-12 DIAGNOSIS — M25522 Pain in left elbow: Secondary | ICD-10-CM | POA: Diagnosis not present

## 2017-04-12 DIAGNOSIS — M778 Other enthesopathies, not elsewhere classified: Secondary | ICD-10-CM | POA: Diagnosis not present

## 2017-04-12 DIAGNOSIS — Z23 Encounter for immunization: Secondary | ICD-10-CM

## 2017-04-12 MED ORDER — NAPROXEN 500 MG PO TABS: 500.0000 mg | ORAL_TABLET | Freq: Two times a day (BID) | ORAL | 0 refills | Status: DC

## 2017-04-12 NOTE — Patient Instructions (Addendum)
For elbow pain/lateral epicondylitis rx naprosyn. Also use tennis elbow brace and try to minimize use of elbow/lt arm. Refer to sports med.  Get xray today.  Follow up in 10-14 days or as needed.   Tennis Elbow Tennis elbow is puffiness (inflammation) of the outer tendons of your forearm close to your elbow. Your tendons attach your muscles to your bones. Tennis elbow can happen in any sport or job in which you use your elbow too much. It is caused by doing the same motion over and over. Tennis elbow can cause:  Pain and tenderness in your forearm and the outer part of your elbow.  A burning feeling. This runs from your elbow through your arm.  Weak grip in your hands.  Follow these instructions at home: Activity  Rest your elbow and wrist as told by your doctor. Try to avoid any activities that caused the problem until your doctor says that you can do them again.  If a physical therapist teaches you exercises, do all of them as told.  If you lift an object, lift it with your palm facing up. This is easier on your elbow. Lifestyle  If your tennis elbow is caused by sports, check your equipment and make sure that: ? You are using it correctly. ? It fits you well.  If your tennis elbow is caused by work, take breaks often, if you are able. Talk with your manager about doing your work in a way that is safe for you. ? If your tennis elbow is caused by computer use, talk with your manager about any changes that can be made to your work setup. General instructions  If told, apply ice to the painful area: ? Put ice in a plastic bag. ? Place a towel between your skin and the bag. ? Leave the ice on for 20 minutes, 2-3 times per day.  Take medicines only as told by your doctor.  If you were given a brace, wear it as told by your doctor.  Keep all follow-up visits as told by your doctor. This is important. Contact a doctor if:  Your pain does not get better with treatment.  Your  pain gets worse.  You have weakness in your forearm, hand, or fingers.  You cannot feel your forearm, hand, or fingers. This information is not intended to replace advice given to you by your health care provider. Make sure you discuss any questions you have with your health care provider. Document Released: 12/07/2009 Document Revised: 02/17/2016 Document Reviewed: 06/15/2014 Elsevier Interactive Patient Education  [employer].

## 2017-04-12 NOTE — Progress Notes (Signed)
Subjective:    Patient ID: Marcia Chan, female    DOB: 1971/01/08, 46 y.o.   MRN: [id number]  HPI  Pt in for left elbow pain. Pt states pain has for one year. Pain is always present but at time pain flares/increases. 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 Pt is rt handed. Pt notes pain increases in elbow when pronates and supinates. And flexes/extends left wrist pain increases in elbow.  In laundry room when doing work with left hand only pain increases  Pt has been alternating tylenol and ibuprofen at times.  LMP- mirena.     Review of Systems  Constitutional: Negative for chills, fatigue and fever.  Respiratory: Negative for cough, chest tightness, shortness of breath and wheezing.   Cardiovascular: Negative for chest pain and palpitations.  Musculoskeletal: Negative for back pain, joint swelling and myalgias.       Left elbow pain/lateral epicondyle. No shoulder or upper arm pain.  Skin: Negative for rash.  Neurological: Negative for dizziness, seizures, speech difficulty, weakness and headaches.  Hematological: Negative for adenopathy. Does not bruise/bleed easily.  Psychiatric/Behavioral: Negative for agitation, confusion and decreased concentration.    Past Medical History:  Diagnosis Date  . GERD (gastroesophageal reflux disease)   . Hyperlipidemia   . Hypertension   . Vertigo      Social History   Social History  . Marital status: Married    Spouse name: N/A  . Number of children: N/A  . Years of education: N/A   Occupational History  . Not on file.   Social History Main Topics  . Smoking status: Never Smoker  . Smokeless tobacco: Never Used  . Alcohol use No  . Drug use: No  . Sexual activity: Not on file   Other Topics Concern  . Not on file   Social History Narrative   G3 P1 A2    No past surgical history on file.  Family History  Problem Relation Age of Onset  . Arthritis Mother        OA/DJD  . Heart disease Mother     . Diabetes Mother   . Hypertension Mother     Allergies  Allergen Reactions  . Bextra [Valdecoxib]     Rash     Current Outpatient Prescriptions on File Prior to Visit  Medication Sig Dispense Refill  . carvedilol (COREG) 6.25 MG tablet TAKE 1 TABLET BY MOUTH TWICE DAILY WITH A MEAL 180 tablet 1  . clotrimazole-betamethasone (LOTRISONE) cream APPLY EXTERNALLY TO THE AFFECTED AREA TWICE DAILY 30 g 0  . pantoprazole (PROTONIX) 40 MG tablet TAKE 1 TABLET(40 MG) BY MOUTH DAILY 30 tablet 6  . pravastatin (PRAVACHOL) 40 MG tablet TAKE 1 TABLET BY MOUTH DAILY. CALL OFFICE TO SCHEDULE A BLOOD PRESSURE AND CHOLESTEROL FOLLOW UP 30 tablet 6  . triamterene-hydrochlorothiazide (MAXZIDE-25) 37.5-25 MG tablet TAKE 1 TABLET BY MOUTH DAILY 90 tablet 1  . cyclobenzaprine (FLEXERIL) 5 MG tablet Take 1 tablet (5 mg total) by mouth at bedtime. (Patient not taking: Reported on 04/12/2017) 14 tablet 0   No current facility-administered medications on file prior to visit.     BP 125/85   Pulse 79   Temp 98.3 F (36.8 C) (Oral)   Resp 16   Ht  (1.676 m)   Wt 289 lb 9.6 oz (131.4 kg)   SpO2 98%   BMI 46.74 kg/m       Objective:   Physical Exam  General- No acute distress. Pleasant patient. Neck- Full range of motion, no jvd Lungs- Clear, even and unlabored. Heart- regular rate and rhythm. Neurologic- CNII- XII grossly intact.  Left elbow- lateral elbow pain. Direct tenderness of lateral epicondyle. Pain increases in lateral elbow area on flexion and extension of wrist.        Assessment & Plan:  For elbow pain/lateral epicondylitis rx naprosyn. Also use tennis elbow brace and try to minimize use of elbow/lt arm. Refer to sports med.  Get xray today.  Follow up in 10-14 days or as needed.  Damire Remedios, Ramon Dredge, PA-C

## 2017-04-16 ENCOUNTER — Ambulatory Visit: Payer: BC Managed Care – PPO | Admitting: Family Medicine

## 2017-04-17 ENCOUNTER — Ambulatory Visit: Payer: BC Managed Care – PPO

## 2017-04-19 ENCOUNTER — Ambulatory Visit: Payer: BC Managed Care – PPO | Admitting: Family Medicine

## 2017-05-04 ENCOUNTER — Ambulatory Visit (INDEPENDENT_AMBULATORY_CARE_PROVIDER_SITE_OTHER): Payer: BC Managed Care – PPO | Admitting: Physician Assistant

## 2017-05-04 ENCOUNTER — Telehealth: Payer: Self-pay | Admitting: Family Medicine

## 2017-05-04 ENCOUNTER — Encounter: Payer: Self-pay | Admitting: Physician Assistant

## 2017-05-04 VITALS — BP 118/84 | HR 78 | Temp 99.0°F | Resp 14 | Ht 66.0 in | Wt 289.0 lb

## 2017-05-04 DIAGNOSIS — E010 Iodine-deficiency related diffuse (endemic) goiter: Secondary | ICD-10-CM

## 2017-05-04 DIAGNOSIS — R002 Palpitations: Secondary | ICD-10-CM | POA: Diagnosis not present

## 2017-05-04 NOTE — Telephone Encounter (Signed)
Could you call and triage pt?  

## 2017-05-04 NOTE — Patient Instructions (Signed)
Please stay well-hydrated and get plenty of rest.  No caffeine or alcohol.  Eat a well-balanced diet.   I am checking labs today. I also feel that your thyroid is slightly enlarged. I am getting an Ultrasound to further assess. Will call you with appointment.  You will also be contacted for a holter Study.  If you have any worsening symptoms or any chest pain, lightheadedness or shortness of breath, please have someone carry you to the ER or call 911.

## 2017-05-04 NOTE — Telephone Encounter (Signed)
[facility] [place] [facility] - [facility] TELEPHONE ADVICE RECORD [facility] Patient Name: Marcia KalataFRANCESCA [name] DOB: 12/02/1970 Initial Comment Caller states a week ago she missed a dose of carvedilol . Ever since she missed the dose she has been having heart palpitations Nurse Assessment Nurse: Katrina Stackranmore, RN, Dahlia ClientHannah Date/Time (Eastern Time): 05/04/2017 11:39:22 AM Confirm and document reason for call. If symptomatic, describe symptoms. ---Caller states a week ago she missed a dose of carvedilol . Ever since she missed the dose she has been having heart palpitations. She normally takes 6.25mg  twice a day. Does the patient have any new or worsening symptoms? ---Yes Will a triage be completed? ---Yes Related visit to physician within the last 2 weeks? ---No Does the PT have any chronic conditions? (i.e. diabetes, asthma, etc.) ---Yes List chronic conditions. ---HTN, Is the patient pregnant or possibly pregnant? (Ask all females between the ages of 6012-55) ---No Is this a behavioral health or substance abuse call? ---No Guidelines Guideline Title Affirmed Question Affirmed Notes Heart Rate and Heartbeat Questions [1] Skipped or extra beat(s) AND [2] increases with exercise or exertion Final Disposition User See Physician within 4 Hours (or PCP triage) Katrina Stackranmore, RN, Hannah Referrals REFERRED TO PCP OFFICE Caller Disagree/Comply Comply Caller Understands Yes PreDisposition Call Doctor

## 2017-05-04 NOTE — Progress Notes (Signed)
Pre visit review using our clinic review tool, if applicable. No additional management support is needed unless otherwise documented below in the visit note. 

## 2017-05-04 NOTE — Progress Notes (Signed)
Patient presents to clinic today c/o intermittent palpitations over the past two weeks. Patient states that symptoms began after missing one dose of her carvedilol. These palpitations are described as a skipped beat sensation that happens a few times before stopping. States these episodes occur 1-2 times per day and last about a few seconds each. Denies chest pain, lightheadedness, dizziness or shortness of breath. She does note increased stress levels recently. Is not keeping hydrated. Denies alcohol use or caffeine consumption.  Past Medical History:  Diagnosis Date  . GERD (gastroesophageal reflux disease)   . Hyperlipidemia   . Hypertension   . Vertigo     Current Outpatient Prescriptions on File Prior to Visit  Medication Sig Dispense Refill  . carvedilol (COREG) 6.25 MG tablet TAKE 1 TABLET BY MOUTH TWICE DAILY WITH A MEAL 180 tablet 1  . clotrimazole-betamethasone (LOTRISONE) cream APPLY EXTERNALLY TO THE AFFECTED AREA TWICE DAILY 30 g 0  . pantoprazole (PROTONIX) 40 MG tablet TAKE 1 TABLET(40 MG) BY MOUTH DAILY 30 tablet 6  . pravastatin (PRAVACHOL) 40 MG tablet TAKE 1 TABLET BY MOUTH DAILY. CALL OFFICE TO SCHEDULE A BLOOD PRESSURE AND CHOLESTEROL FOLLOW UP 30 tablet 6  . triamterene-hydrochlorothiazide (MAXZIDE-25) 37.5-25 MG tablet TAKE 1 TABLET BY MOUTH DAILY 90 tablet 1   No current facility-administered medications on file prior to visit.     Allergies  Allergen Reactions  . Bextra [Valdecoxib]     Rash     Family History  Problem Relation Age of Onset  . Arthritis Mother        OA/DJD  . Heart disease Mother   . Diabetes Mother   . Hypertension Mother     Social History   Social History  . Marital status: Married    Spouse name: N/A  . Number of children: N/A  . Years of education: N/A   Social History Main Topics  . Smoking status: Never Smoker  . Smokeless tobacco: Never Used  . Alcohol use No  . Drug use: No  . Sexual activity: Not Asked    Other Topics Concern  . None   Social History Narrative   G3 P1 A2   Review of Systems - See HPI.  All other ROS are negative.  BP 118/84   Pulse 78   Temp 99 F (37.2 C) (Oral)   Resp 14   Ht 5' 6"  (1.676 m)   Wt 289 lb (131.1 kg)   SpO2 98%   BMI 46.65 kg/m   Physical Exam  Constitutional: She is oriented to person, place, and time and well-developed, well-nourished, and in no distress.  HENT:  Head: Normocephalic and atraumatic.  Eyes: Pupils are equal, round, and reactive to light. Conjunctivae are normal.  Neck: Neck supple. Thyromegaly (No palpable nodule) present.  Cardiovascular: Normal rate, regular rhythm, normal heart sounds and intact distal pulses.   Pulmonary/Chest: Effort normal and breath sounds normal. No respiratory distress. She has no wheezes. She has no rales. She exhibits no tenderness.  Neurological: She is alert and oriented to person, place, and time.  Skin: Skin is warm and dry. No rash noted.  Psychiatric: Affect normal.  Vitals reviewed.  Assessment/Plan: 1. Palpitations Sounds like PVCs although non captured on EKG in office. Will check CBC, CMP, TSH. Holter Study ordered. Patient is not adequately hydrating. Increase fluids. No caffeine or alcohol. Continue Carvedilol as directed. - EKG 12-Lead - Holter monitor - 48 hour; Future - CBC w/Diff - Comp Met (CMET) -  TSH  2. Thyromegaly Noted on exam. No palpable nodule. Will check TSH and Thyroid US.   Leeanne Rio, PA-C

## 2017-05-04 NOTE — Telephone Encounter (Signed)
Patient called office to report she is experiencing heart palpitations.  Per patient this started after she missed one dose of her blood pressure medication.  Attempted to transfer patient to triage nurse but patient disconnected the call.  I call patient back but received her voicemail.

## 2017-05-04 NOTE — Telephone Encounter (Signed)
Noted  

## 2017-05-05 LAB — CBC WITH DIFFERENTIAL/PLATELET
BASOS ABS: 21 {cells}/uL (ref 0–200)
Basophils Relative: 0.3 %
EOS PCT: 1.7 %
Eosinophils Absolute: 117 cells/uL (ref 15–500)
HCT: 37.8 % (ref 35.0–45.0)
HEMOGLOBIN: 12.8 g/dL (ref 11.7–15.5)
LYMPHS ABS: 2118 {cells}/uL (ref 850–3900)
MCH: 29.5 pg (ref 27.0–33.0)
MCHC: 33.9 g/dL (ref 32.0–36.0)
MCV: 87.1 fL (ref 80.0–100.0)
MONOS PCT: 10.8 %
MPV: 10.7 fL (ref 7.5–12.5)
NEUTROS ABS: 3899 {cells}/uL (ref 1500–7800)
NEUTROS PCT: 56.5 %
Platelets: 256 10*3/uL (ref 140–400)
RBC: 4.34 10*6/uL (ref 3.80–5.10)
RDW: 11.6 % (ref 11.0–15.0)
Total Lymphocyte: 30.7 %
WBC: 6.9 10*3/uL (ref 3.8–10.8)
WBCMIX: 745 {cells}/uL (ref 200–950)

## 2017-05-05 LAB — TSH: TSH: 0.87 mIU/L

## 2017-05-05 LAB — COMPREHENSIVE METABOLIC PANEL
AG RATIO: 1.3 (calc) (ref 1.0–2.5)
ALT: 12 U/L (ref 6–29)
AST: 14 U/L (ref 10–35)
Albumin: 4.1 g/dL (ref 3.6–5.1)
Alkaline phosphatase (APISO): 53 U/L (ref 33–115)
BILIRUBIN TOTAL: 0.6 mg/dL (ref 0.2–1.2)
BUN: 11 mg/dL (ref 7–25)
CALCIUM: 9.3 mg/dL (ref 8.6–10.2)
CO2: 28 mmol/L (ref 20–32)
Chloride: 101 mmol/L (ref 98–110)
Creat: 0.8 mg/dL (ref 0.50–1.10)
GLUCOSE: 92 mg/dL (ref 65–99)
Globulin: 3.1 g/dL (calc) (ref 1.9–3.7)
Potassium: 3.8 mmol/L (ref 3.5–5.3)
Sodium: 138 mmol/L (ref 135–146)
Total Protein: 7.2 g/dL (ref 6.1–8.1)

## 2017-06-08 ENCOUNTER — Other Ambulatory Visit: Payer: BC Managed Care – PPO

## 2017-06-27 ENCOUNTER — Ambulatory Visit: Payer: BC Managed Care – PPO | Admitting: Family Medicine

## 2017-07-04 ENCOUNTER — Ambulatory Visit: Payer: BC Managed Care – PPO | Admitting: Family Medicine

## 2017-07-04 ENCOUNTER — Telehealth: Payer: Self-pay | Admitting: Family Medicine

## 2017-07-04 ENCOUNTER — Other Ambulatory Visit: Payer: Self-pay | Admitting: *Deleted

## 2017-07-04 MED ORDER — PRAVASTATIN SODIUM 40 MG PO TABS: ORAL_TABLET | ORAL | 6 refills | Status: DC

## 2017-07-04 MED ORDER — PANTOPRAZOLE SODIUM 40 MG PO TBEC: DELAYED_RELEASE_TABLET | ORAL | 6 refills | Status: DC

## 2017-07-04 NOTE — Telephone Encounter (Signed)
Copied from CRM [phone]. Topic: Quick Communication - See Telephone Encounter >> Jul 04, 2017 10:34 AM Eston Mouldavis, Tobenna Needs B wrote: CRM for notification. See Telephone encounter for:  Refill pantoprazole and pravastatin   07/04/17.  [facility] [phone] - [place], [place] - [address]

## 2017-07-04 NOTE — Telephone Encounter (Signed)
Rx for chronic medications refilled- patient has appointment scheduled 07/12/17

## 2017-07-12 ENCOUNTER — Ambulatory Visit: Payer: BC Managed Care – PPO | Admitting: Family Medicine

## 2017-07-12 ENCOUNTER — Encounter: Payer: Self-pay | Admitting: Family Medicine

## 2017-07-12 ENCOUNTER — Other Ambulatory Visit: Payer: Self-pay

## 2017-07-12 VITALS — BP 122/82 | HR 87 | Temp 98.0°F | Resp 16 | Ht 66.0 in | Wt 288.1 lb

## 2017-07-12 DIAGNOSIS — E785 Hyperlipidemia, unspecified: Secondary | ICD-10-CM | POA: Diagnosis not present

## 2017-07-12 DIAGNOSIS — B369 Superficial mycosis, unspecified: Secondary | ICD-10-CM

## 2017-07-12 DIAGNOSIS — I10 Essential (primary) hypertension: Secondary | ICD-10-CM

## 2017-07-12 NOTE — Progress Notes (Signed)
   Subjective:    Patient ID: Marcia Chan, female    DOB: 07/09/1970, 47 y.o.   MRN: [id number]  HPI HTN- chronic problem, on Coreg 6.25 BID and Triamterene HCTZ 37.5/25mg .  No CP, SOB, HAs, visual changes, edema.  Hyperlipidemia- chronic problem, on Pravastatin daily.  Denies abd pain, N/V.  Obesity- chronic problem, BMI 46.50.  Weight is unchanged.  No regular exercise.  Plans to join [employer] w/ friend.   Review of Systems For ROS see HPI     Objective:   Physical Exam  Constitutional: She is oriented to person, place, and time. She appears well-developed and well-nourished. No distress.  obese  HENT:  Head: Normocephalic and atraumatic.  Eyes: Conjunctivae and EOM are normal. Pupils are equal, round, and reactive to light.  Neck: Normal range of motion. Neck supple. No thyromegaly present.  Cardiovascular: Normal rate, regular rhythm, normal heart sounds and intact distal pulses.  No murmur heard. Pulmonary/Chest: Effort normal and breath sounds normal. No respiratory distress.  Abdominal: Soft. She exhibits no distension. There is no tenderness.  Musculoskeletal: She exhibits no edema.  Lymphadenopathy:    She has no cervical adenopathy.  Neurological: She is alert and oriented to person, place, and time.  Skin: Skin is warm and dry.  Psychiatric: She has a normal mood and affect. Her behavior is normal.  Vitals reviewed.         Assessment & Plan:

## 2017-07-12 NOTE — Patient Instructions (Signed)
Schedule your complete physical in 6 months We'll notify you of your lab results and make any changes if needed Continue to work on healthy diet and regular exercise- YOU CAN DO IT!!! We'll call you with your Dermatology appt for the foot Call with any questions or concerns Happy New Year!!!

## 2017-07-12 NOTE — Assessment & Plan Note (Signed)
Chronic problem.  Well controlled today.  Asymptomatic.  Stressed need for healthy diet and regular exercise.  Check labs.  No anticipated med changes. 

## 2017-07-12 NOTE — Assessment & Plan Note (Signed)
Ongoing issue.  Again reviewed need for healthy diet and regular exercise.  Pt plans to join [employer] with her friend.  Applauded this decision.  Check labs to risk stratify.  Will follow.

## 2017-07-12 NOTE — Assessment & Plan Note (Signed)
Chronic problem.  Tolerating statin w/o difficulty.  Check labs.  Adjust meds prn  

## 2017-07-13 ENCOUNTER — Encounter: Payer: Self-pay | Admitting: General Practice

## 2017-07-13 LAB — CBC WITH DIFFERENTIAL/PLATELET
BASOS PCT: 0.4 %
Basophils Absolute: 30 cells/uL (ref 0–200)
EOS PCT: 2 %
Eosinophils Absolute: 150 cells/uL (ref 15–500)
HCT: 37 % (ref 35.0–45.0)
Hemoglobin: 12.8 g/dL (ref 11.7–15.5)
Lymphs Abs: 2205 cells/uL (ref 850–3900)
MCH: 29.7 pg (ref 27.0–33.0)
MCHC: 34.6 g/dL (ref 32.0–36.0)
MCV: 85.8 fL (ref 80.0–100.0)
MONOS PCT: 10.9 %
MPV: 10.9 fL (ref 7.5–12.5)
Neutro Abs: 4298 cells/uL (ref 1500–7800)
Neutrophils Relative %: 57.3 %
PLATELETS: 285 10*3/uL (ref 140–400)
RBC: 4.31 10*6/uL (ref 3.80–5.10)
RDW: 11.8 % (ref 11.0–15.0)
TOTAL LYMPHOCYTE: 29.4 %
WBC: 7.5 10*3/uL (ref 3.8–10.8)
WBCMIX: 818 {cells}/uL (ref 200–950)

## 2017-07-13 LAB — HEPATIC FUNCTION PANEL
AG Ratio: 1.3 (calc) (ref 1.0–2.5)
ALKALINE PHOSPHATASE (APISO): 58 U/L (ref 33–115)
ALT: 13 U/L (ref 6–29)
AST: 16 U/L (ref 10–35)
Albumin: 4.2 g/dL (ref 3.6–5.1)
BILIRUBIN DIRECT: 0.1 mg/dL (ref 0.0–0.2)
BILIRUBIN TOTAL: 0.5 mg/dL (ref 0.2–1.2)
Globulin: 3.2 g/dL (calc) (ref 1.9–3.7)
Indirect Bilirubin: 0.4 mg/dL (calc) (ref 0.2–1.2)
Total Protein: 7.4 g/dL (ref 6.1–8.1)

## 2017-07-13 LAB — LIPID PANEL
CHOL/HDL RATIO: 4.2 (calc) (ref <5.0)
CHOLESTEROL: 161 mg/dL (ref <200)
HDL: 38 mg/dL — ABNORMAL LOW (ref >50)
LDL Cholesterol (Calc): 108 mg/dL (calc) — ABNORMAL HIGH
NON-HDL CHOLESTEROL (CALC): 123 mg/dL (ref <130)
Triglycerides: 67 mg/dL (ref <150)

## 2017-07-13 LAB — BASIC METABOLIC PANEL
BUN: 10 mg/dL (ref 7–25)
CALCIUM: 9.5 mg/dL (ref 8.6–10.2)
CO2: 28 mmol/L (ref 20–32)
Chloride: 101 mmol/L (ref 98–110)
Creat: 0.79 mg/dL (ref 0.50–1.10)
GLUCOSE: 82 mg/dL (ref 65–99)
Potassium: 3.8 mmol/L (ref 3.5–5.3)
Sodium: 138 mmol/L (ref 135–146)

## 2017-07-13 LAB — HEMOGLOBIN A1C
HEMOGLOBIN A1C: 5.7 %{Hb} — AB (ref <5.7)
MEAN PLASMA GLUCOSE: 117 (calc)
eAG (mmol/L): 6.5 (calc)

## 2017-07-13 LAB — TSH: TSH: 2.01 mIU/L

## 2017-07-19 ENCOUNTER — Other Ambulatory Visit: Payer: Self-pay | Admitting: Family Medicine

## 2017-07-30 ENCOUNTER — Other Ambulatory Visit: Payer: Self-pay | Admitting: Family Medicine

## 2017-08-07 LAB — HM MAMMOGRAPHY

## 2017-08-09 ENCOUNTER — Encounter: Payer: Self-pay | Admitting: General Practice

## 2017-10-05 ENCOUNTER — Other Ambulatory Visit: Payer: Self-pay | Admitting: Family Medicine

## 2017-10-29 ENCOUNTER — Other Ambulatory Visit: Payer: Self-pay | Admitting: Family Medicine

## 2018-01-04 ENCOUNTER — Other Ambulatory Visit: Payer: Self-pay | Admitting: Family Medicine

## 2018-01-11 ENCOUNTER — Encounter: Payer: Self-pay | Admitting: Family Medicine

## 2018-01-11 ENCOUNTER — Other Ambulatory Visit: Payer: Self-pay

## 2018-01-11 ENCOUNTER — Ambulatory Visit (INDEPENDENT_AMBULATORY_CARE_PROVIDER_SITE_OTHER): Payer: BC Managed Care – PPO | Admitting: Family Medicine

## 2018-01-11 VITALS — BP 120/80 | HR 76 | Temp 98.1°F | Resp 16 | Ht 66.0 in | Wt 287.4 lb

## 2018-01-11 DIAGNOSIS — Z0000 Encounter for general adult medical examination without abnormal findings: Secondary | ICD-10-CM | POA: Diagnosis not present

## 2018-01-11 DIAGNOSIS — E559 Vitamin D deficiency, unspecified: Secondary | ICD-10-CM | POA: Diagnosis not present

## 2018-01-11 DIAGNOSIS — K219 Gastro-esophageal reflux disease without esophagitis: Secondary | ICD-10-CM | POA: Diagnosis not present

## 2018-01-11 LAB — CBC WITH DIFFERENTIAL/PLATELET
BASOS ABS: 0 10*3/uL (ref 0.0–0.1)
BASOS PCT: 0.7 % (ref 0.0–3.0)
EOS ABS: 0.1 10*3/uL (ref 0.0–0.7)
Eosinophils Relative: 1.4 % (ref 0.0–5.0)
HEMATOCRIT: 39.1 % (ref 36.0–46.0)
HEMOGLOBIN: 13.1 g/dL (ref 12.0–15.0)
LYMPHS PCT: 22.2 % (ref 12.0–46.0)
Lymphs Abs: 1.5 10*3/uL (ref 0.7–4.0)
MCHC: 33.5 g/dL (ref 30.0–36.0)
MCV: 89.9 fl (ref 78.0–100.0)
Monocytes Absolute: 0.7 10*3/uL (ref 0.1–1.0)
Monocytes Relative: 9.5 % (ref 3.0–12.0)
Neutro Abs: 4.6 10*3/uL (ref 1.4–7.7)
Neutrophils Relative %: 66.2 % (ref 43.0–77.0)
Platelets: 250 10*3/uL (ref 150.0–400.0)
RBC: 4.35 Mil/uL (ref 3.87–5.11)
RDW: 12.9 % (ref 11.5–15.5)
WBC: 7 10*3/uL (ref 4.0–10.5)

## 2018-01-11 LAB — HEMOGLOBIN A1C: HEMOGLOBIN A1C: 5.7 % (ref 4.6–6.5)

## 2018-01-11 LAB — BASIC METABOLIC PANEL
BUN: 11 mg/dL (ref 6–23)
CHLORIDE: 100 meq/L (ref 96–112)
CO2: 33 mEq/L — ABNORMAL HIGH (ref 19–32)
Calcium: 9.3 mg/dL (ref 8.4–10.5)
Creatinine, Ser: 0.74 mg/dL (ref 0.40–1.20)
GFR: 107.76 mL/min (ref >60.00)
Glucose, Bld: 101 mg/dL — ABNORMAL HIGH (ref 70–99)
Potassium: 4.3 mEq/L (ref 3.5–5.1)
Sodium: 139 mEq/L (ref 135–145)

## 2018-01-11 LAB — LIPID PANEL
CHOL/HDL RATIO: 4
Cholesterol: 155 mg/dL (ref 0–200)
HDL: 40.3 mg/dL (ref >39.00)
LDL Cholesterol: 101 mg/dL — ABNORMAL HIGH (ref 0–99)
NONHDL: 114.79
Triglycerides: 71 mg/dL (ref 0.0–149.0)
VLDL: 14.2 mg/dL (ref 0.0–40.0)

## 2018-01-11 LAB — HEPATIC FUNCTION PANEL
ALT: 18 U/L (ref 0–35)
AST: 14 U/L (ref 0–37)
Albumin: 3.9 g/dL (ref 3.5–5.2)
Alkaline Phosphatase: 54 U/L (ref 39–117)
BILIRUBIN TOTAL: 0.5 mg/dL (ref 0.2–1.2)
Bilirubin, Direct: 0.1 mg/dL (ref 0.0–0.3)
TOTAL PROTEIN: 7 g/dL (ref 6.0–8.3)

## 2018-01-11 LAB — TSH: TSH: 2.34 u[IU]/mL (ref 0.35–4.50)

## 2018-01-11 LAB — VITAMIN D 25 HYDROXY (VIT D DEFICIENCY, FRACTURES): VITD: 12.86 ng/mL — ABNORMAL LOW (ref 30.00–100.00)

## 2018-01-11 NOTE — Assessment & Plan Note (Signed)
Ongoing issue for pt.  Again stressed need for healthy diet and regular exercise.  Check labs to risk stratify.  Will follow. 

## 2018-01-11 NOTE — Assessment & Plan Note (Signed)
Pt's PE WNL w/ exception of obesity.  UTD on pap, mammo, immunizations.  Check labs.  Anticipatory guidance provided.  

## 2018-01-11 NOTE — Progress Notes (Signed)
   Subjective:    Patient ID: Marcia Chan, female    DOB: 09/19/1970, 47 y.o.   MRN: [id number]  HPI CPE- UTD on mammo, pap, immunizations.     Review of Systems Patient reports no vision/ hearing changes, adenopathy,fever, weight change,  persistant/recurrent hoarseness , swallowing issues, chest pain, palpitations, edema, persistant/recurrent cough, hemoptysis, dyspnea (rest/exertional/paroxysmal nocturnal), gastrointestinal bleeding (melena, rectal bleeding), abdominal pain, significant heartburn, bowel changes, GU symptoms (dysuria, hematuria, incontinence), Gyn symptoms (abnormal  bleeding, pain),  syncope, focal weakness, memory loss, numbness & tingling, skin/hair/nail changes, abnormal bruising or bleeding, anxiety, or depression.     Objective:   Physical Exam General Appearance:    Alert, cooperative, no distress, appears stated age, obese  Head:    Normocephalic, without obvious abnormality, atraumatic  Eyes:    PERRL, conjunctiva/corneas clear, EOM's intact, fundi    benign, both eyes  Ears:    Normal TM's and external ear canals, both ears  Nose:   Nares normal, septum midline, mucosa normal, no drainage    or sinus tenderness  Throat:   Lips, mucosa, and tongue normal; teeth and gums normal  Neck:   Supple, symmetrical, trachea midline, no adenopathy;    Thyroid: no enlargement/tenderness/nodules  Back:     Symmetric, no curvature, ROM normal, no CVA tenderness  Lungs:     Clear to auscultation bilaterally, respirations unlabored  Chest Wall:    No tenderness or deformity   Heart:    Regular rate and rhythm, S1 and S2 normal, no murmur, rub   or gallop  Breast Exam:    Deferred to GYN  Abdomen:     Soft, non-tender, bowel sounds active all four quadrants,    no masses, no organomegaly  Genitalia:    Deferred to GYN  Rectal:    Extremities:   Extremities normal, atraumatic, no cyanosis or edema  Pulses:   2+ and symmetric all extremities  Skin:   Skin color,  texture, turgor normal, no rashes or lesions  Lymph nodes:   Cervical, supraclavicular, and axillary nodes normal  Neurologic:   CNII-XII intact, normal strength, sensation and reflexes    throughout          Assessment & Plan:

## 2018-01-11 NOTE — Patient Instructions (Addendum)
Follow up in 6 months to recheck BP and cholesterol We'll notify you of your lab results and make any changes if needed Continue to work on healthy diet and regular exercise- you can do it!!! We'll call you with your GI appt to assess the reflux Call with any questions or concerns Have a great summer!!

## 2018-01-11 NOTE — Assessment & Plan Note (Signed)
Check labs and replete prn. 

## 2018-01-14 ENCOUNTER — Other Ambulatory Visit: Payer: Self-pay | Admitting: General Practice

## 2018-01-14 MED ORDER — VITAMIN D (ERGOCALCIFEROL) 1.25 MG (50000 UNIT) PO CAPS: 50000.0000 [IU] | ORAL_CAPSULE | ORAL | 0 refills | Status: DC

## 2018-01-14 MED ORDER — PRAVASTATIN SODIUM 40 MG PO TABS: ORAL_TABLET | ORAL | 1 refills | Status: DC

## 2018-01-14 MED ORDER — PANTOPRAZOLE SODIUM 40 MG PO TBEC: DELAYED_RELEASE_TABLET | ORAL | 1 refills | Status: DC

## 2018-01-14 MED ORDER — CLOTRIMAZOLE-BETAMETHASONE 1-0.05 % EX CREA: TOPICAL_CREAM | CUTANEOUS | 0 refills | Status: DC

## 2018-01-28 ENCOUNTER — Other Ambulatory Visit: Payer: Self-pay | Admitting: Family Medicine

## 2018-02-15 ENCOUNTER — Ambulatory Visit: Payer: Self-pay | Admitting: Family Medicine

## 2018-02-15 ENCOUNTER — Ambulatory Visit: Payer: BC Managed Care – PPO | Admitting: Family Medicine

## 2018-02-15 ENCOUNTER — Encounter: Payer: Self-pay | Admitting: Family Medicine

## 2018-02-15 ENCOUNTER — Other Ambulatory Visit: Payer: Self-pay

## 2018-02-15 VITALS — BP 116/80 | HR 74 | Temp 98.7°F | Resp 16 | Ht 66.0 in | Wt 285.8 lb

## 2018-02-15 DIAGNOSIS — H8110 Benign paroxysmal vertigo, unspecified ear: Secondary | ICD-10-CM | POA: Diagnosis not present

## 2018-02-15 MED ORDER — MECLIZINE HCL 25 MG PO TABS: 25.0000 mg | ORAL_TABLET | Freq: Three times a day (TID) | ORAL | 0 refills | Status: DC | PRN

## 2018-02-15 NOTE — Telephone Encounter (Signed)
FYI

## 2018-02-15 NOTE — Progress Notes (Signed)
Subjective  CC:  Chief Complaint  Patient presents with  . Dizziness    Started this morning    HPI: Marcia Chan is a 47 y.o. female who presents to the office today to address the problems listed above in the chief complaint.  I reviewed recent nurse triage note for vertigo.  47 year old patient presents with acute onset of positional vertigo this a.m.  Has classic symptoms: Peripheral external spinning with head movement.  Associated with nausea without vomiting.  Denies paresis dysarthria or diplopia.  Feels better if sits completely still with eyes closed.  Had similar instance about 4 years ago evaluated and treated in the ER and ENT.  She did well with meclizine.  Reports recent mild URI symptoms over the last 2 days.  No fevers. Assessment  1. Benign paroxysmal positional vertigo, unspecified laterality      Plan   Positional vertigo: Education and counseling given in detail.  See after visit summary for educational handout.  Start meclizine as needed.  Discussed safety measures to avoid injury, falls.  Discussed red flag symptoms.  None today.  Follow-up in 2 weeks if not improved.  Follow up: Return if symptoms worsen or fail to improve.   No orders of the defined types were placed in this encounter.  Meds ordered this encounter  Medications  . meclizine (ANTIVERT) 25 MG tablet    Sig: Take 1 tablet (25 mg total) by mouth 3 (three) times daily as needed for dizziness.    Dispense:  30 tablet    Refill:  0      I reviewed the patients updated PMH, FH, and SocHx.    Patient Active Problem List   Diagnosis Date Noted  . Vitamin D deficiency 12/14/2016  . Plantar fasciitis of left foot 01/06/2014  . Numbness and tingling of left leg 06/09/2013  . Lumbar back pain 12/23/2012  . Lateral epicondylitis 12/23/2012  . GERD (gastroesophageal reflux disease) 05/03/2012  . Seasonal allergic rhinitis 10/08/2011  . General medical examination 10/06/2011  . Cough  06/21/2011  . Hair loss 02/21/2011  . HTN (hypertension) 02/10/2011  . Hyperlipidemia 02/10/2011  . Severe obesity (BMI >= 40) (HCC) 02/10/2011  . Nail fungus 02/10/2011   Current Meds  Medication Sig  . carvedilol (COREG) 6.25 MG tablet TAKE 1 TABLET BY MOUTH TWICE DAILY WITH A MEAL  . clotrimazole-betamethasone (LOTRISONE) cream APPLY EXTERNALLY TO THE AFFECTED AREA TWICE DAILY  . levonorgestrel (MIRENA) 20 MCG/24HR IUD 1 each by Intrauterine route once.  . pantoprazole (PROTONIX) 40 MG tablet TAKE 1 TABLET(40 MG) BY MOUTH DAILY  . pravastatin (PRAVACHOL) 40 MG tablet TAKE 1 TABLET BY MOUTH DAILY.  Marland Kitchen. triamterene-hydrochlorothiazide (MAXZIDE-25) 37.5-25 MG tablet TAKE 1 TABLET BY MOUTH DAILY  . Vitamin D, Ergocalciferol, (DRISDOL) 50000 units CAPS capsule Take 1 capsule (50,000 Units total) by mouth every 7 (seven) days.    Allergies: Patient is allergic to bextra [valdecoxib]. Family History: Patient family history includes Arthritis in her mother; Diabetes in her mother; Heart disease in her mother; Hypertension in her mother. Social History:  Patient  reports that she has never smoked. She has never used smokeless tobacco. She reports that she does not drink alcohol or use drugs.  Review of Systems: Constitutional: Negative for fever malaise or anorexia Cardiovascular: negative for chest pain Respiratory: negative for SOB or persistent cough Gastrointestinal: negative for abdominal pain  Objective  Vitals: BP 116/80   Pulse 74   Temp 98.7 F (37.1 C) (  Oral)   Resp 16   Ht 5\' 6"  (1.676 m)   Wt 285 lb 12.8 oz (129.6 kg)   SpO2 98%   BMI 46.13 kg/m  General: Sitting still in chair with eyes closed.  Ambulates slowly, A&Ox3 HEENT: PEERL, conjunctiva normal, Oropharynx moist,neck is supple, no nystagmus Cardiovascular:  RRR without murmur or gallop.  Respiratory:  Good breath sounds bilaterally, CTAB with normal respiratory effort Skin:  Warm, no rashes Neuro: Cranial  nerves II through XII intact.  He has dizziness with turning her head.  Hallpike maneuver was not done.  Normal use of her extremities.     Commons side effects, risks, benefits, and alternatives for medications and treatment plan prescribed today were discussed, and the patient expressed understanding of the given instructions. Patient is instructed to call or message via MyChart if he/she has any questions or concerns regarding our treatment plan. No barriers to understanding were identified. We discussed Red Flag symptoms and signs in detail. Patient expressed understanding regarding what to do in case of urgent or emergency type symptoms.   Medication list was reconciled, printed and provided to the patient in AVS. Patient instructions and summary information was reviewed with the patient as documented in the AVS. This note was prepared with assistance of Dragon voice recognition software. Occasional wrong-word or sound-a-like substitutions may have occurred due to the inherent limitations of voice recognition software

## 2018-02-15 NOTE — Telephone Encounter (Signed)
Patient is calling to reports that she awoke this morning with vertigo - she does have a distant history of vertigo 3-4 years ago. Appointment made for evaluation  Reason for Disposition . [1] MODERATE dizziness (e.g., vertigo; feels very unsteady, interferes with normal activities) AND [2] has been evaluated by physician for this  Answer Assessment - Initial Assessment Questions 1. DESCRIPTION: "Describe your dizziness."     Patient states when she gets up everything moves and shifts 2. VERTIGO: "Do you feel like either you or the room is spinning or tilting?"      Out of focus- rotates and shifts- spinning 3. LIGHTHEADED: "Do you feel lightheaded?" (e.g., somewhat faint, woozy, weak upon standing)     yes 4. SEVERITY: "How bad is it?"  "Can you walk?"   - MILD - Feels unsteady but walking normally.   - MODERATE - Feels very unsteady when walking, but not falling; interferes with normal activities (e.g., school, work) .   - SEVERE - Unable to walk without falling (requires assistance).     moderate 5. ONSET:  "When did the dizziness begin?"     This morning 6. AGGRAVATING FACTORS: "Does anything make it worse?" (e.g., standing, change in head position)     changing positions 7. CAUSE: "What do you think is causing the dizziness?"     Vertigo hx 8. RECURRENT SYMPTOM: "Have you had dizziness before?" If so, ask: "When was the last time?" "What happened that time?"     History- 4 years ago- meclizine  9. OTHER SYMPTOMS: "Do you have any other symptoms?" (e.g., headache, weakness, numbness, vomiting, earache)     Headache yesterday 10. PREGNANCY: "Is there any chance you are pregnant?" "When was your last menstrual period?"       IUD  Protocols used: DIZZINESS - VERTIGO-A-AH

## 2018-02-15 NOTE — Patient Instructions (Signed)
Please return in 2 weeks if needed for recheck.  If you have any questions or concerns, please don't hesitate to send me a message via MyChart or call the office at [phone]. Thank you for visiting with Marcia Chan today! It's our pleasure caring for you.   Benign Positional Vertigo Vertigo is the feeling that you or your surroundings are moving when they are not. Benign positional vertigo is the most common form of vertigo. The cause of this condition is not serious (is benign). This condition is triggered by certain movements and positions (is positional). This condition can be dangerous if it occurs while you are doing something that could endanger you or others, such as driving. What are the causes? In many cases, the cause of this condition is not known. It may be caused by a disturbance in an area of the inner ear that helps your brain to sense movement and balance. This disturbance can be caused by a viral infection (labyrinthitis), head injury, or repetitive motion. What increases the risk? This condition is more likely to develop in:  Women.  People who are 48 years of age or older.  What are the signs or symptoms? Symptoms of this condition usually happen when you move your head or your eyes in different directions. Symptoms may start suddenly, and they usually last for less than a minute. Symptoms may include:  Loss of balance and falling.  Feeling like you are spinning or moving.  Feeling like your surroundings are spinning or moving.  Nausea and vomiting.  Blurred vision.  Dizziness.  Involuntary eye movement (nystagmus).  Symptoms can be mild and cause only slight annoyance, or they can be severe and interfere with daily life. Episodes of benign positional vertigo may return (recur) over time, and they may be triggered by certain movements. Symptoms may improve over time. How is this diagnosed? This condition is usually diagnosed by medical history and a physical exam of  the head, neck, and ears. You may be referred to a health care provider who specializes in ear, nose, and throat (ENT) problems (otolaryngologist) or a provider who specializes in disorders of the nervous system (neurologist). You may have additional testing, including:  MRI.  A CT scan.  Eye movement tests. Your health care provider may ask you to change positions quickly while he or she watches you for symptoms of benign positional vertigo, such as nystagmus. Eye movement may be tested with an electronystagmogram (ENG), caloric stimulation, the Dix-Hallpike test, or the roll test.  An electroencephalogram (EEG). This records electrical activity in your brain.  Hearing tests.  How is this treated? Usually, your health care provider will treat this by moving your head in specific positions to adjust your inner ear back to normal. Surgery may be needed in severe cases, but this is rare. In some cases, benign positional vertigo may resolve on its own in 2-4 weeks. Follow these instructions at home: Safety  Move slowly.Avoid sudden body or head movements.  Avoid driving.  Avoid operating heavy machinery.  Avoid doing any tasks that would be dangerous to you or others if a vertigo episode would occur.  If you have trouble walking or keeping your balance, try using a cane for stability. If you feel dizzy or unstable, sit down right away.  Return to your normal activities as told by your health care provider. Ask your health care provider what activities are safe for you. General instructions  Take over-the-counter and prescription medicines only as told by  your health care provider.  Avoid certain positions or movements as told by your health care provider.  Drink enough fluid to keep your urine clear or pale yellow.  Keep all follow-up visits as told by your health care provider. This is important. Contact a health care provider if:  You have a fever.  Your condition gets worse  or you develop new symptoms.  Your family or friends notice any behavioral changes.  Your nausea or vomiting gets worse.  You have numbness or a "pins and needles" sensation. Get help right away if:  You have difficulty speaking or moving.  You are always dizzy.  You faint.  You develop severe headaches.  You have weakness in your legs or arms.  You have changes in your hearing or vision.  You develop a stiff neck.  You develop sensitivity to light. This information is not intended to replace advice given to you by your health care provider. Make sure you discuss any questions you have with your health care provider. Document Released: 03/27/2006 Document Revised: 11/25/2015 Document Reviewed: 10/12/2014 Elsevier Interactive Patient Education  [employer]2018 [employer]

## 2018-03-28 ENCOUNTER — Other Ambulatory Visit: Payer: Self-pay

## 2018-03-28 ENCOUNTER — Other Ambulatory Visit: Payer: Self-pay | Admitting: Family Medicine

## 2018-03-28 ENCOUNTER — Ambulatory Visit (INDEPENDENT_AMBULATORY_CARE_PROVIDER_SITE_OTHER): Payer: BC Managed Care – PPO | Admitting: Family Medicine

## 2018-03-28 ENCOUNTER — Encounter: Payer: Self-pay | Admitting: Family Medicine

## 2018-03-28 VITALS — BP 124/81 | HR 79 | Temp 98.9°F | Resp 16 | Ht 66.0 in | Wt 288.0 lb

## 2018-03-28 DIAGNOSIS — R42 Dizziness and giddiness: Secondary | ICD-10-CM | POA: Diagnosis not present

## 2018-03-28 DIAGNOSIS — J0110 Acute frontal sinusitis, unspecified: Secondary | ICD-10-CM | POA: Diagnosis not present

## 2018-03-28 MED ORDER — PREDNISONE 10 MG PO TABS: ORAL_TABLET | ORAL | 0 refills | Status: DC

## 2018-03-28 MED ORDER — FLUTICASONE PROPIONATE 50 MCG/ACT NA SUSP: 2.0000 | Freq: Every day | NASAL | 6 refills | Status: DC

## 2018-03-28 NOTE — Progress Notes (Signed)
   Subjective:    Patient ID: Marcia Chan, female    DOB: [birth year], 47 y.o.   MRN: [id number]  HPI Vertigo- sxs started mid August when she woke.  She has hx of similar but the last episode was 'years' ago.  This time the meclizine is not helping and the episode continues over a month later.  Is also having daily frontal headaches.  Will have dizziness w/ turning her head, leaning forward, lying down at night.  sxs are worse on L than R.  Has not been doing any at home exercises.  Denies sinus pain/pressure.  No allergy congestion or PND.   Review of Systems For ROS see HPI     Objective:   Physical Exam  Constitutional: She is oriented to person, place, and time. She appears well-developed and well-nourished. No distress.  HENT:  Head: Normocephalic and atraumatic.  Right Ear: Tympanic membrane is retracted.  Left Ear: Tympanic membrane is retracted.  Nose: Mucosal edema and rhinorrhea present. Right sinus exhibits no maxillary sinus tenderness and no frontal sinus tenderness. Left sinus exhibits no maxillary sinus tenderness and no frontal sinus tenderness.  Mouth/Throat: Mucous membranes are normal. Posterior oropharyngeal erythema (w/ PND) present.  + TTP over frontal sinuses (-) TTP over maxillary sinuses  Eyes: Pupils are equal, round, and reactive to light. Conjunctivae and EOM are normal.  Neck: Normal range of motion. Neck supple.  Cardiovascular: Normal rate, regular rhythm and normal heart sounds.  Pulmonary/Chest: Effort normal and breath sounds normal. No respiratory distress. She has no wheezes. She has no rales.  Lymphadenopathy:    She has no cervical adenopathy.  Neurological: She is alert and oriented to person, place, and time. No cranial nerve deficit. She exhibits normal muscle tone. Coordination normal.  Skin: Skin is warm and dry.  Psychiatric: She has a normal mood and affect. Her behavior is normal. Thought content normal.  Vitals reviewed.        Assessment & Plan:  Vertigo- ongoing issue for pt.  Continue Meclizine PRN.  Start prednisone taper.  Showed pt modified Epley Maneuver.  Suspect this is due to sinus congestion.  Reviewed supportive care and red flags that should prompt return.  Pt expressed understanding and is in agreement w/ plan.   Sinusitis- new.  Pt's sxs are consistent w/ sinus inflammation rather than infxn.  Start prednisone taper and flonase.  Reviewed supportive care and red flags that should prompt return.  Pt expressed understanding and is in agreement w/ plan.

## 2018-03-28 NOTE — Patient Instructions (Signed)
Follow up as needed or as scheduled START the Prednisone as directed- take w/ food Don't take any other anti-inflammatories (ibuprofen, motrin, advil, aleve) while on the Prednisone but you can add Tylenol (Acetaminophen) as needed START the Flonase- 2 sprays each nostril daily Drink plenty of fluids Do the exercises as shown to improve your vertigo Call with any questions or concerns Hang in there!!!

## 2018-04-10 ENCOUNTER — Ambulatory Visit (INDEPENDENT_AMBULATORY_CARE_PROVIDER_SITE_OTHER): Payer: BC Managed Care – PPO | Admitting: General Practice

## 2018-04-10 DIAGNOSIS — Z23 Encounter for immunization: Secondary | ICD-10-CM | POA: Diagnosis not present

## 2018-04-10 NOTE — Progress Notes (Signed)
Marcia Chan is a 47 y.o. female presents to the office today for influenza injections, per physician's orders. Original order: 04/10/18 Fluarix (med), 0.70mL (dose),  IM(route) was administered LD (location) today. Patient tolerated injection.  Haiden Clucas L Azzie Thiem

## 2018-04-29 ENCOUNTER — Other Ambulatory Visit: Payer: Self-pay | Admitting: Family Medicine

## 2018-06-26 ENCOUNTER — Other Ambulatory Visit: Payer: Self-pay | Admitting: Family Medicine

## 2018-07-01 ENCOUNTER — Other Ambulatory Visit: Payer: Self-pay

## 2018-07-01 ENCOUNTER — Other Ambulatory Visit: Payer: Self-pay | Admitting: Family Medicine

## 2018-07-01 ENCOUNTER — Ambulatory Visit (INDEPENDENT_AMBULATORY_CARE_PROVIDER_SITE_OTHER): Payer: BC Managed Care – PPO | Admitting: Family Medicine

## 2018-07-01 ENCOUNTER — Encounter: Payer: Self-pay | Admitting: Family Medicine

## 2018-07-01 VITALS — BP 126/83 | HR 78 | Temp 98.5°F | Resp 16 | Ht 66.0 in | Wt 288.5 lb

## 2018-07-01 DIAGNOSIS — M79604 Pain in right leg: Secondary | ICD-10-CM | POA: Diagnosis not present

## 2018-07-01 DIAGNOSIS — M2550 Pain in unspecified joint: Secondary | ICD-10-CM | POA: Diagnosis not present

## 2018-07-01 DIAGNOSIS — G2581 Restless legs syndrome: Secondary | ICD-10-CM | POA: Diagnosis not present

## 2018-07-01 LAB — CK: Total CK: 88 U/L (ref 7–177)

## 2018-07-01 LAB — SEDIMENTATION RATE: SED RATE: 20 mm/h (ref 0–20)

## 2018-07-01 MED ORDER — ROPINIROLE HCL 0.5 MG PO TABS: 0.5000 mg | ORAL_TABLET | Freq: Every day | ORAL | 3 refills | Status: DC

## 2018-07-01 NOTE — Patient Instructions (Signed)
Follow up in 3-4 weeks to recheck leg pains We'll notify you of your lab results and make any changes if needed START the Ropinirole (Requip) nightly.  Start w/ 1/2 tab nightly and increase to whole tab after 4 days (if needed) Continue to work on healthy diet and regular exercise- this will improve joint pain Heating pad as needed for pain relief Call with any questions or concerns Hang in there!

## 2018-07-01 NOTE — Progress Notes (Signed)
   Subjective:    Patient ID: Marcia Chan, female    DOB: 08/31/1970, 47 y.o.   MRN: [id number]  HPI Joint pain- bilateral leg pain, R>L.  Unable to sleep at night.  Not painful during the day or when walking.  Pain occurs from R knee down to foot- described as a crushing pain.  Forces pt to get up and sit in recliner at night- this relieves pain.  No relief w/ NSAIDs or tylenol.  Pt reports ongoing muscle and joint pain.  Pt reports intermittent LBP.     Review of Systems For ROS see HPI     Objective:   Physical Exam Vitals signs reviewed.  Constitutional:      General: She is not in acute distress.    Appearance: She is obese.  Cardiovascular:     Pulses: Normal pulses.  Musculoskeletal:        General: No swelling, tenderness (no TTP over lower legs bilaterally) or deformity.  Skin:    General: Skin is warm and dry.     Findings: No rash.  Neurological:     General: No focal deficit present.     Mental Status: She is alert and oriented to person, place, and time.     Cranial Nerves: No cranial nerve deficit.     Sensory: No sensory deficit.     Motor: No weakness.     Coordination: Coordination normal.     Gait: Gait normal.     Deep Tendon Reflexes: Reflexes normal.     Comments: (-) SLR bilaterally  Psychiatric:        Mood and Affect: Mood normal.        Behavior: Behavior normal.        Thought Content: Thought content normal.        Judgment: Judgment normal.           Assessment & Plan:  RLS- new.  Pt's inability to lie down at night w/o pain that resolves w/ getting up and lying in recliner is consistent w/ possible RLS variant.  Discussed possibility of lumbar radicular back pain causing leg pain but pt denies LBP.  Start low dose requip and monitor for improvement.  R lower leg pain- see above.  I feel this may be some sort of RLS variant.  If no improvement w/ Requip, we can try Lyrica while pursuing an additional workup.  No pain or swelling in  office today to suggest DVT or other acute cause.  Polyarthralgia- pt reports ongoing myalgias and arthralgias.  This has been worsening over the last 6 months.  Discussed lifestyle modifications that could improve pain.  Will also check labs to r/o rheumatologic cause.  Reviewed supportive care and red flags that should prompt return.  Pt expressed understanding and is in agreement w/ plan.

## 2018-07-02 LAB — ANTI-NUCLEAR AB-TITER (ANA TITER): ANA Titer 1: 1:80 {titer} — ABNORMAL HIGH

## 2018-07-02 LAB — RHEUMATOID FACTOR: Rheumatoid fact SerPl-aCnc: <14 IU/mL (ref <14)

## 2018-07-02 LAB — ANA: Anti Nuclear Antibody(ANA): POSITIVE — AB

## 2018-07-02 MED ORDER — MECLIZINE HCL 25 MG PO TABS: 25.0000 mg | ORAL_TABLET | Freq: Three times a day (TID) | ORAL | 0 refills | Status: DC | PRN

## 2018-07-04 ENCOUNTER — Other Ambulatory Visit: Payer: Self-pay | Admitting: Family Medicine

## 2018-07-04 DIAGNOSIS — R768 Other specified abnormal immunological findings in serum: Secondary | ICD-10-CM

## 2018-07-24 ENCOUNTER — Ambulatory Visit: Payer: BC Managed Care – PPO | Admitting: Family Medicine

## 2018-07-26 ENCOUNTER — Other Ambulatory Visit: Payer: Self-pay | Admitting: Family Medicine

## 2018-07-29 NOTE — Progress Notes (Signed)
 Office Visit Note  Patient: Marcia Chan             Date of Birth: 03/26/1971           MRN: [id number]             PCP: Tabori, Katherine E, MD Referring: Tabori, Katherine E, MD Visit Date: 08/12/2018 Occupation: teacher - Pre K  Subjective:  Generalized pain (Abnormal labs)   History of Present Illness: Marcia Chan is a 47 y.o. female seen in consultation per request of her PCP.  According to patient her symptoms are started about 1 year ago with generalized body ache.  She states at that time her primary care physician diagnosed her with vitamin D deficiency and the treatment helped her symptoms.  Soon the symptoms recurred again and treated with vitamin D.  She states in August 2019 her symptoms became more severe with generalized pain and discomfort.  She states she had lower extremity pain especially in her calves and it was waking her up.  Gradually the pain moved to the other parts of the body she states she had pain in her ankles, hips, arms and generalized pain.  In December 2019 she went for a physical at the time she was taking Motrin to control her symptoms.  She was having difficulty walking and waking up at night.  She was given a prescription of Requip but she did not take it.  She states she was told she had a positive ANA.  She states the pain is generalized all over her body now.  She also had some joint pain which she describes in her cervical spine, her elbows, her knees and her ankles.  She has not seen any joint swelling.  No history of any rash.  Activities of Daily Living:  Patient reports morning stiffness for 3 hours.   Patient Reports nocturnal pain.  Difficulty dressing/grooming: Denies Difficulty climbing stairs: Denies Difficulty getting out of chair: Denies Difficulty using hands for taps, buttons, cutlery, and/or writing: Denies  Review of Systems  Constitutional: Negative for fatigue, night sweats, weight gain and weight loss.  HENT:  Negative for mouth sores, trouble swallowing, trouble swallowing, mouth dryness and nose dryness.   Eyes: Negative for pain, redness, visual disturbance and dryness.  Respiratory: Negative for cough, shortness of breath and difficulty breathing.   Cardiovascular: Negative for chest pain, palpitations, hypertension, irregular heartbeat and swelling in legs/feet.  Gastrointestinal: Positive for constipation. Negative for blood in stool and diarrhea.  Endocrine: Negative for increased urination.  Genitourinary: Negative for difficulty urinating and vaginal dryness.  Musculoskeletal: Positive for arthralgias, joint pain, myalgias, morning stiffness and myalgias. Negative for joint swelling, muscle weakness and muscle tenderness.  Skin: Positive for hair loss. Negative for color change, rash, skin tightness, ulcers and sensitivity to sunlight.  Allergic/Immunologic: Negative for susceptible to infections.  Neurological: Negative for dizziness, memory loss, night sweats and weakness.  Hematological: Negative for bruising/bleeding tendency and swollen glands.  Psychiatric/Behavioral: Positive for sleep disturbance. Negative for depressed mood. The patient is not nervous/anxious.     PMFS History:  Patient Active Problem List   Diagnosis Date Noted  . Vitamin D deficiency 12/14/2016  . Plantar fasciitis of left foot 01/06/2014  . Numbness and tingling of left leg 06/09/2013  . Lumbar back pain 12/23/2012  . Lateral epicondylitis 12/23/2012  . GERD (gastroesophageal reflux disease) 05/03/2012  . Seasonal allergic rhinitis 10/08/2011  . General medical examination 10/06/2011  . Cough 06/21/2011  .   Hair loss 02/21/2011  . HTN (hypertension) 02/10/2011  . Hyperlipidemia 02/10/2011  . Severe obesity (BMI >= 40) ([place]) 02/10/2011  . Nail fungus 02/10/2011    Past Medical History:  Diagnosis Date  . GERD (gastroesophageal reflux disease)   . Hyperlipidemia   . Hypertension   . Vertigo       Family History  Problem Relation Age of Onset  . Arthritis Mother        OA/DJD  . Heart disease Mother   . Diabetes Mother   . Hypertension Mother   . Cancer Father   . Hypertension Father    History reviewed. No pertinent surgical history. Social History   Social History Narrative   G3 P1 A2   Immunization History  Administered Date(s) Administered  . Influenza Split 05/03/2012  . Influenza,inj,Quad PF,6+ Mos 05/06/2013, 04/17/2014, 05/07/2015, 04/17/2016, 04/12/2017, 04/10/2018  . Tdap 04/17/2014     Objective: Vital Signs: BP 120/77 (BP Location: Right Arm, Patient Position: Sitting, Cuff Size: Large)   Pulse 73   Resp 16   Ht 5' 7.25" (1.708 m)   Wt 296 lb 6.4 oz (134.4 kg)   BMI 46.08 kg/m    Physical Exam Vitals signs and nursing note reviewed.  Constitutional:      Appearance: She is well-developed.  HENT:     Head: Normocephalic and atraumatic.  Eyes:     Conjunctiva/sclera: Conjunctivae normal.  Neck:     Musculoskeletal: Normal range of motion.  Cardiovascular:     Rate and Rhythm: Normal rate and regular rhythm.     Heart sounds: Normal heart sounds.  Pulmonary:     Effort: Pulmonary effort is normal.     Breath sounds: Normal breath sounds.  Abdominal:     General: Bowel sounds are normal.     Palpations: Abdomen is soft.  Lymphadenopathy:     Cervical: No cervical adenopathy.  Skin:    General: Skin is warm and dry.     Capillary Refill: Capillary refill takes less than 2 seconds.  Neurological:     Mental Status: She is alert and oriented to person, place, and time.  Psychiatric:        Behavior: Behavior normal.      Musculoskeletal Exam: C-spine good range of motion.  She has difficulty bending forward due to tightness in her calf and hamstring muscles.  Shoulder joints, elbow joints, wrist joints, MCPs, PIPs and DIPs with good range of motion with no synovitis.  Hip joints, knee joints, ankles, MTPs PIPs with good range of motion.   She has discomfort range of motion of bilateral knee joints.  She has tenderness over bilateral ankle joints without any warmth swelling or effusion.  She has generalized hyperalgesia.  CDAI Exam: CDAI Score: Not documented Patient Global Assessment: Not documented; Provider Global Assessment: Not documented Swollen: Not documented; Tender: Not documented Joint Exam   Not documented   There is currently no information documented on the homunculus. Go to the Rheumatology activity and complete the homunculus joint exam.  Investigation: Findings:  07/01/18: ANA 1:80 NS, sed rate 20, CK 88, RF<14  Component     Latest Ref Rng & Units 07/01/2018  ANA Titer 1     titer 1:80 (H)  ANA Pattern 1      Nuclear, Speckled (A)  Anti Nuclear Antibody(ANA)     NEGATIVE POSITIVE (A)  Sed Rate     0 - 20 mm/hr 20  CK Total     7 -  177 U/L 88  RA Latex Turbid.     <14 IU/mL <14   Imaging: Xr Ankle 2 Views Left  Result Date: 08/12/2018 No tibiotalar or subtalar joint space narrowing was noted.  Inferior calcaneal spur was noted.  Xr Ankle 2 Views Right  Result Date: 08/12/2018 No tibiotalar or subtalar joint space narrowing was noted.  Inferior and posterior calcaneal spurs were noted.  Xr Hand 2 View Left  Result Date: 08/12/2018 No MCP, PIP or DIP narrowing was noted.  No intercarpal radiocarpal joint space narrowing was noted.  No erosive changes were noted. Impression: Unremarkable x-ray of the hand.  Xr Hand 2 View Right  Result Date: 08/12/2018 No MCP, PIP or DIP narrowing was noted.  No intercarpal radiocarpal joint space narrowing was noted.  No erosive changes were noted. Impression: Unremarkable x-ray of the hand.  Xr Knee 3 View Left  Result Date: 08/12/2018 Moderate lateral compartment narrowing was noted.  No chondrocalcinosis was noted.  No patellofemoral narrowing was noted. Impression: These findings are consistent with moderate osteoarthritis of the knee joint.  Xr  Knee 3 View Right  Result Date: 08/12/2018 Mild lateral compartment narrowing was noted.  Mild patellofemoral narrowing was noted.  No chondrocalcinosis was noted. Impression: These findings are consistent with mild osteoarthritis and mild chondromalacia patella.   Recent Labs: Lab Results  Component Value Date   WBC 7.0 01/11/2018   HGB 13.1 01/11/2018   PLT 250.0 01/11/2018   NA 139 01/11/2018   K 4.3 01/11/2018   CL 100 01/11/2018   CO2 33 (H) 01/11/2018   GLUCOSE 101 (H) 01/11/2018   BUN 11 01/11/2018   CREATININE 0.74 01/11/2018   BILITOT 0.5 01/11/2018   ALKPHOS 54 01/11/2018   AST 14 01/11/2018   ALT 18 01/11/2018   PROT 7.0 01/11/2018   ALBUMIN 3.9 01/11/2018   CALCIUM 9.3 01/11/2018    Speciality Comments: No specialty comments available.  Procedures:  No procedures performed Allergies: Bextra [valdecoxib]   Assessment / Plan:     Visit Diagnoses: Positive ANA (antinuclear antibody) - 07/01/18: ANA 1:80 NS, sed rate 20, CK 88, RF<14 -patient has low titer positive ANA.  Besides arthralgias and myalgias she does not have any other clinical features of autoimmune disease.  I will obtain additional labs to complete the work-up.  Plan: CBC with Differential/Platelet, COMPLETE METABOLIC PANEL WITH GFR, Anti-scleroderma antibody, RNP Antibody, Anti-Smith antibody, Sjogrens syndrome-A extractable nuclear antibody, Sjogrens syndrome-B extractable nuclear antibody, Anti-DNA antibody, double-stranded, C3 and C4  Hair loss - Plan: TSH  Pain in both hands -no synovitis was noted.  Plan: XR Hand 2 View Right, XR Hand 2 View Left.  Bilateral hand x-rays were within normal limits.  Chronic pain of both knees -no warmth swelling or effusion was noted.  Plan: XR KNEE 3 VIEW RIGHT, XR KNEE 3 VIEW LEFT, x-ray showed mild to moderate osteoarthritis and mild chondromalacia patella.  No chondrocalcinosis was noted.  Uric acid, Cyclic citrul peptide antibody, IgG, 14-3-3 eta Protein,  Angiotensin converting enzyme, HLA-B27 antigen, Aldolase  Chronic pain of both ankles -she has tenderness on palpation of bilateral ankle joints without any synovitis.  Plan: XR Ankle 2 Views Right, XR Ankle 2 Views Left.  Ankle joint x-rays were unremarkable except for calcaneal spurs.  Plantar fasciitis of left foot-patient gives history of plantar fasciitis in the past.  Insomnia-patient gives history of insomnia due to nocturnal pain and muscle spasms.  She was diagnosed with restless leg syndrome but she  has not a started Requip.  I have advised her to start on Requip.  Other medical problems are listed as follows:  Nail fungus  Essential hypertension-her blood pressure is well controlled.  Vitamin D deficiency-patient states she has been treated for vitamin D deficiency twice.  History of hyperlipidemia  History of gastroesophageal reflux (GERD)   Orders: Orders Placed This Encounter  Procedures  . XR KNEE 3 VIEW RIGHT  . XR KNEE 3 VIEW LEFT  . XR Ankle 2 Views Right  . XR Ankle 2 Views Left  . XR Hand 2 View Right  . XR Hand 2 View Left  . CBC with Differential/Platelet  . COMPLETE METABOLIC PANEL WITH GFR  . TSH  . Uric acid  . Cyclic citrul peptide antibody, IgG  . 14-3-3 eta Protein  . Anti-scleroderma antibody  . RNP Antibody  . Anti-Smith antibody  . Sjogrens syndrome-A extractable nuclear antibody  . Sjogrens syndrome-B extractable nuclear antibody  . Anti-DNA antibody, double-stranded  . Angiotensin converting enzyme  . HLA-B27 antigen  . Aldolase  . C3 and C4   No orders of the defined types were placed in this encounter.   Face-to-face time spent with patient was 50 minutes. Greater than 50% of time was spent in counseling and coordination of care.  Follow-Up Instructions: Return for +ANA, Generalized pain.   Shaili Deveshwar, MD  Note - This record has been created using Dragon software.  Chart creation errors have been sought, but may not  always  have been located. Such creation errors do not reflect on  the standard of medical care.  

## 2018-08-08 LAB — HM MAMMOGRAPHY

## 2018-08-12 ENCOUNTER — Ambulatory Visit (INDEPENDENT_AMBULATORY_CARE_PROVIDER_SITE_OTHER): Payer: Self-pay

## 2018-08-12 ENCOUNTER — Ambulatory Visit: Payer: BC Managed Care – PPO | Admitting: Rheumatology

## 2018-08-12 ENCOUNTER — Encounter: Payer: Self-pay | Admitting: Rheumatology

## 2018-08-12 VITALS — BP 120/77 | HR 73 | Resp 16 | Ht 67.25 in | Wt 296.4 lb

## 2018-08-12 DIAGNOSIS — M722 Plantar fascial fibromatosis: Secondary | ICD-10-CM

## 2018-08-12 DIAGNOSIS — E559 Vitamin D deficiency, unspecified: Secondary | ICD-10-CM

## 2018-08-12 DIAGNOSIS — Z8719 Personal history of other diseases of the digestive system: Secondary | ICD-10-CM

## 2018-08-12 DIAGNOSIS — M79641 Pain in right hand: Secondary | ICD-10-CM

## 2018-08-12 DIAGNOSIS — M79642 Pain in left hand: Secondary | ICD-10-CM

## 2018-08-12 DIAGNOSIS — L659 Nonscarring hair loss, unspecified: Secondary | ICD-10-CM | POA: Diagnosis not present

## 2018-08-12 DIAGNOSIS — M25571 Pain in right ankle and joints of right foot: Secondary | ICD-10-CM

## 2018-08-12 DIAGNOSIS — B351 Tinea unguium: Secondary | ICD-10-CM

## 2018-08-12 DIAGNOSIS — G4709 Other insomnia: Secondary | ICD-10-CM

## 2018-08-12 DIAGNOSIS — M25572 Pain in left ankle and joints of left foot: Secondary | ICD-10-CM

## 2018-08-12 DIAGNOSIS — I10 Essential (primary) hypertension: Secondary | ICD-10-CM

## 2018-08-12 DIAGNOSIS — R768 Other specified abnormal immunological findings in serum: Secondary | ICD-10-CM

## 2018-08-12 DIAGNOSIS — M25562 Pain in left knee: Secondary | ICD-10-CM

## 2018-08-12 DIAGNOSIS — G8929 Other chronic pain: Secondary | ICD-10-CM

## 2018-08-12 DIAGNOSIS — M25561 Pain in right knee: Secondary | ICD-10-CM | POA: Diagnosis not present

## 2018-08-12 DIAGNOSIS — Z8639 Personal history of other endocrine, nutritional and metabolic disease: Secondary | ICD-10-CM

## 2018-08-13 ENCOUNTER — Encounter: Payer: Self-pay | Admitting: General Practice

## 2018-08-16 LAB — COMPLETE METABOLIC PANEL WITH GFR
AG Ratio: 1.3 (calc) (ref 1.0–2.5)
ALT: 21 U/L (ref 6–29)
AST: 18 U/L (ref 10–35)
Albumin: 4 g/dL (ref 3.6–5.1)
Alkaline phosphatase (APISO): 56 U/L (ref 31–125)
BUN: 12 mg/dL (ref 7–25)
CO2: 31 mmol/L (ref 20–32)
Calcium: 9.5 mg/dL (ref 8.6–10.2)
Chloride: 103 mmol/L (ref 98–110)
Creat: 0.66 mg/dL (ref 0.50–1.10)
GFR, Est African American: 121 mL/min/{1.73_m2} (ref >=60)
GFR, Est Non African American: 104 mL/min/{1.73_m2} (ref >=60)
Globulin: 3 g/dL (calc) (ref 1.9–3.7)
Glucose, Bld: 93 mg/dL (ref 65–99)
Potassium: 4.4 mmol/L (ref 3.5–5.3)
Sodium: 140 mmol/L (ref 135–146)
TOTAL PROTEIN: 7 g/dL (ref 6.1–8.1)
Total Bilirubin: 0.5 mg/dL (ref 0.2–1.2)

## 2018-08-16 LAB — CBC WITH DIFFERENTIAL/PLATELET
Absolute Monocytes: 499 cells/uL (ref 200–950)
Basophils Absolute: 17 cells/uL (ref 0–200)
Basophils Relative: 0.3 %
EOS ABS: 99 {cells}/uL (ref 15–500)
Eosinophils Relative: 1.7 %
HCT: 38 % (ref 35.0–45.0)
Hemoglobin: 13 g/dL (ref 11.7–15.5)
Lymphs Abs: 1485 cells/uL (ref 850–3900)
MCH: 30.6 pg (ref 27.0–33.0)
MCHC: 34.2 g/dL (ref 32.0–36.0)
MCV: 89.4 fL (ref 80.0–100.0)
MPV: 10.6 fL (ref 7.5–12.5)
Monocytes Relative: 8.6 %
Neutro Abs: 3700 cells/uL (ref 1500–7800)
Neutrophils Relative %: 63.8 %
Platelets: 248 10*3/uL (ref 140–400)
RBC: 4.25 10*6/uL (ref 3.80–5.10)
RDW: 12.1 % (ref 11.0–15.0)
Total Lymphocyte: 25.6 %
WBC: 5.8 10*3/uL (ref 3.8–10.8)

## 2018-08-16 LAB — ANGIOTENSIN CONVERTING ENZYME: Angiotensin-Converting Enzyme: 11 U/L (ref 9–67)

## 2018-08-16 LAB — HLA-B27 ANTIGEN: HLA-B27 Antigen: NEGATIVE

## 2018-08-16 LAB — ALDOLASE: Aldolase: 5.8 U/L (ref <=8.1)

## 2018-08-16 LAB — SJOGRENS SYNDROME-A EXTRACTABLE NUCLEAR ANTIBODY: SSA (Ro) (ENA) Antibody, IgG: <1 AI

## 2018-08-16 LAB — ANTI-SMITH ANTIBODY: ENA SM Ab Ser-aCnc: <1 AI

## 2018-08-16 LAB — 14-3-3 ETA PROTEIN: 14-3-3 eta Protein: <0.2 ng/mL (ref <0.2)

## 2018-08-16 LAB — RNP ANTIBODY: RIBONUCLEIC PROTEIN(ENA) ANTIBODY, IGG: POSITIVE AI — AB

## 2018-08-16 LAB — ANTI-SCLERODERMA ANTIBODY: Scleroderma (Scl-70) (ENA) Antibody, IgG: <1 AI

## 2018-08-16 LAB — C3 AND C4
C3 COMPLEMENT: 147 mg/dL (ref 83–193)
C4 Complement: 47 mg/dL (ref 15–57)

## 2018-08-16 LAB — ANTI-DNA ANTIBODY, DOUBLE-STRANDED: ds DNA Ab: 5 IU/mL — ABNORMAL HIGH

## 2018-08-16 LAB — URIC ACID: Uric Acid, Serum: 6.2 mg/dL (ref 2.5–7.0)

## 2018-08-16 LAB — CYCLIC CITRUL PEPTIDE ANTIBODY, IGG: Cyclic Citrullin Peptide Ab: <16 UNITS

## 2018-08-16 LAB — TSH: TSH: 1.79 mIU/L

## 2018-08-16 LAB — SJOGRENS SYNDROME-B EXTRACTABLE NUCLEAR ANTIBODY: SSB (La) (ENA) Antibody, IgG: <1 AI

## 2018-08-19 NOTE — Progress Notes (Signed)
We will discuss labs at the follow-up visit.

## 2018-08-22 NOTE — Progress Notes (Deleted)
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   History of Present Illness: Marcia Chan is a 48 y.o. female ***   Activities of Daily Living:  Patient reports morning stiffness for *** {minute/hour:19697}.   Patient {ACTIONS;DENIES/REPORTS:21021675::"Denies"} nocturnal pain.  Difficulty dressing/grooming: {ACTIONS;DENIES/REPORTS:21021675::"Denies"} Difficulty climbing stairs: {ACTIONS;DENIES/REPORTS:21021675::"Denies"} Difficulty getting out of chair: {ACTIONS;DENIES/REPORTS:21021675::"Denies"} Difficulty using hands for taps, buttons, cutlery, and/or writing: {ACTIONS;DENIES/REPORTS:21021675::"Denies"}  No Rheumatology ROS completed.   PMFS History:  Patient Active Problem List   Diagnosis Date Noted  . Vitamin D deficiency 12/14/2016  . Plantar fasciitis of left foot 01/06/2014  . Numbness and tingling of left leg 06/09/2013  . Lumbar back pain 12/23/2012  . Lateral epicondylitis 12/23/2012  . GERD (gastroesophageal reflux disease) 05/03/2012  . Seasonal allergic rhinitis 10/08/2011  . General medical examination 10/06/2011  . Cough 06/21/2011  . Hair loss 02/21/2011  . HTN (hypertension) 02/10/2011  . Hyperlipidemia 02/10/2011  . Severe obesity (BMI >= 40) ([place]) 02/10/2011  . Nail fungus 02/10/2011    Past Medical History:  Diagnosis Date  . GERD (gastroesophageal reflux disease)   . Hyperlipidemia   . Hypertension   . Vertigo     Family History  Problem Relation Age of Onset  . Arthritis Mother        OA/DJD  . Heart disease Mother   . Diabetes Mother   . Hypertension Mother   . Cancer Father   . Hypertension Father    No past surgical history on file. Social History   Social History Narrative   G3 P1 A2    Immunization History  Administered Date(s) Administered  . Influenza Split 05/03/2012  . Influenza,inj,Quad PF,6+ Mos 05/06/2013, 04/17/2014, 05/07/2015, 04/17/2016, 04/12/2017, 04/10/2018  . Tdap 04/17/2014     Objective: Vital Signs: There were no vitals taken for this visit.   Physical Exam   Musculoskeletal Exam: ***  CDAI Exam: CDAI Score: Not documented Patient Global Assessment: Not documented; Provider Global Assessment: Not documented Swollen: Not documented; Tender: Not documented Joint Exam   Not documented   There is currently no information documented on the homunculus. Go to the Rheumatology activity and complete the homunculus joint exam.  Investigation: No additional findings.  Imaging: Xr Ankle 2 Views Left  Result Date: 08/12/2018 No tibiotalar or subtalar joint space narrowing was noted.  Inferior calcaneal spur was noted.  Xr Ankle 2 Views Right  Result Date: 08/12/2018 No tibiotalar or subtalar joint space narrowing was noted.  Inferior and posterior calcaneal spurs were noted.  Xr Hand 2 View Left  Result Date: 08/12/2018 No MCP, PIP or DIP narrowing was noted.  No intercarpal radiocarpal joint space narrowing was noted.  No erosive changes were noted. Impression: Unremarkable x-ray of the hand.  Xr Hand 2 View Right  Result Date: 08/12/2018 No MCP, PIP or DIP narrowing was noted.  No intercarpal radiocarpal joint space narrowing was noted.  No erosive changes were noted. Impression: Unremarkable x-ray of the hand.  Xr Knee 3 View Left  Result Date: 08/12/2018 Moderate lateral compartment narrowing was noted.  No chondrocalcinosis was noted.  No patellofemoral narrowing was noted. 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  Xr Knee 3 View Right  Result Date: 08/12/2018 Mild lateral compartment narrowing was noted.  Mild patellofemoral narrowing was noted.  No chondrocalcinosis was noted.  Impression: These findings are consistent with mild osteoarthritis and mild chondromalacia patella.   Recent Labs: Lab Results  Component Value Date   WBC 5.8 08/12/2018   HGB 13.0 08/12/2018   PLT 248 08/12/2018   NA 140 08/12/2018   K 4.4 08/12/2018   CL 103 08/12/2018   CO2 31 08/12/2018   GLUCOSE 93 08/12/2018   BUN 12 08/12/2018   CREATININE 0.66 08/12/2018   BILITOT 0.5 08/12/2018   ALKPHOS 54 01/11/2018   AST 18 08/12/2018   ALT 21 08/12/2018   PROT 7.0 08/12/2018   ALBUMIN 3.9 01/11/2018   CALCIUM 9.5 08/12/2018   GFRAA 121 08/12/2018  August 12, 2018 C3-C4 normal, RNP positive, double-stranded DNA positive, Smith negative, SSA negative, SSB negative, SCL 70-, anti-CCP negative, 14 3 3  eta negative, ACE negative, uric acid 6.2, HLA-B27 negative, aldolase normal, TSH normal  07/01/18: ANA 1:80 NS, sed rate 20, CK 88, RF<14  Speciality Comments: No specialty comments available.  Procedures:  No procedures performed Allergies: Bextra [valdecoxib]   Assessment / Plan:     Visit Diagnoses: No diagnosis found.   Orders: No orders of the defined types were placed in this encounter.  No orders of the defined types were placed in this encounter.   Face-to-face time spent with patient was *** minutes. Greater than 50% of time was spent in counseling and coordination of care.  Follow-Up Instructions: No follow-ups on file.   Bo Merino, MD  Note - This record has been created using Editor, commissioning.  Chart creation errors have been sought, but may not always  have been located. Such creation errors do not reflect on  the standard of medical care.

## 2018-08-26 ENCOUNTER — Encounter: Payer: Self-pay | Admitting: Family Medicine

## 2018-08-26 ENCOUNTER — Other Ambulatory Visit: Payer: Self-pay

## 2018-08-26 ENCOUNTER — Ambulatory Visit: Payer: BC Managed Care – PPO | Admitting: Family Medicine

## 2018-08-26 VITALS — BP 131/84 | HR 79 | Temp 98.6°F | Resp 16 | Ht 67.0 in | Wt 290.4 lb

## 2018-08-26 DIAGNOSIS — M25551 Pain in right hip: Secondary | ICD-10-CM | POA: Diagnosis not present

## 2018-08-26 DIAGNOSIS — I10 Essential (primary) hypertension: Secondary | ICD-10-CM | POA: Diagnosis not present

## 2018-08-26 DIAGNOSIS — E782 Mixed hyperlipidemia: Secondary | ICD-10-CM

## 2018-08-26 DIAGNOSIS — M79661 Pain in right lower leg: Secondary | ICD-10-CM

## 2018-08-26 DIAGNOSIS — M79662 Pain in left lower leg: Secondary | ICD-10-CM

## 2018-08-26 DIAGNOSIS — M25552 Pain in left hip: Secondary | ICD-10-CM

## 2018-08-26 MED ORDER — MELOXICAM 15 MG PO TABS: 15.0000 mg | ORAL_TABLET | Freq: Every day | ORAL | 0 refills | Status: DC

## 2018-08-26 NOTE — Patient Instructions (Addendum)
Schedule your complete physical in 6 months We'll notify you of your lab results and make any changes if needed We'll call you with your Ortho appt START the once daily Meloxicam for pain and inflammation (avoid additional ibuprofen, advil, aleve, etc) You can ADD tylenol as needed for break through pain Continue to stretch/heat/ice for pain relief Call with any questions or concerns Hang in there!

## 2018-08-26 NOTE — Progress Notes (Signed)
   Subjective:    Patient ID: Marcia Chan, female    DOB: 12/01/1970, 48 y.o.   MRN: [id number]  HPI HTN- chronic problem, on Coreg 6.25mg  BID and Triamterene HCTZ 37.5/25mg  daily w/ adequate control.  Denies CP, SOB, HAs, visual changes, edema.  Hyperlipidemia- chronic problem, on Pravastatin 40mg  daily.  Denies abd pain, N/V.  Leg pain- pt reports pain has not improved w/ addition of requip.  Pt admits she only took medication 'for a couple days' b/c she wasn't comfortable w/ possible side effects.  Pt reports leg pain is now 'all the time'.  More noticeable at night.  Both legs are painful but R>L, 'feels like something is crushing my legs'.  Taking multiple NSAIDs daily.  Some relief w/ stretching.  Pain is localized to lower legs bilaterally.  Pt reports leg 'buckling' at hips when she is getting up or walking.  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  Obesity- BMI is 45.48, pt is down 6 lbs since last visit.   Review of Systems For ROS see HPI     Objective:   Physical Exam Vitals signs reviewed.  Constitutional:      General: She is not in acute distress.    Appearance: She is well-developed. She is obese.  HENT:     Head: Normocephalic and atraumatic.  Eyes:     Conjunctiva/sclera: Conjunctivae normal.     Pupils: Pupils are equal, round, and reactive to light.  Neck:     Musculoskeletal: Normal range of motion and neck supple.     Thyroid: No thyromegaly.  Cardiovascular:     Rate and Rhythm: Normal rate and regular rhythm.     Heart sounds: Normal heart sounds. No murmur.  Pulmonary:     Effort: Pulmonary effort is normal. No respiratory distress.     Breath sounds: Normal breath sounds.  Abdominal:     General: There is no distension.     Palpations: Abdomen is soft.     Tenderness: There is no abdominal tenderness.  Musculoskeletal:        General: Tenderness (mild TTP over L lower legs bilaterally) present. No swelling.    Lymphadenopathy:     Cervical: No cervical adenopathy.  Skin:    General: Skin is warm and dry.  Neurological:     Mental Status: She is alert and oriented to person, place, and time.  Psychiatric:        Behavior: Behavior normal.           Assessment & Plan:  Bilateral hip pain- new.  Suspect this is due to pt sitting in small chairs at work and then getting up and down regularly.  Start once daily Meloxicam.  Refer to ortho.  Reviewed supportive care and red flags that should prompt return.  Pt expressed understanding and is in agreement w/ plan.   Lower leg pain- ongoing.  No relief w/ Requip but pt admits she didn't give it a chance.  No obvious abnormality on PE.  Given ongoing pain, will refer to Ortho.  Pt expressed understanding and is in agreement w/ plan.

## 2018-08-27 LAB — LIPID PANEL
Cholesterol: 147 mg/dL (ref 0–200)
HDL: 35.9 mg/dL — ABNORMAL LOW (ref >39.00)
LDL Cholesterol: 92 mg/dL (ref 0–99)
NonHDL: 110.89
Total CHOL/HDL Ratio: 4
Triglycerides: 94 mg/dL (ref 0.0–149.0)
VLDL: 18.8 mg/dL (ref 0.0–40.0)

## 2018-08-30 DIAGNOSIS — M170 Bilateral primary osteoarthritis of knee: Secondary | ICD-10-CM | POA: Insufficient documentation

## 2018-08-30 HISTORY — DX: Bilateral primary osteoarthritis of knee: M17.0

## 2018-08-30 NOTE — Progress Notes (Deleted)
Office Visit Note  Patient: Marcia Chan             Date of Birth: 1971-05-16           MRN: [id number]             PCP: Midge Minium, MD Referring: Midge Minium, MD Visit Date: 09/12/2018 Occupation: @GUAROCC @  Subjective:  No chief complaint on file.   History of Present Illness: Marcia Chan is a 48 y.o. female ***   Activities of Daily Living:  Patient reports morning stiffness for *** {minute/hour:19697}.   Patient {ACTIONS;DENIES/REPORTS:21021675::"Denies"} nocturnal pain.  Difficulty dressing/grooming: {ACTIONS;DENIES/REPORTS:21021675::"Denies"} Difficulty climbing stairs: {ACTIONS;DENIES/REPORTS:21021675::"Denies"} Difficulty getting out of chair: {ACTIONS;DENIES/REPORTS:21021675::"Denies"} Difficulty using hands for taps, buttons, cutlery, and/or writing: {ACTIONS;DENIES/REPORTS:21021675::"Denies"}  No Rheumatology ROS completed.   PMFS History:  Patient Active Problem List   Diagnosis Date Noted  . Vitamin D deficiency 12/14/2016  . Plantar fasciitis of left foot 01/06/2014  . Numbness and tingling of left leg 06/09/2013  . Lumbar back pain 12/23/2012  . Lateral epicondylitis 12/23/2012  . GERD (gastroesophageal reflux disease) 05/03/2012  . Seasonal allergic rhinitis 10/08/2011  . General medical examination 10/06/2011  . Cough 06/21/2011  . Hair loss 02/21/2011  . HTN (hypertension) 02/10/2011  . Hyperlipidemia 02/10/2011  . Severe obesity (BMI >= 40) ([place]) 02/10/2011  . Nail fungus 02/10/2011    Past Medical History:  Diagnosis Date  . GERD (gastroesophageal reflux disease)   . Hyperlipidemia   . Hypertension   . Vertigo     Family History  Problem Relation Age of Onset  . Arthritis Mother        OA/DJD  . Heart disease Mother   . Diabetes Mother   . Hypertension Mother   . Cancer Father   . Hypertension Father    No past surgical history on file. Social History   Social History Narrative   G3 P1 A2    Immunization History  Administered Date(s) Administered  . Influenza Split 05/03/2012  . Influenza,inj,Quad PF,6+ Mos 05/06/2013, 04/17/2014, 05/07/2015, 04/17/2016, 04/12/2017, 04/10/2018  . Tdap 04/17/2014     Objective: Vital Signs: There were no vitals taken for this visit.   Physical Exam   Musculoskeletal Exam: ***  CDAI Exam: CDAI Score: Not documented Patient Global Assessment: Not documented; Provider Global Assessment: Not documented Swollen: Not documented; Tender: Not documented Joint Exam   Not documented   There is currently no information documented on the homunculus. Go to the Rheumatology activity and complete the homunculus joint exam.  Investigation: No additional findings.  Imaging: Xr Ankle 2 Views Left  Result Date: 08/12/2018 No tibiotalar or subtalar joint space narrowing was noted.  Inferior calcaneal spur was noted.  Xr Ankle 2 Views Right  Result Date: 08/12/2018 No tibiotalar or subtalar joint space narrowing was noted.  Inferior and posterior calcaneal spurs were noted.  Xr Hand 2 View Left  Result Date: 08/12/2018 No MCP, PIP or DIP narrowing was noted.  No intercarpal radiocarpal joint space narrowing was noted.  No erosive changes were noted. Impression: Unremarkable x-ray of the hand.  Xr Hand 2 View Right  Result Date: 08/12/2018 No MCP, PIP or DIP narrowing was noted.  No intercarpal radiocarpal joint space narrowing was noted.  No erosive changes were noted. Impression: Unremarkable x-ray of the hand.  Xr Knee 3 View Left  Result Date: 08/12/2018 Moderate lateral compartment narrowing was noted.  No chondrocalcinosis was noted.  No patellofemoral narrowing was noted. 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  Xr Knee 3 View Right  Result Date: 08/12/2018 Mild lateral compartment narrowing was noted.  Mild patellofemoral narrowing was noted.  No chondrocalcinosis was noted.  Impression: These findings are consistent with mild osteoarthritis and mild chondromalacia patella.   Recent Labs: Lab Results  Component Value Date   WBC 5.8 08/12/2018   HGB 13.0 08/12/2018   PLT 248 08/12/2018   NA 140 08/12/2018   K 4.4 08/12/2018   CL 103 08/12/2018   CO2 31 08/12/2018   GLUCOSE 93 08/12/2018   BUN 12 08/12/2018   CREATININE 0.66 08/12/2018   BILITOT 0.5 08/12/2018   ALKPHOS 54 01/11/2018   AST 18 08/12/2018   ALT 21 08/12/2018   PROT 7.0 08/12/2018   ALBUMIN 3.9 01/11/2018   CALCIUM 9.5 08/12/2018   GFRAA 121 08/12/2018  TSH normal, aldolase 5.8 normal, uric acid 6.2, anti-CCP negative, 14 3 3  eta negative, ACE negative, HLA-B27 negative, dsDNA +5, RNP 1.4+, (SCL 70, Smith, SSA, SSB negative) C3-C4 normal  07/01/18: ANA 1:80 NS, sed rate 20, CK 88, RF<14  Speciality Comments: No specialty comments available.  Procedures:  No procedures performed Allergies: Bextra [valdecoxib]   Assessment / Plan:     Visit Diagnoses: No diagnosis found.   Orders: No orders of the defined types were placed in this encounter.  No orders of the defined types were placed in this encounter.   Face-to-face time spent with patient was *** minutes. Greater than 50% of time was spent in counseling and coordination of care.  Follow-Up Instructions: No follow-ups on file.   Bo Merino, MD  Note - This record has been created using Editor, commissioning.  Chart creation errors have been sought, but may not always  have been located. Such creation errors do not reflect on  the standard of medical care.

## 2018-09-02 NOTE — Assessment & Plan Note (Signed)
Chronic problem.  Adequate control.  Asymptomatic.  Check labs.  No anticipated med changes.  Will follow. 

## 2018-09-02 NOTE — Assessment & Plan Note (Signed)
Chronic problem.  Tolerating statin w/o difficulty.  Check labs.  Adjust meds prn  

## 2018-09-02 NOTE — Assessment & Plan Note (Signed)
Pt is down 6 lbs.  Applauded her efforts.  Will continue to follow.

## 2018-09-03 ENCOUNTER — Ambulatory Visit: Payer: Self-pay | Admitting: Rheumatology

## 2018-09-12 ENCOUNTER — Ambulatory Visit: Payer: Self-pay | Admitting: Rheumatology

## 2018-09-24 ENCOUNTER — Other Ambulatory Visit: Payer: Self-pay | Admitting: Family Medicine

## 2018-10-26 ENCOUNTER — Other Ambulatory Visit: Payer: Self-pay | Admitting: Family Medicine

## 2018-10-31 ENCOUNTER — Other Ambulatory Visit: Payer: Self-pay | Admitting: Family Medicine

## 2018-12-02 ENCOUNTER — Ambulatory Visit (INDEPENDENT_AMBULATORY_CARE_PROVIDER_SITE_OTHER): Payer: BC Managed Care – PPO | Admitting: Family Medicine

## 2018-12-02 ENCOUNTER — Encounter: Payer: Self-pay | Admitting: Family Medicine

## 2018-12-02 ENCOUNTER — Other Ambulatory Visit: Payer: Self-pay

## 2018-12-02 DIAGNOSIS — K219 Gastro-esophageal reflux disease without esophagitis: Secondary | ICD-10-CM | POA: Diagnosis not present

## 2018-12-02 MED ORDER — PANTOPRAZOLE SODIUM 40 MG PO TBEC: 40.0000 mg | DELAYED_RELEASE_TABLET | Freq: Two times a day (BID) | ORAL | 0 refills | Status: DC

## 2018-12-02 MED ORDER — SUCRALFATE 1 G PO TABS: 1.0000 g | ORAL_TABLET | Freq: Three times a day (TID) | ORAL | 0 refills | Status: DC

## 2018-12-02 NOTE — Progress Notes (Signed)
Virtual Visit via Video   I connected with patient on 12/02/18 at  9:00 AM EDT by a video enabled telemedicine application and verified that I am speaking with the correct person using two identifiers.  Location patient: Home Location provider: [facility], Office Persons participating in the virtual visit: Patient, Provider, CMA (Jess B)  I discussed the limitations of evaluation and management by telemedicine and the availability of in person appointments. The patient expressed understanding and agreed to proceed.  Subjective:   HPI:   GERD- 'my acid reflux is acting up.  It's just getting worse'.  No particular trigger- 'it doesn't matter what I eat or drink'.  + regurg- 'i'm spitting up in my mouth'.  Taking pantoprazole daily.  No new or different medications.  No longer taking Meloxicam.  Has some days where 'stomach feels like it's burning'.  + bloating after eating.  + coughing after eating.  Pt would like a GI referral.  ROS:   See pertinent positives and negatives per HPI.  Patient Active Problem List   Diagnosis Date Noted  . Primary osteoarthritis of both knees 08/30/2018  . Vitamin D deficiency 12/14/2016  . Plantar fasciitis of left foot 01/06/2014  . Numbness and tingling of left leg 06/09/2013  . Lumbar back pain 12/23/2012  . Lateral epicondylitis 12/23/2012  . GERD (gastroesophageal reflux disease) 05/03/2012  . Seasonal allergic rhinitis 10/08/2011  . General medical examination 10/06/2011  . Cough 06/21/2011  . Hair loss 02/21/2011  . HTN (hypertension) 02/10/2011  . Hyperlipidemia 02/10/2011  . Severe obesity (BMI >= 40) ([facility]) 02/10/2011  . Nail fungus 02/10/2011    Social History   Tobacco Use  . Smoking status: Never Smoker  . Smokeless tobacco: Never Used  Substance Use Topics  . Alcohol use: No    Current Outpatient Medications:  .  carvedilol (COREG) 6.25 MG tablet, TAKE 1 TABLET BY MOUTH TWICE DAILY WITH A MEAL, Disp: 180 tablet,  Rfl: 0 .  clotrimazole-betamethasone (LOTRISONE) cream, APPLY EXTERNALLY TO THE AFFECTED AREA TWICE DAILY, Disp: 30 g, Rfl: 0 .  levonorgestrel (MIRENA) 20 MCG/24HR IUD, 1 each by Intrauterine route once., Disp: , Rfl:  .  meclizine (ANTIVERT) 25 MG tablet, Take 1 tablet (25 mg total) by mouth 3 (three) times daily as needed for dizziness., Disp: 30 tablet, Rfl: 0 .  meloxicam (MOBIC) 15 MG tablet, Take 1 tablet (15 mg total) by mouth daily., Disp: 30 tablet, Rfl: 0 .  pantoprazole (PROTONIX) 40 MG tablet, TAKE 1 TABLET(40 MG) BY MOUTH DAILY, Disp: 90 tablet, Rfl: 1 .  pravastatin (PRAVACHOL) 40 MG tablet, TAKE 1 TABLET BY MOUTH DAILY, Disp: 90 tablet, Rfl: 1 .  triamterene-hydrochlorothiazide (MAXZIDE-25) 37.5-25 MG tablet, TAKE 1 TABLET BY MOUTH DAILY, Disp: 90 tablet, Rfl: 0  Allergies  Allergen Reactions  . Bextra [Valdecoxib]     Rash     Objective:   There were no vitals taken for this visit.  AAOx3, NAD NCAT, EOMI No obvious CN deficits Coloring WNL Pt is able to speak clearly, coherently without shortness of breath or increased work of breathing.  Thought process is linear.  Mood is appropriate.   Assessment and Plan:   GERD- deteriorated.  Pt is now having regurg, sour brash, and cough after eating.  Reviewed dietary and lifestyle modifications that could improve symptoms.  Will also increase PPI to BID and add Carafate to allow healing of possible gastritis.  Refer to GI for complete evaluation and tx at  pt's request.   Neena RhymesKatherine Melia Hopes, MD 12/02/2018

## 2018-12-02 NOTE — Progress Notes (Signed)
I have discussed the procedure for the virtual visit with the patient who has given consent to proceed with assessment and treatment.   Marcia Chan L Ifeoluwa Beller, CMA     

## 2018-12-16 ENCOUNTER — Other Ambulatory Visit: Payer: Self-pay

## 2018-12-16 ENCOUNTER — Encounter: Payer: Self-pay | Admitting: Family Medicine

## 2018-12-16 ENCOUNTER — Ambulatory Visit (INDEPENDENT_AMBULATORY_CARE_PROVIDER_SITE_OTHER): Payer: BC Managed Care – PPO | Admitting: Family Medicine

## 2018-12-16 DIAGNOSIS — R140 Abdominal distension (gaseous): Secondary | ICD-10-CM

## 2018-12-16 DIAGNOSIS — K219 Gastro-esophageal reflux disease without esophagitis: Secondary | ICD-10-CM

## 2018-12-16 NOTE — Progress Notes (Signed)
I have discussed the procedure for the virtual visit with the patient who has given consent to proceed with assessment and treatment.   Pt unable to obtain vitals. Also stated medication is not helping. The only thing that works is gingerale.   Marcia Chan, CMA

## 2018-12-16 NOTE — Progress Notes (Signed)
Virtual Visit via Video   I connected with patient on 12/16/18 at 10:40 AM EDT by a video enabled telemedicine application and verified that I am speaking with the correct person using two identifiers.  Location patient: [place] [facility] Persons participating in the virtual visit: Patient, Provider, CMA (Jess B)  I discussed the limitations of evaluation and management by telemedicine and the availability of in person appointments. The patient expressed understanding and agreed to proceed.  Subjective:   HPI:   GERD- At last visit Protonix was increased to 40mg  BID and pt was started on Carafate.  Pt has noticed some improvement w/ doubling Protonix to BID.  'I have not splashed back up in my mouth at all'.  Not longer feels that 'food is just sitting in my throat'.  Was unable to take Carafate due to abdominal cramping.  Pt reports sxs improvement w/ Ginger Ale.  Temporary relief w/ Pepto.  'the burning is not as bad.  It depends on what i'm eating'.  Pt doesn't eat spicy foods or tomato based foods.  Having to eliminate cabbage, beans (baked beans, pinto beans).  Pt reports abdomen will 'swell like a balloon'.  Pt has GI appt next Friday.  No N/V.  Denies changes to bowels w/ exception of improved regularity.  ROS:   See pertinent positives and negatives per HPI.  Patient Active Problem List   Diagnosis Date Noted  . Primary osteoarthritis of both knees 08/30/2018  . Vitamin D deficiency 12/14/2016  . Plantar fasciitis of left foot 01/06/2014  . Numbness and tingling of left leg 06/09/2013  . Lumbar back pain 12/23/2012  . Lateral epicondylitis 12/23/2012  . GERD (gastroesophageal reflux disease) 05/03/2012  . Seasonal allergic rhinitis 10/08/2011  . General medical examination 10/06/2011  . Cough 06/21/2011  . Hair loss 02/21/2011  . HTN (hypertension) 02/10/2011  . Hyperlipidemia 02/10/2011  . Severe obesity (BMI >= 40) ([facility]) 02/10/2011   . Nail fungus 02/10/2011    Social History   Tobacco Use  . Smoking status: Never Smoker  . Smokeless tobacco: Never Used  Substance Use Topics  . Alcohol use: No    Current Outpatient Medications:  .  carvedilol (COREG) 6.25 MG tablet, TAKE 1 TABLET BY MOUTH TWICE DAILY WITH A MEAL, Disp: 180 tablet, Rfl: 0 .  clotrimazole-betamethasone (LOTRISONE) cream, APPLY EXTERNALLY TO THE AFFECTED AREA TWICE DAILY, Disp: 30 g, Rfl: 0 .  levonorgestrel (MIRENA) 20 MCG/24HR IUD, 1 each by Intrauterine route once., Disp: , Rfl:  .  meclizine (ANTIVERT) 25 MG tablet, Take 1 tablet (25 mg total) by mouth 3 (three) times daily as needed for dizziness., Disp: 30 tablet, Rfl: 0 .  meloxicam (MOBIC) 15 MG tablet, Take 1 tablet (15 mg total) by mouth daily., Disp: 30 tablet, Rfl: 0 .  pantoprazole (PROTONIX) 40 MG tablet, Take 1 tablet (40 mg total) by mouth 2 (two) times daily., Disp: 180 tablet, Rfl: 0 .  pravastatin (PRAVACHOL) 40 MG tablet, TAKE 1 TABLET BY MOUTH DAILY, Disp: 90 tablet, Rfl: 1 .  sucralfate (CARAFATE) 1 g tablet, Take 1 tablet (1 g total) by mouth 4 (four) times daily -  with meals and at bedtime., Disp: 90 tablet, Rfl: 0 .  triamterene-hydrochlorothiazide (MAXZIDE-25) 37.5-25 MG tablet, TAKE 1 TABLET BY MOUTH DAILY, Disp: 90 tablet, Rfl: 0  Allergies  Allergen Reactions  . Bextra [Valdecoxib]     Rash     Objective:   There were no vitals  taken for this visit. AAOx3, NAD NCAT, EOMI No obvious CN deficits Coloring WNL Pt is able to speak clearly, coherently without shortness of breath or increased work of breathing.  Thought process is linear.  Mood is appropriate.   Assessment and Plan:   GERD- improved since increasing Protonix to 40mg  BID.  Pt to continue at this dose until she sees GI  Bloating- continues.  Improves temporarily w/ Pepto and Ginger Ale.  Encouraged Beano prior to eating and then GasX or Phazyme if needed until she sees GI next week.  Pt expressed  understanding and is in agreement w/ plan.    Annye Asa, MD 12/16/2018

## 2018-12-26 ENCOUNTER — Encounter: Payer: BC Managed Care – PPO | Admitting: Physician Assistant

## 2018-12-26 ENCOUNTER — Other Ambulatory Visit: Payer: Self-pay

## 2018-12-27 ENCOUNTER — Other Ambulatory Visit: Payer: Self-pay

## 2018-12-27 ENCOUNTER — Telehealth (INDEPENDENT_AMBULATORY_CARE_PROVIDER_SITE_OTHER): Payer: BC Managed Care – PPO | Admitting: Gastroenterology

## 2018-12-27 ENCOUNTER — Encounter: Payer: Self-pay | Admitting: Gastroenterology

## 2018-12-27 VITALS — Ht 66.0 in | Wt 280.0 lb

## 2018-12-27 DIAGNOSIS — K219 Gastro-esophageal reflux disease without esophagitis: Secondary | ICD-10-CM | POA: Diagnosis not present

## 2018-12-27 DIAGNOSIS — Z800 Family history of malignant neoplasm of digestive organs: Secondary | ICD-10-CM | POA: Diagnosis not present

## 2018-12-27 DIAGNOSIS — R6881 Early satiety: Secondary | ICD-10-CM | POA: Diagnosis not present

## 2018-12-27 DIAGNOSIS — R634 Abnormal weight loss: Secondary | ICD-10-CM

## 2018-12-27 DIAGNOSIS — Z1211 Encounter for screening for malignant neoplasm of colon: Secondary | ICD-10-CM

## 2018-12-27 DIAGNOSIS — R140 Abdominal distension (gaseous): Secondary | ICD-10-CM

## 2018-12-27 DIAGNOSIS — Z1212 Encounter for screening for malignant neoplasm of rectum: Secondary | ICD-10-CM

## 2018-12-27 MED ORDER — CLENPIQ 10-3.5-12 MG-GM -GM/160ML PO SOLN: 1.0000 | ORAL | 0 refills | Status: DC

## 2018-12-27 NOTE — Patient Instructions (Signed)
If you are age 49 or older, your body mass index should be between 23-30. Your Body mass index is 45.19 kg/m. If this is out of the aforementioned range listed, please consider follow up with your Primary Care Provider.  If you are age 47 or younger, your body mass index should be between 19-25. Your Body mass index is 45.19 kg/m. If this is out of the aformentioned range listed, please consider follow up with your Primary Care Provider.   To help prevent the possible spread of infection to our patients, communities, and staff; we will be implementing the following measures:  As of now we are not allowing any visitors/family members to accompany you to any upcoming appointments with [facility]. If you have any concerns about this please contact our office to discuss prior to the appointment.   You have been scheduled for an endoscopy and colonoscopy. Please follow the written instructions given to you at your visit today. Please pick up your prep supplies at the pharmacy within the next 1-3 days. If you use inhalers (even only as needed), please bring them with you on the day of your procedure. Your physician has requested that you go to www.startemmi.com and enter the access code given to you at your visit today. This web site gives a general overview about your procedure. However, you should still follow specific instructions given to you by our office regarding your preparation for the procedure.  We have sent the following medications to your pharmacy for you to pick up at your convenience: Clenpiq  It was a pleasure to see you today!  Vito Cirigliano, D.O.

## 2018-12-27 NOTE — Progress Notes (Signed)
Chief Complaint: GERD, bloating, CRC screening  Referring Provider:     Sheliah Hatchabori, Katherine E, MD   HPI:    Due to current restrictions/limitations of in-office visits due to the COVID-19 pandemic, this scheduled clinical appointment was converted to a telehealth virtual consultation using Doximity.  -Time of medical discussion: 25 minutes -The patient did consent to this virtual visit and is aware of possible charges through their insurance for this visit.  -Names of all parties present: Marcia Chan (patient), Doristine LocksVito Khiree Bukhari, DO, [facility] (physician) -Patient location: Home -Physician location: Office  Marcia BunFrancesca M Name is a 48 y.o. female with a history of hypertension, hyperlipidemia, obesity, referred to the Gastroenterology Clinic for evaluation of GERD.  Reflux symptoms characterized by heartburn, regurgitation, sour brash, postprandial cough. Sxs have been present for years, and worsening this year. Previously treated with omeprazole approx 5 years ago. No dysphagia.   Was seen by her PCM for this issue, with Protonix increased to 40 mg bid and added Carafate earlier this month with some improvement, but not resolution of reflux symptoms.  Globus resolved with high-dose PPI.  Stopped Carafate due to abdominal cramping.  Has been avoiding spicy foods and tomato based foods.  Separately, she endorses post prandial abdominal bloating. Occurs within minutes of eating, and now with early satiety. Sxs last a few hours. Has been present for 4 months or so. Thinks it is more noticeable now that she isnt working d/t COVID. Worse with cabbage and beans, which she has eliminated from her diet.  Does have improvement with Pepto-Bismol and ginger ale, and was previously recommended to take Beano and Gas-X. Has lost 8# in the last 2 months. No night sweats, fever.   Normal CBC and CMP in 08/2018.  Reports she had an EGD >5 years ago. Unsure of results but thinks it was normal. No  previous colonoscopy.  Thinks her father may have had CRC requiring chemotherapy; ultimately died of a boating accident.  Otherwise, no known family history of GI malignancy, liver disease, pancreatic disease, or IBD.   Past medical history, past surgical history, social history, family history, medications, and allergies reviewed in the chart and with patient.    Past Medical History:  Diagnosis Date  . GERD (gastroesophageal reflux disease)   . Hyperlipidemia   . Hypertension   . Vertigo      History reviewed. No pertinent surgical history. Family History  Problem Relation Age of Onset  . Arthritis Mother        OA/DJD  . Heart disease Mother   . Diabetes Mother   . Hypertension Mother   . Cancer Father   . Hypertension Father   . Colon cancer Neg Hx    Social History   Tobacco Use  . Smoking status: Never Smoker  . Smokeless tobacco: Never Used  Substance Use Topics  . Alcohol use: No  . Drug use: No   Current Outpatient Medications  Medication Sig Dispense Refill  . carvedilol (COREG) 6.25 MG tablet TAKE 1 TABLET BY MOUTH TWICE DAILY WITH A MEAL 180 tablet 0  . clotrimazole-betamethasone (LOTRISONE) cream APPLY EXTERNALLY TO THE AFFECTED AREA TWICE DAILY 30 g 0  . levonorgestrel (MIRENA) 20 MCG/24HR IUD 1 each by Intrauterine route once.    . meclizine (ANTIVERT) 25 MG tablet Take 1 tablet (25 mg total) by mouth 3 (three) times daily as needed for dizziness. 30 tablet 0  .  pantoprazole (PROTONIX) 40 MG tablet Take 1 tablet (40 mg total) by mouth 2 (two) times daily. 180 tablet 0  . pravastatin (PRAVACHOL) 40 MG tablet TAKE 1 TABLET BY MOUTH DAILY 90 tablet 1  . triamterene-hydrochlorothiazide (MAXZIDE-25) 37.5-25 MG tablet TAKE 1 TABLET BY MOUTH DAILY 90 tablet 0  . meloxicam (MOBIC) 15 MG tablet Take 1 tablet (15 mg total) by mouth daily. (Patient not taking: Reported on 12/27/2018) 30 tablet 0  . sucralfate (CARAFATE) 1 g tablet Take 1 tablet (1 g total) by mouth 4  (four) times daily -  with meals and at bedtime. (Patient not taking: Reported on 12/27/2018) 90 tablet 0   No current facility-administered medications for this visit.    Allergies  Allergen Reactions  . Bextra [Valdecoxib]     Rash      Review of Systems: All systems reviewed and negative except where noted in HPI.     Physical Exam:    Complete physical exam not completed due to the nature of this telehealth communication.   Gen: Awake, alert, and oriented, and well communicative. HEENT: EOMI, non-icteric sclera, NCAT, MMM Neck: Normal movement of head and neck Pulm: No labored breathing, speaking in full sentences without conversational dyspnea Derm: No apparent lesions or bruising in visible field MS: Moves all visible extremities without noticeable abnormality Psych: Pleasant, cooperative, normal speech, thought processing seemingly intact   ASSESSMENT AND PLAN;   1) GERD 2) Bloating 3) Early satiety 4) Weight loss  Certainly has typical reflux symptoms, which have been responsive to high-dose PPI therapy.  Discussed the pathophysiology of reflux at length, to include possible hiatal hernia.  She has additional UGI symptoms to include abdominal bloating, early satiety in the setting of unexplained 8#weight loss.  Plan to evaluate and treat as below:  -Resume Protonix as prescribed -EGD with gastric and duodenal biopsies -Assess for erosive esophagitis, LES laxity, hiatal hernia - Will email her a copy of low FODMAP diet -Continue to avoid trigger foods for both reflux and abdominal bloating - Gas-X or Beano as needed for bloating - If EGD unrevealing no improvement, can consider SIBO testing versus empiric treatment versus cross-sectional imaging  5) CRC Screening 6) Family history of CRC  Thinks her father had CRC, requiring chemotherapy.  She is otherwise without lower GI symptoms.  Due for age-appropriate CRC screening anyway in this 48 year old  African-American female.  -Schedule for colonoscopy at time of EGD as above  The indications, risks, and benefits of EGD and colonoscopy were explained to the patient in detail. Risks include but are not limited to bleeding, perforation, adverse reaction to medications, and cardiopulmonary compromise. Sequelae include but are not limited to the possibility of surgery, hositalization, and mortality. The patient verbalized understanding and wished to proceed. All questions answered, referred to scheduler and bowel prep ordered. Further recommendations pending results of the exam.     Lavena Bullion, DO, FACG  12/27/2018, 10:57 AM   Midge Minium, MD

## 2018-12-27 NOTE — Progress Notes (Signed)
Erroneous encounter. Patient canceled.  

## 2018-12-30 ENCOUNTER — Other Ambulatory Visit: Payer: Self-pay | Admitting: Family Medicine

## 2019-01-02 ENCOUNTER — Telehealth: Payer: Self-pay | Admitting: Gastroenterology

## 2019-01-02 NOTE — Telephone Encounter (Signed)
Pt requested a call back to discuss prep for colon scheduled 01/10/19.

## 2019-01-02 NOTE — Telephone Encounter (Signed)
Patient coming by the office for a sample of prep.

## 2019-01-09 ENCOUNTER — Telehealth: Payer: Self-pay | Admitting: Gastroenterology

## 2019-01-09 NOTE — Telephone Encounter (Signed)

## 2019-01-10 ENCOUNTER — Ambulatory Visit (AMBULATORY_SURGERY_CENTER): Payer: BC Managed Care – PPO | Admitting: Gastroenterology

## 2019-01-10 ENCOUNTER — Encounter: Payer: Self-pay | Admitting: Gastroenterology

## 2019-01-10 ENCOUNTER — Other Ambulatory Visit: Payer: Self-pay

## 2019-01-10 VITALS — BP 132/77 | HR 70 | Temp 98.9°F | Resp 20 | Ht 66.0 in | Wt 280.0 lb

## 2019-01-10 DIAGNOSIS — R6881 Early satiety: Secondary | ICD-10-CM | POA: Diagnosis not present

## 2019-01-10 DIAGNOSIS — R140 Abdominal distension (gaseous): Secondary | ICD-10-CM | POA: Diagnosis not present

## 2019-01-10 DIAGNOSIS — Z800 Family history of malignant neoplasm of digestive organs: Secondary | ICD-10-CM | POA: Diagnosis not present

## 2019-01-10 DIAGNOSIS — R634 Abnormal weight loss: Secondary | ICD-10-CM

## 2019-01-10 DIAGNOSIS — Z1211 Encounter for screening for malignant neoplasm of colon: Secondary | ICD-10-CM | POA: Diagnosis present

## 2019-01-10 DIAGNOSIS — K219 Gastro-esophageal reflux disease without esophagitis: Secondary | ICD-10-CM

## 2019-01-10 MED ORDER — SODIUM CHLORIDE 0.9 % IV SOLN: 500.0000 mL | Freq: Once | INTRAVENOUS | Status: DC

## 2019-01-10 NOTE — Op Note (Signed)
[facility] Patient Name: Marcia Chan Procedure Date: 01/10/2019 1:00 PM MRN: [id number] Endoscopist: Gerrit Heck , MD Age: 48 Referring MD:  Date of Birth: 24-Nov-1970 Gender: Female Account #: [id number] Procedure:                Upper GI endoscopy Indications:              Suspected esophageal reflux, Post prandial                            Abdominal bloating, Early satiety, Globus                            sensation, 8 lb Weight loss Medicines:                Monitored Anesthesia Care Procedure:                Pre-Anesthesia Assessment:                           - Prior to the procedure, a History and Physical                            was performed, and patient medications and                            allergies were reviewed. The patient's tolerance of                            previous anesthesia was also reviewed. The risks                            and benefits of the procedure and the sedation                            options and risks were discussed with the patient.                            All questions were answered, and informed consent                            was obtained. Prior Anticoagulants: The patient has                            taken no previous anticoagulant or antiplatelet                            agents. ASA Grade Assessment: II - A patient with                            mild systemic disease. After reviewing the risks                            and benefits, the patient was deemed in  satisfactory condition to undergo the procedure.                           After obtaining informed consent, the endoscope was                            passed under direct vision. Throughout the                            procedure, the patient's blood pressure, pulse, and                            oxygen saturations were monitored continuously. The                            Endoscope was introduced through the  mouth, and                            advanced to the second part of duodenum. The upper                            GI endoscopy was accomplished without difficulty.                            The patient tolerated the procedure well. Scope In: Scope Out: Findings:                 The examined esophagus was normal.                           The entire examined stomach was normal. Biopsies                            were taken with a cold forceps for Helicobacter                            pylori testing. Estimated blood loss was minimal.                           The duodenal bulb, first portion of the duodenum                            and second portion of the duodenum were normal.                            Biopsies for histology were taken with a cold                            forceps for evaluation of celiac disease. Estimated                            blood loss was minimal. Complications:            No immediate complications. Estimated Blood Loss:     Estimated blood loss was minimal.  Impression:               - Normal esophagus.                           - Normal stomach. Biopsied.                           - Normal duodenal bulb, first portion of the                            duodenum and second portion of the duodenum.                            Biopsied. Recommendation:           - Patient has a contact number available for                            emergencies. The signs and symptoms of potential                            delayed complications were discussed with the                            patient. Return to normal activities tomorrow.                            Written discharge instructions were provided to the                            patient.                           - Resume previous diet.                           - Continue present medications.                           - Await pathology results.                           - Return to GI clinic at  appointment to be                            scheduled. Doristine LocksVito Palmer Shorey, MD 01/10/2019 1:37:17 PM

## 2019-01-10 NOTE — Patient Instructions (Signed)
Impression/Recommendations:  Diverticulosis handout given to patient.  Repeat colonoscopy in 5 years for screening purposes due to family history of colon cancer.  Resume previous diet. Continue present medications. Await pathology results.  Return to GI clinic at appointment to be scheduled.  YOU HAD AN ENDOSCOPIC PROCEDURE TODAY AT [facility]:   Refer to the procedure report that was given to you for any specific questions about what was found during the examination.  If the procedure report does not answer your questions, please call your gastroenterologist to clarify.  If you requested that your care partner not be given the details of your procedure findings, then the procedure report has been included in a sealed envelope for you to review at your convenience later.  YOU SHOULD EXPECT: Some feelings of bloating in the abdomen. Passage of more gas than usual.  Walking can help get rid of the air that was put into your GI tract during the procedure and reduce the bloating. If you had a lower endoscopy (such as a colonoscopy or flexible sigmoidoscopy) you may notice spotting of blood in your stool or on the toilet paper. If you underwent a bowel prep for your procedure, you may not have a normal bowel movement for a few days.  Please Note:  You might notice some irritation and congestion in your nose or some drainage.  This is from the oxygen used during your procedure.  There is no need for concern and it should clear up in a day or so.  SYMPTOMS TO REPORT IMMEDIATELY:   Following lower endoscopy (colonoscopy or flexible sigmoidoscopy):  Excessive amounts of blood in the stool  Significant tenderness or worsening of abdominal pains  Swelling of the abdomen that is new, acute  Fever of 100F or higher   Following upper endoscopy (EGD)  Vomiting of blood or coffee ground material  New chest pain or pain under the shoulder blades  Painful or persistently difficult  swallowing  New shortness of breath  Fever of 100F or higher  Black, tarry-looking stools  For urgent or emergent issues, a gastroenterologist can be reached at any hour by calling [phone].   DIET:  We do recommend a small meal at first, but then you may proceed to your regular diet.  Drink plenty of fluids but you should avoid alcoholic beverages for 24 hours.  ACTIVITY:  You should plan to take it easy for the rest of today and you should NOT DRIVE or use heavy machinery until tomorrow (because of the sedation medicines used during the test).    FOLLOW UP: Our staff will call the number listed on your records 48-72 hours following your procedure to check on you and address any questions or concerns that you may have regarding the information given to you following your procedure. If we do not reach you, we will leave a message.  We will attempt to reach you two times.  During this call, we will ask if you have developed any symptoms of COVID 19. If you develop any symptoms (ie: fever, flu-like symptoms, shortness of breath, cough etc.) before then, please call [phone].  If you test positive for Covid 19 in the 2 weeks post procedure, please call and report this information to [place].    If any biopsies were taken you will be contacted by phone or by letter within the next 1-3 weeks.  Please call us at [phone] if you have not heard about the biopsies in 3  weeks.    SIGNATURES/CONFIDENTIALITY: You and/or your care partner have signed paperwork which will be entered into your electronic medical record.  These signatures attest to the fact that that the information above on your After Visit Summary has been reviewed and is understood.  Full responsibility of the confidentiality of this discharge information lies with you and/or your care-partner.

## 2019-01-10 NOTE — Progress Notes (Signed)
Called to room to assist during endoscopic procedure.  Patient ID and intended procedure confirmed with present staff. Received instructions for my participation in the procedure from the performing physician.  

## 2019-01-10 NOTE — Op Note (Signed)
[facility] Patient Name: Marcia Chan Procedure Date: 01/10/2019 1:00 PM MRN: [id number] Endoscopist: Gerrit Heck , MD Age: 48 Referring MD:  Date of Birth: [birth year] Gender: Female Account #: [id number] Procedure:                Colonoscopy Indications:              Screening in patient at increased risk: Colorectal                            cancer in father before age 62 Medicines:                Monitored Anesthesia Care Procedure:                Pre-Anesthesia Assessment:                           - Prior to the procedure, a History and Physical                            was performed, and patient medications and                            allergies were reviewed. The patient's tolerance of                            previous anesthesia was also reviewed. The risks                            and benefits of the procedure and the sedation                            options and risks were discussed with the patient.                            All questions were answered, and informed consent                            was obtained. Prior Anticoagulants: The patient has                            taken no previous anticoagulant or antiplatelet                            agents. ASA Grade Assessment: II - A patient with                            mild systemic disease. After reviewing the risks                            and benefits, the patient was deemed in                            satisfactory condition to undergo the procedure.  After obtaining informed consent, the colonoscope                            was passed under direct vision. Throughout the                            procedure, the patient's blood pressure, pulse, and                            oxygen saturations were monitored continuously. The                            Colonoscope was introduced through the anus and                            advanced to the the  terminal ileum. The colonoscopy                            was performed without difficulty. The patient                            tolerated the procedure well. The quality of the                            bowel preparation was adequate. The ileocecal                            valve, appendiceal orifice, and rectum were                            photographed. Scope In: 1:16:38 PM Scope Out: 1:33:25 PM Scope Withdrawal Time: 0 hours 9 minutes 25 seconds  Total Procedure Duration: 0 hours 16 minutes 47 seconds  Findings:                 The perianal and digital rectal examinations were                            normal.                           A few small-mouthed diverticula were found in the                            sigmoid colon.                           The exam was otherwise normal throughout the                            remainder of the colon.                           The retroflexed view of the distal rectum and anal  verge was normal and showed no anal or rectal                            abnormalities.                           The terminal ileum appeared normal. Complications:            No immediate complications. Estimated Blood Loss:     Estimated blood loss: none. Impression:               - Diverticulosis in the sigmoid colon.                           - The distal rectum and anal verge are normal on                            retroflexion view.                           - The examined portion of the ileum was normal.                           - No specimens collected. Recommendation:           - Patient has a contact number available for                            emergencies. The signs and symptoms of potential                            delayed complications were discussed with the                            patient. Return to normal activities tomorrow.                            Written discharge instructions were provided to the                             patient.                           - Resume previous diet.                           - Continue present medications.                           - Repeat colonoscopy in 5 years for screening                            purposes due to family history of colon cancer.                           - Return to GI office PRN. Doristine LocksVito Jakevious Hollister, MD 01/10/2019 1:40:50 PM

## 2019-01-10 NOTE — Progress Notes (Signed)
A/ox3, pleased with MAC, report to RN 

## 2019-01-10 NOTE — Progress Notes (Signed)
Temp [place] Vital CW

## 2019-01-12 ENCOUNTER — Emergency Department (HOSPITAL_BASED_OUTPATIENT_CLINIC_OR_DEPARTMENT_OTHER): Payer: BC Managed Care – PPO

## 2019-01-12 ENCOUNTER — Other Ambulatory Visit: Payer: Self-pay

## 2019-01-12 ENCOUNTER — Encounter (HOSPITAL_BASED_OUTPATIENT_CLINIC_OR_DEPARTMENT_OTHER): Payer: Self-pay | Admitting: *Deleted

## 2019-01-12 ENCOUNTER — Telehealth: Payer: Self-pay | Admitting: Nurse Practitioner

## 2019-01-12 ENCOUNTER — Emergency Department (HOSPITAL_BASED_OUTPATIENT_CLINIC_OR_DEPARTMENT_OTHER)
Admission: EM | Admit: 2019-01-12 | Discharge: 2019-01-12 | Disposition: A | Discharge status: Home / Self Care | Payer: BC Managed Care – PPO | Attending: Emergency Medicine | Admitting: Emergency Medicine

## 2019-01-12 DIAGNOSIS — Z79899 Other long term (current) drug therapy: Secondary | ICD-10-CM | POA: Diagnosis not present

## 2019-01-12 DIAGNOSIS — K5732 Diverticulitis of large intestine without perforation or abscess without bleeding: Secondary | ICD-10-CM | POA: Diagnosis not present

## 2019-01-12 DIAGNOSIS — R1032 Left lower quadrant pain: Secondary | ICD-10-CM | POA: Diagnosis present

## 2019-01-12 DIAGNOSIS — K5792 Diverticulitis of intestine, part unspecified, without perforation or abscess without bleeding: Secondary | ICD-10-CM

## 2019-01-12 DIAGNOSIS — I10 Essential (primary) hypertension: Secondary | ICD-10-CM | POA: Insufficient documentation

## 2019-01-12 LAB — URINALYSIS, ROUTINE W REFLEX MICROSCOPIC
Bilirubin Urine: NEGATIVE
Glucose, UA: NEGATIVE mg/dL
Ketones, ur: NEGATIVE mg/dL
Leukocytes,Ua: NEGATIVE
Nitrite: NEGATIVE
Protein, ur: NEGATIVE mg/dL
Specific Gravity, Urine: 1.015 (ref 1.005–1.030)
pH: 7 (ref 5.0–8.0)

## 2019-01-12 LAB — COMPREHENSIVE METABOLIC PANEL
ALT: 29 U/L (ref 0–44)
AST: 24 U/L (ref 15–41)
Albumin: 4.1 g/dL (ref 3.5–5.0)
Alkaline Phosphatase: 56 U/L (ref 38–126)
Anion gap: 9 (ref 5–15)
BUN: 10 mg/dL (ref 6–20)
CO2: 27 mmol/L (ref 22–32)
Calcium: 9.3 mg/dL (ref 8.9–10.3)
Chloride: 99 mmol/L (ref 98–111)
Creatinine, Ser: 0.8 mg/dL (ref 0.44–1.00)
GFR calc Af Amer: >60 mL/min (ref >60)
GFR calc non Af Amer: >60 mL/min (ref >60)
Glucose, Bld: 90 mg/dL (ref 70–99)
Potassium: 3.2 mmol/L — ABNORMAL LOW (ref 3.5–5.1)
Sodium: 135 mmol/L (ref 135–145)
Total Bilirubin: 0.7 mg/dL (ref 0.3–1.2)
Total Protein: 7.9 g/dL (ref 6.5–8.1)

## 2019-01-12 LAB — CBC
HCT: 40.6 % (ref 36.0–46.0)
Hemoglobin: 13.4 g/dL (ref 12.0–15.0)
MCH: 29.7 pg (ref 26.0–34.0)
MCHC: 33 g/dL (ref 30.0–36.0)
MCV: 90 fL (ref 80.0–100.0)
Platelets: 254 10*3/uL (ref 150–400)
RBC: 4.51 MIL/uL (ref 3.87–5.11)
RDW: 12.1 % (ref 11.5–15.5)
WBC: 9.3 10*3/uL (ref 4.0–10.5)
nRBC: 0 % (ref 0.0–0.2)

## 2019-01-12 LAB — URINALYSIS, MICROSCOPIC (REFLEX): WBC, UA: NONE SEEN WBC/hpf (ref 0–5)

## 2019-01-12 LAB — PREGNANCY, URINE: Preg Test, Ur: NEGATIVE

## 2019-01-12 LAB — LIPASE, BLOOD: Lipase: 37 U/L (ref 11–51)

## 2019-01-12 MED ORDER — CIPROFLOXACIN HCL 500 MG PO TABS
500.0000 mg | ORAL_TABLET | Freq: Once | ORAL | Status: AC
  Administered 2019-01-12: 500 mg via ORAL
  Filled 2019-01-12: qty 1

## 2019-01-12 MED ORDER — IOHEXOL 300 MG/ML  SOLN
100.0000 mL | Freq: Once | INTRAMUSCULAR | Status: AC | PRN
  Administered 2019-01-12: 100 mL via INTRAVENOUS

## 2019-01-12 MED ORDER — METRONIDAZOLE 500 MG PO TABS: 500.0000 mg | ORAL_TABLET | Freq: Three times a day (TID) | ORAL | 0 refills | Status: AC

## 2019-01-12 MED ORDER — SODIUM CHLORIDE 0.9 % IV BOLUS
1000.0000 mL | Freq: Once | INTRAVENOUS | Status: AC
  Administered 2019-01-12: 1000 mL via INTRAVENOUS

## 2019-01-12 MED ORDER — FENTANYL CITRATE (PF) 100 MCG/2ML IJ SOLN
50.0000 ug | Freq: Once | INTRAMUSCULAR | Status: AC
  Administered 2019-01-12: 50 ug via INTRAVENOUS
  Filled 2019-01-12: qty 2

## 2019-01-12 MED ORDER — ONDANSETRON HCL 4 MG/2ML IJ SOLN
4.0000 mg | Freq: Once | INTRAMUSCULAR | Status: AC
  Administered 2019-01-12: 4 mg via INTRAVENOUS
  Filled 2019-01-12: qty 2

## 2019-01-12 MED ORDER — CIPROFLOXACIN HCL 500 MG PO TABS: 500.0000 mg | ORAL_TABLET | Freq: Two times a day (BID) | ORAL | 0 refills | Status: DC

## 2019-01-12 MED ORDER — METRONIDAZOLE 500 MG PO TABS
500.0000 mg | ORAL_TABLET | Freq: Once | ORAL | Status: AC
  Administered 2019-01-12: 500 mg via ORAL
  Filled 2019-01-12: qty 1

## 2019-01-12 NOTE — ED Triage Notes (Signed)
LLQ pain x 3 days. Had colonoscopy with dx of diverticulitis 3 days ago. Reports that her GI MD advised her to come to the ED for a CT scan and antibiotics today. Denies N/V/D

## 2019-01-12 NOTE — Telephone Encounter (Signed)
Patient called, c/o LLQ pain which started yesterday morning, constant ache, not severe, not worse today. No fever, sweats or chills. Pain is some what cramp like. NO BM since  Colonoscopy 7/10. EGD gastritis. Colonoscopy showed sigmoid diverticulosis. I advised patient if her LLQ pain worsens to go to the ED for labs and CT to rule out diverticulitis. She does not yet wish to go to the ED. She will call me in 2 hours, if sx worse to ED, if sx without change will consider prescribing Dicyclomine and facilitate office follow up with Dr. Sharyne Peach or App tomorrow.

## 2019-01-12 NOTE — ED Notes (Signed)
CT awaiting results of upreg and CMP prior to imaging.

## 2019-01-12 NOTE — Discharge Instructions (Signed)
Follow-up with GI 

## 2019-01-12 NOTE — ED Provider Notes (Signed)
[facility] EMERGENCY DEPARTMENT Provider Note   CSN: [id number] Arrival date & time: 01/12/19  1707    History   Chief Complaint Chief Complaint  Patient presents with   Abdominal Pain    HPI Marcia Chan is a 48 y.o. female.     The history is provided by the patient.  Abdominal Pain Pain location:  LLQ Pain quality: aching and cramping   Pain radiates to:  Does not radiate Pain severity:  Mild Onset quality:  Gradual Timing:  Constant Progression:  Unchanged Chronicity:  New Context: previous surgery (Recent colonoscopy, concern for diverticulitis. )   Relieved by:  Nothing Worsened by:  Nothing Associated symptoms: no anorexia, no chest pain, no chills, no constipation, no cough, no dysuria, no fever, no hematuria, no shortness of breath, no sore throat, no vaginal discharge and no vomiting   Risk factors: has not had multiple surgeries and not pregnant     Past Medical History:  Diagnosis Date   GERD (gastroesophageal reflux disease)    Hyperlipidemia    Hypertension    Vertigo     Patient Active Problem List   Diagnosis Date Noted   Primary osteoarthritis of both knees 08/30/2018   Vitamin D deficiency 12/14/2016   Plantar fasciitis of left foot 01/06/2014   Numbness and tingling of left leg 06/09/2013   Lumbar back pain 12/23/2012   Lateral epicondylitis 12/23/2012   GERD (gastroesophageal reflux disease) 05/03/2012   Seasonal allergic rhinitis 10/08/2011   General medical examination 10/06/2011   Cough 06/21/2011   Hair loss 02/21/2011   HTN (hypertension) 02/10/2011   Hyperlipidemia 02/10/2011   Severe obesity (BMI >= 40) (HCC) 02/10/2011   Nail fungus 02/10/2011    Past Surgical History:  Procedure Laterality Date   DILATION AND CURETTAGE OF UTERUS  2002, 2003     OB History   No obstetric history on file.      Home Medications    Prior to Admission medications   Medication Sig Start Date End  Date Taking? Authorizing Provider  carvedilol (COREG) 6.25 MG tablet TAKE 1 TABLET BY MOUTH TWICE DAILY WITH A MEAL 10/28/18   Sheliah Hatchabori, Katherine E, MD  ciprofloxacin (CIPRO) 500 MG tablet Take 1 tablet (500 mg total) by mouth 2 (two) times daily. 01/12/19   Cheo Selvey, DO  clotrimazole-betamethasone (LOTRISONE) cream APPLY EXTERNALLY TO THE AFFECTED AREA TWICE DAILY 01/14/18   Sheliah Hatchabori, Katherine E, MD  levonorgestrel (MIRENA) 20 MCG/24HR IUD 1 each by Intrauterine route once.    [provider]  meclizine (ANTIVERT) 25 MG tablet Take 1 tablet (25 mg total) by mouth 3 (three) times daily as needed for dizziness. 07/02/18   Sheliah Hatchabori, Katherine E, MD  metroNIDAZOLE (FLAGYL) 500 MG tablet Take 1 tablet (500 mg total) by mouth 3 (three) times daily for 10 days. 01/12/19 01/22/19  Calistro Rauf, DO  pantoprazole (PROTONIX) 40 MG tablet Take 1 tablet (40 mg total) by mouth 2 (two) times daily. 12/02/18   Sheliah Hatchabori, Katherine E, MD  pravastatin (PRAVACHOL) 40 MG tablet TAKE 1 TABLET BY MOUTH DAILY 11/01/18   Sheliah Hatchabori, Katherine E, MD  triamterene-hydrochlorothiazide [facility](MAXZIDE-25) 37.5-25 MG tablet TAKE 1 TABLET BY MOUTH DAILY 12/30/18   Sheliah Hatchabori, Katherine E, MD    Family History Family History  Problem Relation Age of Onset   Arthritis Mother        OA/DJD   Heart disease Mother    Diabetes Mother    Hypertension Mother    Cancer  Father    Hypertension Father    Colon cancer Neg Hx     Social History Social History   Tobacco Use   Smoking status: Never Smoker   Smokeless tobacco: Never Used  Substance Use Topics   Alcohol use: No   Drug use: No     Allergies   Bextra [valdecoxib]   Review of Systems Review of Systems  Constitutional: Negative for chills and fever.  HENT: Negative for ear pain and sore throat.   Eyes: Negative for pain and visual disturbance.  Respiratory: Negative for cough and shortness of breath.   Cardiovascular: Negative for chest pain and  palpitations.  Gastrointestinal: Positive for abdominal pain. Negative for anorexia, constipation and vomiting.  Genitourinary: Negative for dysuria, hematuria, vaginal discharge and vaginal pain.  Musculoskeletal: Negative for arthralgias and back pain.  Skin: Negative for color change and rash.  Neurological: Negative for seizures and syncope.  All other systems reviewed and are negative.    Physical Exam Updated Vital Signs  ED Triage Vitals  Enc Vitals Group     BP 01/12/19 1724 (!) 146/96     Pulse Rate 01/12/19 1724 91     Resp 01/12/19 1724 18     Temp 01/12/19 1724 98.7 F (37.1 C)     Temp Source 01/12/19 1724 Oral     SpO2 01/12/19 1724 98 %     Weight 01/12/19 1725 280 lb (127 kg)     Height 01/12/19 1725 5\' 6"  (1.676 m)     Head Circumference --      Peak Flow --      Pain Score 01/12/19 1725 7     Pain Loc --      Pain Edu? --      Excl. in [place]? --     Physical Exam Vitals signs and nursing note reviewed.  Constitutional:      General: She is not in acute distress.    Appearance: She is well-developed.  HENT:     Head: Normocephalic and atraumatic.     Mouth/Throat:     Mouth: Mucous membranes are moist.  Eyes:     Extraocular Movements: Extraocular movements intact.     Conjunctiva/sclera: Conjunctivae normal.     Pupils: Pupils are equal, round, and reactive to light.  Neck:     Musculoskeletal: Neck supple.  Cardiovascular:     Rate and Rhythm: Normal rate and regular rhythm.     Heart sounds: Normal heart sounds. No murmur.  Pulmonary:     Effort: Pulmonary effort is normal. No respiratory distress.     Breath sounds: Normal breath sounds.  Abdominal:     General: Abdomen is flat.     Palpations: Abdomen is soft.     Tenderness: There is abdominal tenderness in the left lower quadrant. There is no guarding or rebound. Negative signs include Murphy's sign and Rovsing's sign.  Skin:    General: Skin is warm and dry.     Capillary Refill:  Capillary refill takes less than 2 seconds.  Neurological:     Mental Status: She is alert.      ED Treatments / Results  Labs (all labs ordered are listed, but only abnormal results are displayed) Labs Reviewed  COMPREHENSIVE METABOLIC PANEL - Abnormal; Notable for the following components:      Result Value   Potassium 3.2 (*)    All other components within normal limits  URINALYSIS, ROUTINE W REFLEX MICROSCOPIC - Abnormal; Notable  for the following components:   Hgb urine dipstick MODERATE (*)    All other components within normal limits  URINALYSIS, MICROSCOPIC (REFLEX) - Abnormal; Notable for the following components:   Bacteria, UA RARE (*)    All other components within normal limits  LIPASE, BLOOD  CBC  PREGNANCY, URINE    EKG None  Radiology Ct Abdomen Pelvis W Contrast  Result Date: 01/12/2019 CLINICAL DATA:  History of recent colonoscopy with distension and left lower quadrant pain EXAM: CT ABDOMEN AND PELVIS WITH CONTRAST TECHNIQUE: Multidetector CT imaging of the abdomen and pelvis was performed using the standard protocol following bolus administration of intravenous contrast. CONTRAST:  100mL OMNIPAQUE IOHEXOL 300 MG/ML  SOLN COMPARISON:  None. FINDINGS: Lower chest: No acute abnormality. Hepatobiliary: Mild fatty infiltration of the liver is noted. Gallbladder is within normal limits. Pancreas: Unremarkable. No pancreatic ductal dilatation or surrounding inflammatory changes. Spleen: Normal in size without focal abnormality. Adrenals/Urinary Tract: Adrenal glands are unremarkable. Kidneys are normal, without renal calculi, focal lesion, or hydronephrosis. Bladder is unremarkable. Stomach/Bowel: There are changes consistent with diverticulitis at the junction of the descending and sigmoid colons. A tiny focus of air is noted consistent with an air-filled diverticulum best seen on image number 62 of series 2. The more proximal colon is within normal limits. The appendix  is unremarkable. No small bowel abnormality is seen. The stomach is within normal limits. Vascular/Lymphatic: No significant vascular findings are present. No enlarged abdominal or pelvic lymph nodes. Reproductive: Uterus is well visualized within IUD in place. No adnexal mass is seen. Other: No abdominal wall hernia or abnormality. No abdominopelvic ascites. Musculoskeletal: No acute or significant osseous findings. IMPRESSION: Changes consistent with diverticulitis in the junction of the descending and sigmoid colon. No abscess or perforation is noted. Electronically Signed   By: Alcide CleverMark  Lukens M.D.   On: 01/12/2019 21:37    Procedures Procedures (including critical care time)  Medications Ordered in ED Medications  ciprofloxacin (CIPRO) tablet 500 mg (has no administration in time range)  metroNIDAZOLE (FLAGYL) tablet 500 mg (has no administration in time range)  sodium chloride 0.9 % bolus 1,000 mL (0 mLs Intravenous Stopped 01/12/19 2224)  fentaNYL (SUBLIMAZE) injection 50 mcg (50 mcg Intravenous Given 01/12/19 1951)  ondansetron (ZOFRAN) injection 4 mg (4 mg Intravenous Given 01/12/19 1951)  iohexol (OMNIPAQUE) 300 MG/ML solution 100 mL (100 mLs Intravenous Contrast Given 01/12/19 2114)     Initial Impression / Assessment and Plan / ED Course  I have reviewed the triage vital signs and the nursing notes.  Pertinent labs & imaging results that were available during my care of the patient were reviewed by me and considered in my medical decision making (see chart for details).     Marcia Chan is a 48 year old female history of high cholesterol, hypertension, reflux who presents to the ED with left lower quadrant abdominal pain after colonoscopy several days ago.  Patient with normal vitals.  No fever.  Patient was told to come to get a CT scan to evaluate for diverticulitis, possible injury from colonoscopy.  Overall patient appears well.  Has some tenderness in the left lower quadrant.   Denies any urinary symptoms.  Denies any vaginal discharge.  Will get lab work to rule out diverticulitis, perforation.  Patient to get IV fluids, IV pain medicine, IV Zofran.  No history of kidney stones.  CT scan shows diverticulitis.  However no abscess or perforation.  No significant leukocytosis, anemia, electrolyte abnormality.  Will  treat with antibiotics.  We will have her follow-up with GI.  Given prescription for Zofran as well.  Understands return precautions.  This chart was dictated using voice recognition software.  Despite best efforts to proofread,  errors can occur which can change the documentation meaning.    Final Clinical Impressions(s) / ED Diagnoses   Final diagnoses:  Diverticulitis    ED Discharge Orders         Ordered    metroNIDAZOLE (FLAGYL) 500 MG tablet  3 times daily     01/12/19 2227    ciprofloxacin (CIPRO) 500 MG tablet  2 times daily     01/12/19 2227           Virgina NorfolkCuratolo, Hau Sanor, DO 01/12/19 2228

## 2019-01-12 NOTE — Telephone Encounter (Signed)
Patient called answering service stating she continues to have abd pain, will go to [facility] ED for further evaluation. See phone note earlier today.

## 2019-01-14 ENCOUNTER — Telehealth: Payer: Self-pay | Admitting: Gastroenterology

## 2019-01-14 ENCOUNTER — Telehealth: Payer: Self-pay

## 2019-01-14 NOTE — Telephone Encounter (Signed)
  Follow up Call-  Call back number 01/10/2019  Post procedure Call Back phone  # [phone]  Permission to leave phone message Yes  Some recent data might be hidden     Patient questions:  Do you have a fever, pain , or abdominal swelling? Yes.   Pain Score  4 *  Have you tolerated food without any problems? Yes.    Have you been able to return to your normal activities? No.  Do you have any questions about your discharge instructions: Diet   No. Medications  No. Follow up visit  No.  Do you have questions or concerns about your Care? No.  Actions: * If pain score is 4 or above: No action needed, pain <4.  Pt had Left sided abdominal pain Saturday night.  She called the call  Sunday and spoke with a NP who advised pt to go to ED.  Pt had CT which she said showed inflamed diverticulum and was given Fentanyl for pain in ED and Flagyl and Cipro.  Pt did not get filled till Monday and started antibiotics and taking Tylenol.  She wants to know how long she will have this abdominal pain.  Please advise.   1. Have you developed a fever since your procedure? no  2.   Have you had an respiratory symptoms (SOB or cough) since your procedure? no  3.   Have you tested positive for COVID 19 since your procedure no  4.   Have you had any family members/close contacts diagnosed with the COVID 19 since your procedure?  no   If yes to any of these questions please route to Joylene John, RN and Alphonsa Gin, Therapist, sports.

## 2019-01-14 NOTE — Telephone Encounter (Signed)
Pt stated that she spoke to Dr. Bryan Lemma this morning and was expecting a pain med to be sent to her pharmacy.

## 2019-01-14 NOTE — Telephone Encounter (Signed)
Marcia Chan, I dont know why [name] forwarded this to me? Let me know if I need to take care of any of this for the patient

## 2019-01-14 NOTE — Telephone Encounter (Signed)
Spoke with patient today. She went to the ER on 7/12 which was n/f normal labs but CT with e/o diverticulitis. Was sent out with Flagyl/Cipro, which she started yesterday. Will still have intermittent LLQ pain. Has maintained lquid and bland diet. No fever, chills. No hematochezia or melena.   - Take Abx as prescribed until complete - Will give Rx for Symax 0.125 mg to take every 4-6 hours as needed for abdominal pain. #30. RF0 -Please send her a work note outlining reasoning for her ongoing absence from work, to include having procedure completed on 7/10, ER evaluation on 7/12, and continues to be under my care through this week. Can expect missed work for the next 7-10 days while completing her prescribed treatment.  - To call if any issues/concerns.  She was very appreciative for the phone call.

## 2019-01-15 ENCOUNTER — Telehealth: Payer: Self-pay | Admitting: Gastroenterology

## 2019-01-15 MED ORDER — HYOSCYAMINE SULFATE 0.125 MG SL SUBL: 0.1250 mg | SUBLINGUAL_TABLET | SUBLINGUAL | 0 refills | Status: DC | PRN

## 2019-01-15 NOTE — Telephone Encounter (Signed)
Patient thanked you for clearing that up and states she really appreciates all the care you have provided. She is feeling better this evening.

## 2019-01-15 NOTE — Telephone Encounter (Signed)
Medication sent to pharmacy and patients husband coming to [facility] office to pick up letter.

## 2019-01-15 NOTE — Telephone Encounter (Signed)
I spoke to patient earlier today she claimed that she woke up feeling better, but still nervous to eat in fear of pain. She claims she is still weak and wants to know how long she needs to wait before she begins introducing solid foods back into her diet?

## 2019-01-15 NOTE — Telephone Encounter (Signed)
Notes reviewed.  Heather, Can you please call Ms. Armor to check in and see if her pain is improving on the Abx and Symax prn. Thanks.

## 2019-01-15 NOTE — Telephone Encounter (Signed)
There is no absolute dietary restriction needed. I discussed with her yesterday that I agree with starting with clears and a bland/BRAT diet, then continue to advance as tolerated. Would refrain from fast food or other greasy, fried food for the time being as well.

## 2019-01-15 NOTE — Telephone Encounter (Signed)
Patient called last night to request pain, she had called earlier during the day and had requested pain medication, was sent to pharmacy.  Advised patient to come into ER if pain is getting to worse for evaluation, she refused.

## 2019-01-15 NOTE — Telephone Encounter (Signed)
Pt requested a doctor's note for work for her procedure.  Please call her back to update.

## 2019-01-16 ENCOUNTER — Other Ambulatory Visit: Payer: Self-pay | Admitting: Family Medicine

## 2019-01-16 ENCOUNTER — Encounter: Payer: Self-pay | Admitting: Gastroenterology

## 2019-01-16 ENCOUNTER — Other Ambulatory Visit: Payer: Self-pay | Admitting: General Practice

## 2019-01-16 MED ORDER — TRIAMTERENE-HCTZ 37.5-25 MG PO TABS: 1.0000 | ORAL_TABLET | Freq: Every day | ORAL | 0 refills | Status: DC

## 2019-01-17 ENCOUNTER — Encounter: Payer: BC Managed Care – PPO | Admitting: Gastroenterology

## 2019-02-12 ENCOUNTER — Encounter: Payer: BC Managed Care – PPO | Admitting: Family Medicine

## 2019-02-17 ENCOUNTER — Ambulatory Visit: Payer: BC Managed Care – PPO | Admitting: Family Medicine

## 2019-02-17 ENCOUNTER — Encounter: Payer: Self-pay | Admitting: Family Medicine

## 2019-02-17 ENCOUNTER — Other Ambulatory Visit: Payer: Self-pay

## 2019-02-17 VITALS — BP 118/82 | HR 86 | Temp 96.0°F | Resp 18 | Ht 66.0 in | Wt 289.0 lb

## 2019-02-17 DIAGNOSIS — M791 Myalgia, unspecified site: Secondary | ICD-10-CM

## 2019-02-17 DIAGNOSIS — R079 Chest pain, unspecified: Secondary | ICD-10-CM | POA: Diagnosis not present

## 2019-02-17 MED ORDER — METHOCARBAMOL 500 MG PO TABS: 500.0000 mg | ORAL_TABLET | Freq: Three times a day (TID) | ORAL | 0 refills | Status: DC | PRN

## 2019-02-17 NOTE — Progress Notes (Signed)
[facility] [address], [address] [place], [place] [address] [phone] [phone]  Date:  02/17/2019   Name:  Marcia Chan   DOB:  12/05/1970   MRN:  [id number]  PCP:  Sheliah Hatchabori, Katherine E, MD    Chief Complaint: Pulled Muscle (started up chest shifts when moving to upper back, only when moving, started yesterday)   History of Present Illness:  Marcia Chan is a 48 y.o. very pleasant female patient who presents with the following:  Pt with history of HTN, hyperlipidemia, Pt of Dr. 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  Did not seem like a big deal, but the next day and today she is quite sore in her chest muscles She has pain across her chest when she moves her trunk  She also gets some pain in her right shoulder when she moves a certain way  She does have GERD  She tried some tylenol last night- she went to sleep, the pain was back this am No SOB No chest pressure or heaviness, sx are not exertional  No history of CAD She does not smoke No estrogen No calf pain or cords   Patient Active Problem List   Diagnosis Date Noted  . Primary osteoarthritis of both knees 08/30/2018  . Vitamin D deficiency 12/14/2016  . Plantar fasciitis of left foot 01/06/2014  . Numbness and tingling of left leg 06/09/2013  . Lumbar back pain 12/23/2012  . Lateral epicondylitis 12/23/2012  . GERD (gastroesophageal reflux disease) 05/03/2012  . Seasonal allergic rhinitis 10/08/2011  . General medical examination 10/06/2011  . Cough 06/21/2011  . Hair loss 02/21/2011  . HTN (hypertension) 02/10/2011  . Hyperlipidemia 02/10/2011  . Severe obesity (BMI >= 40) (HCC) 02/10/2011  . Nail fungus 02/10/2011    Past Medical History:  Diagnosis Date  . GERD (gastroesophageal reflux disease)   .  Hyperlipidemia   . Hypertension   . Vertigo     Past Surgical History:  Procedure Laterality Date  . DILATION AND CURETTAGE OF UTERUS  2002, 2003    Social History   Tobacco Use  . Smoking status: Never Smoker  . Smokeless tobacco: Never Used  Substance Use Topics  . Alcohol use: No  . Drug use: No    Family History  Problem Relation Age of Onset  . Arthritis Mother        OA/DJD  . Heart disease Mother   . Diabetes Mother   . Hypertension Mother   . Cancer Father   . Hypertension Father   . Colon cancer Neg Hx     Allergies  Allergen Reactions  . Bextra [Valdecoxib]     Rash     Medication list has been reviewed and updated.  Current Outpatient Medications on File Prior to Visit  Medication Sig Dispense Refill  . carvedilol (COREG) 6.25 MG tablet TAKE 1 TABLET BY MOUTH TWICE DAILY WITH A MEAL 180 tablet 0  . ciprofloxacin (CIPRO) 500 MG tablet Take 1 tablet (500 mg total) by mouth 2 (two) times daily. 28 tablet 0  . clotrimazole-betamethasone (LOTRISONE) cream APPLY EXTERNALLY TO THE AFFECTED AREA TWICE DAILY 30 g 0  . hyoscyamine (LEVSIN SL) 0.125 MG SL tablet Place 1 tablet (0.125 mg total) under the tongue every 4 (four) hours as  needed. 30 tablet 0  . levonorgestrel (MIRENA) 20 MCG/24HR IUD 1 each by Intrauterine route once.    . meclizine (ANTIVERT) 25 MG tablet Take 1 tablet (25 mg total) by mouth 3 (three) times daily as needed for dizziness. 30 tablet 0  . pantoprazole (PROTONIX) 40 MG tablet Take 1 tablet (40 mg total) by mouth 2 (two) times daily. 180 tablet 0  . pravastatin (PRAVACHOL) 40 MG tablet TAKE 1 TABLET BY MOUTH DAILY 90 tablet 1  . triamterene-hydrochlorothiazide (MAXZIDE-25) 37.5-25 MG tablet Take 1 tablet by mouth daily. 90 tablet 0   Current Facility-Administered Medications on File Prior to Visit  Medication Dose Route Frequency Provider Last Rate Last Dose  . 0.9 %  sodium chloride infusion  500 mL Intravenous Once Cirigliano, Vito V,  DO        Review of Systems:  No history of blood clot Does not smoke   Physical Examination: Vitals:   02/17/19 1354  BP: 118/82  Pulse: 86  Resp: 18  Temp: (!) 96 F (35.6 C)  SpO2: 99%   Vitals:   02/17/19 1354  Weight: 289 lb (131.1 kg)  Height: 5\' 6"  (1.676 m)   Body mass index is 46.65 kg/m. Ideal Body Weight: Weight in (lb) to have BMI = 25: 154.6  GEN: WDWN, NAD, Non-toxic, A & O x 3, obese, looks well  HEENT: Atraumatic, Normocephalic. Neck supple. No masses, No LAD. Ears and Nose: No external deformity. CV: RRR, No M/G/R. No JVD. No thrill. No extra heart sounds. PULM: CTA B, no wheezes, crackles, rhonchi. No retractions. No resp. distress. No accessory muscle use. ABD: S, NT, ND, +BS. No rebound. No HSM. EXTR: No c/c/e, no calf tenderness or cords  NEURO Normal gait.  PSYCH: Normally interactive. Conversant. Not depressed or anxious appearing.  Calm demeanor.  She is tender to palpation across her pectoralis muscles. Worse on the right Right shoulder with normal ROM  No redness or skin change   EKG: SR, compared with tracing from 2018 no acute change   Assessment and Plan:   ICD-10-CM   1. Muscle pain  M79.10 methocarbamol (ROBAXIN) 500 MG tablet  2. Chest pain, unspecified type  R07.9 EKG 12-Lead   Here today with likely MSK chest pain due to moving items at home which she is not used to Pain is easily reproducible with palpation, and present with moving her chest wall Not exertional EKG is normal today rx for robaxin to use prn, cautioned that this may cause drowsiness Discussed distant possibility of PE (pt wondered about this), offered to do a D dime She declines for now, will seek care if any worsening or if not better soon   Follow-up: No follow-ups on file.  Meds ordered this encounter  Medications  . methocarbamol (ROBAXIN) 500 MG tablet    Sig: Take 1-2 tablets (500-1,000 mg total) by mouth every 8 (eight) hours as needed for muscle  spasms.    Dispense:  40 tablet    Refill:  0   Orders Placed This Encounter  Procedures  . EKG 12-Lead    @SIGN @    Signed Lamar Blinks, MD

## 2019-02-17 NOTE — Patient Instructions (Signed)
Great to see you today- please try the robaxin muscle relaxer but let me know if you are not feeling better in the next couple of days Ok to continue OTC pain relievers as well, a muscle rub and heat may be helpful If your symptoms continue please let me know and we can look further Of course if you develop any significant SOB or other acute concerns go to the ER

## 2019-02-20 ENCOUNTER — Other Ambulatory Visit: Payer: Self-pay | Admitting: Family Medicine

## 2019-04-08 ENCOUNTER — Telehealth: Payer: Self-pay | Admitting: Family Medicine

## 2019-04-08 MED ORDER — PANTOPRAZOLE SODIUM 40 MG PO TBEC: 40.0000 mg | DELAYED_RELEASE_TABLET | Freq: Two times a day (BID) | ORAL | 0 refills | Status: DC

## 2019-04-08 NOTE — Telephone Encounter (Signed)
Patient is calling in to get a refill on her pantoprazole (PROTONIX) 40 MG tablet and would like it refilled at  [place], [place] (Phone) [phone] (Fax)

## 2019-04-08 NOTE — Telephone Encounter (Signed)
Medication filled to pharmacy as requested.   

## 2019-04-08 NOTE — Addendum Note (Signed)
Addended by: Desmond Dike L on: 04/08/2019 11:10 AM   Modules accepted: Orders

## 2019-04-25 ENCOUNTER — Other Ambulatory Visit: Payer: Self-pay | Admitting: General Practice

## 2019-04-25 MED ORDER — CARVEDILOL 6.25 MG PO TABS: ORAL_TABLET | ORAL | 0 refills | Status: DC

## 2019-04-25 MED ORDER — TRIAMTERENE-HCTZ 37.5-25 MG PO TABS: 1.0000 | ORAL_TABLET | Freq: Every day | ORAL | 0 refills | Status: DC

## 2019-05-01 ENCOUNTER — Encounter: Payer: Self-pay | Admitting: Family Medicine

## 2019-05-01 ENCOUNTER — Ambulatory Visit (INDEPENDENT_AMBULATORY_CARE_PROVIDER_SITE_OTHER): Payer: BC Managed Care – PPO | Admitting: Family Medicine

## 2019-05-01 ENCOUNTER — Other Ambulatory Visit: Payer: Self-pay

## 2019-05-01 VITALS — BP 130/90 | HR 69 | Temp 97.8°F | Resp 17 | Ht 66.0 in | Wt 289.5 lb

## 2019-05-01 DIAGNOSIS — Z23 Encounter for immunization: Secondary | ICD-10-CM

## 2019-05-01 DIAGNOSIS — K219 Gastro-esophageal reflux disease without esophagitis: Secondary | ICD-10-CM

## 2019-05-01 DIAGNOSIS — E559 Vitamin D deficiency, unspecified: Secondary | ICD-10-CM | POA: Diagnosis not present

## 2019-05-01 DIAGNOSIS — Z0000 Encounter for general adult medical examination without abnormal findings: Secondary | ICD-10-CM

## 2019-05-01 DIAGNOSIS — F419 Anxiety disorder, unspecified: Secondary | ICD-10-CM

## 2019-05-01 HISTORY — DX: Anxiety disorder, unspecified: F41.9

## 2019-05-01 LAB — CBC WITH DIFFERENTIAL/PLATELET
Basophils Absolute: 0.1 10*3/uL (ref 0.0–0.1)
Basophils Relative: 0.8 % (ref 0.0–3.0)
Eosinophils Absolute: 0.1 10*3/uL (ref 0.0–0.7)
Eosinophils Relative: 1.2 % (ref 0.0–5.0)
HCT: 38.7 % (ref 36.0–46.0)
Hemoglobin: 13 g/dL (ref 12.0–15.0)
Lymphocytes Relative: 20.7 % (ref 12.0–46.0)
Lymphs Abs: 1.5 10*3/uL (ref 0.7–4.0)
MCHC: 33.7 g/dL (ref 30.0–36.0)
MCV: 89.3 fl (ref 78.0–100.0)
Monocytes Absolute: 0.7 10*3/uL (ref 0.1–1.0)
Monocytes Relative: 9.5 % (ref 3.0–12.0)
Neutro Abs: 4.8 10*3/uL (ref 1.4–7.7)
Neutrophils Relative %: 67.8 % (ref 43.0–77.0)
Platelets: 253 10*3/uL (ref 150.0–400.0)
RBC: 4.33 Mil/uL (ref 3.87–5.11)
RDW: 12.8 % (ref 11.5–15.5)
WBC: 7.1 10*3/uL (ref 4.0–10.5)

## 2019-05-01 LAB — LIPID PANEL
Cholesterol: 172 mg/dL (ref 0–200)
HDL: 37.2 mg/dL — ABNORMAL LOW (ref >39.00)
LDL Cholesterol: 122 mg/dL — ABNORMAL HIGH (ref 0–99)
NonHDL: 134.66
Total CHOL/HDL Ratio: 5
Triglycerides: 63 mg/dL (ref 0.0–149.0)
VLDL: 12.6 mg/dL (ref 0.0–40.0)

## 2019-05-01 LAB — HEPATIC FUNCTION PANEL
ALT: 16 U/L (ref 0–35)
AST: 14 U/L (ref 0–37)
Albumin: 4.1 g/dL (ref 3.5–5.2)
Alkaline Phosphatase: 56 U/L (ref 39–117)
Bilirubin, Direct: 0.1 mg/dL (ref 0.0–0.3)
Total Bilirubin: 0.7 mg/dL (ref 0.2–1.2)
Total Protein: 7.1 g/dL (ref 6.0–8.3)

## 2019-05-01 LAB — BASIC METABOLIC PANEL
BUN: 12 mg/dL (ref 6–23)
CO2: 31 mEq/L (ref 19–32)
Calcium: 9.2 mg/dL (ref 8.4–10.5)
Chloride: 101 mEq/L (ref 96–112)
Creatinine, Ser: 0.75 mg/dL (ref 0.40–1.20)
GFR: 99.28 mL/min (ref >60.00)
Glucose, Bld: 109 mg/dL — ABNORMAL HIGH (ref 70–99)
Potassium: 4 mEq/L (ref 3.5–5.1)
Sodium: 138 mEq/L (ref 135–145)

## 2019-05-01 LAB — HEMOGLOBIN A1C: Hgb A1c MFr Bld: 5.8 % (ref 4.6–6.5)

## 2019-05-01 MED ORDER — SERTRALINE HCL 25 MG PO TABS: 25.0000 mg | ORAL_TABLET | Freq: Every day | ORAL | 3 refills | Status: DC

## 2019-05-01 MED ORDER — PRAVASTATIN SODIUM 40 MG PO TABS: 40.0000 mg | ORAL_TABLET | Freq: Every day | ORAL | 1 refills | Status: DC

## 2019-05-01 NOTE — Assessment & Plan Note (Signed)
Pt has hx of Vit D deficiency.  Check labs and replete prn. 

## 2019-05-01 NOTE — Assessment & Plan Note (Signed)
Ongoing issue for pt.  Stressed need for healthy diet and regular exercise.  Check labs to risk stratify.  Will follow 

## 2019-05-01 NOTE — Assessment & Plan Note (Signed)
New.  Pt's sxs are severe at this time.  We spent ~20 minutes discussing the physical and emotional toll of her current anxiety level.  Will start low dose SSRI and monitor closely.  Work note was provided.

## 2019-05-01 NOTE — Progress Notes (Signed)
   Subjective:    Patient ID: Marcia Chan, female    DOB: 03-16-1971, 48 y.o.   MRN: [id number]  HPI CPE- UTD on pap, mammo, colonoscopy, Tdap.  Due for flu.   Review of Systems Patient reports no vision/ hearing changes, adenopathy,fever, weight change,  persistant/recurrent hoarseness , swallowing issues, chest pain, palpitations, edema, persistant/recurrent cough, hemoptysis, dyspnea (rest/exertional/paroxysmal nocturnal), gastrointestinal bleeding (melena, rectal bleeding), bowel changes, GU symptoms (dysuria, hematuria, incontinence), Gyn symptoms (abnormal  bleeding, pain),  syncope, focal weakness, memory loss, numbness & tingling, skin/hair/nail changes, abnormal bruising or bleeding.   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  She is worried based on race, HTN, and obesity.  She also cares for elderly mother w/ multiple medical conditions.  She is afraid that school will not honor her wish to work remotely.  This has her very stressed out. + GERD- pt reports she had colonoscopy/endoscopy but never received any f/u.  Is still having issues and would like a 2nd opinion. + abd pain- had severe diverticulitis this summer    Objective:   Physical Exam General Appearance:    Alert, cooperative, no distress, appears stated age  Head:    Normocephalic, without obvious abnormality, atraumatic  Eyes:    PERRL, conjunctiva/corneas clear, EOM's intact, fundi    benign, both eyes  Ears:    Normal TM's and external ear canals, both ears  Nose:   Deferred due to COVID  Throat:   Neck:   Supple, symmetrical, trachea midline, no adenopathy;    Thyroid: no enlargement/tenderness/nodules  Back:     Symmetric, no curvature, ROM normal, no CVA tenderness  Lungs:     Clear to auscultation bilaterally, respirations unlabored  Chest Wall:    No tenderness or  deformity   Heart:    Regular rate and rhythm, S1 and S2 normal, no murmur, rub   or gallop  Breast Exam:    Deferred to mammo  Abdomen:     Soft, non-tender, bowel sounds active all four quadrants,    no masses, no organomegaly  Genitalia:    Deferred to GYN  Rectal:    Extremities:   Extremities normal, atraumatic, no cyanosis or edema  Pulses:   2+ and symmetric all extremities  Skin:   Skin color, texture, turgor normal, no rashes or lesions  Lymph nodes:   Cervical, supraclavicular, and axillary nodes normal  Neurologic:   CNII-XII intact, normal strength, sensation and reflexes    throughout          Assessment & Plan:

## 2019-05-01 NOTE — Patient Instructions (Addendum)
Follow up in 1 month to recheck anxiety We'll notify you of your lab results and make any changes if needed START the Sertraline once daily We'll call you with your GI referral Continue to work on healthy diet and regular exercise- you can do it! Call with any questions or concerns Stay Safe!  Stay Healthy!

## 2019-05-01 NOTE — Assessment & Plan Note (Signed)
Ongoing issue.  No relief w/ PPI.  Had endoscopy but never had f/u to discuss next steps.  She is interested in switching providers for a 2nd opinion as she feels that she was never able to discuss her sxs or concerns.  Referral placed.

## 2019-05-01 NOTE — Assessment & Plan Note (Signed)
Pt's PE WNL w/ exception of obesity.  UTD on pap, mammo, colonoscopy, Tdap.  Flu shot given.  Check labs.  Anticipatory guidance provided.

## 2019-05-02 ENCOUNTER — Other Ambulatory Visit: Payer: Self-pay

## 2019-05-02 LAB — VITAMIN D 25 HYDROXY (VIT D DEFICIENCY, FRACTURES): VITD: 24.88 ng/mL — ABNORMAL LOW (ref 30.00–100.00)

## 2019-05-02 LAB — TSH: TSH: 1.11 u[IU]/mL (ref 0.35–4.50)

## 2019-05-02 MED ORDER — VITAMIN D (ERGOCALCIFEROL) 1.25 MG (50000 UNIT) PO CAPS: 50000.0000 [IU] | ORAL_CAPSULE | ORAL | 4 refills | Status: DC

## 2019-06-02 ENCOUNTER — Encounter: Payer: Self-pay | Admitting: Family Medicine

## 2019-06-02 ENCOUNTER — Other Ambulatory Visit: Payer: Self-pay

## 2019-06-02 ENCOUNTER — Ambulatory Visit (INDEPENDENT_AMBULATORY_CARE_PROVIDER_SITE_OTHER): Payer: BC Managed Care – PPO | Admitting: Family Medicine

## 2019-06-02 DIAGNOSIS — F419 Anxiety disorder, unspecified: Secondary | ICD-10-CM

## 2019-06-02 NOTE — Progress Notes (Signed)
I have discussed the procedure for the virtual visit with the patient who has given consent to proceed with assessment and treatment.   Pt unable to obtain vitals.   Jennife Zaucha L Karlyn Glasco, CMA     

## 2019-06-02 NOTE — Progress Notes (Signed)
Virtual Visit via Video   I connected with patient on 06/02/19 at 11:30 AM EST by a video enabled telemedicine application and verified that I am speaking with the correct person using two identifiers.  Location patient: Home Location provider: [facility], Office Persons participating in the virtual visit: Patient, Provider, CMA (Jess B)  I discussed the limitations of evaluation and management by telemedicine and the availability of in person appointments. The patient expressed understanding and agreed to proceed.  Subjective:   HPI:   Anxiety/Depression- pt was started on Sertraline at last visit.  'I took the pills for a couple days but my anxiety went through the roof.  I was worried about being addicted to pills'.  Stopped the medication and started working on stress management to improve her sxs.  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  Stopped watching the news- which has helped.  Having COVID nightmares.  'I'm a control person and this is way beyond my control'.  'I need to be convinced to take the medication'.  Pt is tearful.  Caring for mom, husband w/ multiple medical issues.  48 yr old is not following COVID protocols, 49 yr old has asthma and she's worried about his lungs.  ROS:   See pertinent positives and negatives per HPI.  Patient Active Problem List   Diagnosis Date Noted  . Anxiety 05/01/2019  . Primary osteoarthritis of both knees 08/30/2018  . Vitamin D deficiency 12/14/2016  . Plantar fasciitis of left foot 01/06/2014  . Numbness and tingling of left leg 06/09/2013  . Lumbar back pain 12/23/2012  . Lateral epicondylitis 12/23/2012  . GERD (gastroesophageal reflux disease) 05/03/2012  . Seasonal allergic rhinitis 10/08/2011  . General medical examination 10/06/2011  . Cough 06/21/2011  . Hair loss 02/21/2011  . HTN (hypertension) 02/10/2011  . Hyperlipidemia 02/10/2011  . Severe obesity (BMI >= 40)  (HCC) 02/10/2011  . Nail fungus 02/10/2011    Social History   Tobacco Use  . Smoking status: Never Smoker  . Smokeless tobacco: Never Used  Substance Use Topics  . Alcohol use: No    Current Outpatient Medications:  .  carvedilol (COREG) 6.25 MG tablet, TAKE 1 TABLET BY MOUTH TWICE DAILY WITH A MEAL, Disp: 180 tablet, Rfl: 0 .  hyoscyamine (LEVSIN SL) 0.125 MG SL tablet, Place 1 tablet (0.125 mg total) under the tongue every 4 (four) hours as needed., Disp: 30 tablet, Rfl: 0 .  levonorgestrel (MIRENA) 20 MCG/24HR IUD, 1 each by Intrauterine route once., Disp: , Rfl:  .  meclizine (ANTIVERT) 25 MG tablet, Take 1 tablet (25 mg total) by mouth 3 (three) times daily as needed for dizziness., Disp: 30 tablet, Rfl: 0 .  methocarbamol (ROBAXIN) 500 MG tablet, Take 1-2 tablets (500-1,000 mg total) by mouth every 8 (eight) hours as needed for muscle spasms., Disp: 40 tablet, Rfl: 0 .  pantoprazole (PROTONIX) 40 MG tablet, Take 1 tablet (40 mg total) by mouth 2 (two) times daily., Disp: 180 tablet, Rfl: 0 .  pravastatin (PRAVACHOL) 40 MG tablet, Take 1 tablet (40 mg total) by mouth daily., Disp: 90 tablet, Rfl: 1 .  sertraline (ZOLOFT) 25 MG tablet, Take 1 tablet (25 mg total) by mouth daily., Disp: 30 tablet, Rfl: 3 .  triamterene-hydrochlorothiazide (MAXZIDE-25) 37.5-25 MG tablet, Take 1 tablet by mouth daily., Disp: 90 tablet, Rfl: 0 .  Vitamin D, Ergocalciferol, (DRISDOL) 1.25 MG (50000 UT) CAPS capsule, Take  1 capsule (50,000 Units total) by mouth every 7 (seven) days. Take 1 capsule (50,000 units total) by mouth every 7 days for 12 weeks., Disp: 4 capsule, Rfl: 4 .  clotrimazole-betamethasone (LOTRISONE) cream, APPLY EXTERNALLY TO THE AFFECTED AREA TWICE DAILY (Patient not taking: Reported on 05/01/2019), Disp: 30 g, Rfl: 0  Allergies  Allergen Reactions  . Bextra [Valdecoxib]     Rash     Objective:   There were no vitals taken for this visit. AAOx3, NAD NCAT, EOMI No obvious CN  deficits Coloring WNL Pt is able to speak clearly, coherently without shortness of breath or increased work of breathing.  Thought process is linear.  Mood is appropriate.   Assessment and Plan:   Anxiety- deteriorated.  Pt is now having nightmares about COVID.  She knows many who have died and she is really struggling w/ the idea that she may be the one to bring something home to her family.  She did not take the Sertraline b/c she feared being addicted to pills.  Discussed that these pills are not addictive and have the potential to be very beneficial in helping to improve her mood and anxiety.  Encouraged her to practice self care and to relax when she is able.  Will restart medication and follow closely.  Total time spent w/ pt- 30 minutes, >50% spent counseling   Annye Asa, MD 06/02/2019

## 2019-06-30 ENCOUNTER — Other Ambulatory Visit: Payer: Self-pay

## 2019-06-30 MED ORDER — PANTOPRAZOLE SODIUM 40 MG PO TBEC: 40.0000 mg | DELAYED_RELEASE_TABLET | Freq: Two times a day (BID) | ORAL | 0 refills | Status: DC

## 2019-07-01 ENCOUNTER — Telehealth: Payer: Self-pay

## 2019-07-01 NOTE — Telephone Encounter (Signed)
Pt wanted to know what type of OTC items could be used to help boost immune systems.

## 2019-07-01 NOTE — Telephone Encounter (Signed)
Patient is requesting a from [facility] regarding some questions that she has. Would not provide anymore information.

## 2019-07-02 NOTE — Telephone Encounter (Signed)
Pt made aware

## 2019-07-02 NOTE — Telephone Encounter (Signed)
Airborne (combination product), Zinc, Vit C, Elderberry

## 2019-07-03 ENCOUNTER — Ambulatory Visit: Payer: BC Managed Care – PPO | Attending: Internal Medicine

## 2019-07-03 DIAGNOSIS — Z20822 Contact with and (suspected) exposure to covid-19: Secondary | ICD-10-CM

## 2019-07-05 LAB — NOVEL CORONAVIRUS, NAA: SARS-CoV-2, NAA: NOT DETECTED

## 2019-07-07 ENCOUNTER — Other Ambulatory Visit: Payer: Self-pay

## 2019-07-07 ENCOUNTER — Encounter: Payer: Self-pay | Admitting: Family Medicine

## 2019-07-07 ENCOUNTER — Ambulatory Visit (INDEPENDENT_AMBULATORY_CARE_PROVIDER_SITE_OTHER): Payer: BC Managed Care – PPO | Admitting: Family Medicine

## 2019-07-07 DIAGNOSIS — F419 Anxiety disorder, unspecified: Secondary | ICD-10-CM

## 2019-07-07 NOTE — Assessment & Plan Note (Signed)
Improved.  Pt has had to confront her biggest fear (COVID) and is handling the situation very well.  Medication has not changed her mood and has caused daily headaches.  At this time, she doesn't appear to need a different medication.  We will d/c Sertraline at this time and continue to monitor going forward.  Pt expressed understanding and is in agreement w/ plan.

## 2019-07-07 NOTE — Progress Notes (Signed)
I have discussed the procedure for the virtual visit with the patient who has given consent to proceed with assessment and treatment.   Pt unable to obtain vitals.   Medication for anxiety gives her a massive headache after an hour. Takes medication with food.   Geannie Risen, CMA

## 2019-07-07 NOTE — Progress Notes (Signed)
Virtual Visit via Video   I connected with patient on 07/07/19 at  2:30 PM EST by a video enabled telemedicine application and verified that I am speaking with the correct person using two identifiers.  Location patient: Home Location provider: Acupuncturist, Office Persons participating in the virtual visit: Patient, Provider, [place] (Jess B)  I discussed the limitations of evaluation and management by telemedicine and the availability of in person appointments. The patient expressed understanding and agreed to proceed.  Subjective:   HPI:   Anxiety- 49 yr old has COVID.  sxs started late Monday night and she had rapid test Tuesday that was +.  Daughter is isolating in her room away from rest of family.  Pt and remainder of family have tested negative.  Pt is proud of how she is handling this as this was a large source of her anxiety.  Reports she will get a bad headache about an hour after taking medication.  Started medication 'over a week ago'.  Hasn't noticed a big difference in anxiety levels.  ROS:   See pertinent positives and negatives per HPI.  Patient Active Problem List   Diagnosis Date Noted  . Anxiety 05/01/2019  . Primary osteoarthritis of both knees 08/30/2018  . Vitamin D deficiency 12/14/2016  . Plantar fasciitis of left foot 01/06/2014  . Numbness and tingling of left leg 06/09/2013  . Lumbar back pain 12/23/2012  . Lateral epicondylitis 12/23/2012  . GERD (gastroesophageal reflux disease) 05/03/2012  . Seasonal allergic rhinitis 10/08/2011  . General medical examination 10/06/2011  . Cough 06/21/2011  . Hair loss 02/21/2011  . HTN (hypertension) 02/10/2011  . Hyperlipidemia 02/10/2011  . Severe obesity (BMI >= 40) ([place]) 02/10/2011  . Nail fungus 02/10/2011    Social History   Tobacco Use  . Smoking status: Never Smoker  . Smokeless tobacco: Never Used  Substance Use Topics  . Alcohol use: No    Current Outpatient Medications:  .  carvedilol  (COREG) 6.25 MG tablet, TAKE 1 TABLET BY MOUTH TWICE DAILY WITH A MEAL, Disp: 180 tablet, Rfl: 0 .  hyoscyamine (LEVSIN SL) 0.125 MG SL tablet, Place 1 tablet (0.125 mg total) under the tongue every 4 (four) hours as needed., Disp: 30 tablet, Rfl: 0 .  levonorgestrel (MIRENA) 20 MCG/24HR IUD, 1 each by Intrauterine route once., Disp: , Rfl:  .  meclizine (ANTIVERT) 25 MG tablet, Take 1 tablet (25 mg total) by mouth 3 (three) times daily as needed for dizziness., Disp: 30 tablet, Rfl: 0 .  medroxyPROGESTERone (PROVERA) 10 MG tablet, Take 10 mg by mouth daily., Disp: , Rfl:  .  methocarbamol (ROBAXIN) 500 MG tablet, Take 1-2 tablets (500-1,000 mg total) by mouth every 8 (eight) hours as needed for muscle spasms., Disp: 40 tablet, Rfl: 0 .  pantoprazole (PROTONIX) 40 MG tablet, Take 1 tablet (40 mg total) by mouth 2 (two) times daily., Disp: 180 tablet, Rfl: 0 .  pravastatin (PRAVACHOL) 40 MG tablet, Take 1 tablet (40 mg total) by mouth daily., Disp: 90 tablet, Rfl: 1 .  sertraline (ZOLOFT) 25 MG tablet, Take 1 tablet (25 mg total) by mouth daily., Disp: 30 tablet, Rfl: 3 .  triamterene-hydrochlorothiazide (MAXZIDE-25) 37.5-25 MG tablet, Take 1 tablet by mouth daily., Disp: 90 tablet, Rfl: 0 .  Vitamin D, Ergocalciferol, (DRISDOL) 1.25 MG (50000 UT) CAPS capsule, Take 1 capsule (50,000 Units total) by mouth every 7 (seven) days. Take 1 capsule (50,000 units total) by mouth every 7 days for 12  weeks., Disp: 4 capsule, Rfl: 4  Allergies  Allergen Reactions  . Bextra [Valdecoxib]     Rash     Objective:   There were no vitals taken for this visit.  AAOx3, NAD NCAT, EOMI No obvious CN deficits Coloring WNL Pt is able to speak clearly, coherently without shortness of breath or increased work of breathing.  Thought process is linear.  Mood is appropriate.   Assessment and Plan:   See problem based charting   Neena Rhymes, MD 07/07/2019

## 2019-07-15 ENCOUNTER — Ambulatory Visit (INDEPENDENT_AMBULATORY_CARE_PROVIDER_SITE_OTHER): Payer: BC Managed Care – PPO | Admitting: Physician Assistant

## 2019-07-15 ENCOUNTER — Other Ambulatory Visit: Payer: Self-pay

## 2019-07-15 ENCOUNTER — Encounter: Payer: Self-pay | Admitting: Physician Assistant

## 2019-07-15 VITALS — Temp 97.6°F

## 2019-07-15 DIAGNOSIS — R252 Cramp and spasm: Secondary | ICD-10-CM | POA: Diagnosis not present

## 2019-07-15 DIAGNOSIS — M791 Myalgia, unspecified site: Secondary | ICD-10-CM

## 2019-07-15 MED ORDER — METHOCARBAMOL 500 MG PO TABS: 500.0000 mg | ORAL_TABLET | Freq: Three times a day (TID) | ORAL | 0 refills | Status: DC | PRN

## 2019-07-15 NOTE — Progress Notes (Signed)
Virtual Visit via Video   I connected with patient on 07/15/19 at  4:00 PM EST by a video enabled telemedicine application and verified that I am speaking with the correct person using two identifiers.  Location patient: Home Location provider: [facility], Office Persons participating in the virtual visit: Patient, Provider, [place] [facility])  I discussed the limitations of evaluation and management by telemedicine and the availability of in person appointments. The patient expressed understanding and agreed to proceed.  Subjective:   HPI:   Patient presents via [facility] today c/o an episode of pain in her R thigh yesterday. Notes she had been sitting at computer desk for 4 hours yesterday evening. Had not been up and walking around. When she tried to get up the R upper thigh "seized up" on her. Eased up and she moved around. Still sore and stiffer compared to other muscle groups today. No other episodes of the full cramp/spasm. Is keeping well-hydrated. No change in diet. Much more active last week compared to this week. Very sedentary.   ROS:   See pertinent positives and negatives per HPI.  Patient Active Problem List   Diagnosis Date Noted  . Anxiety 05/01/2019  . Primary osteoarthritis of both knees 08/30/2018  . Vitamin D deficiency 12/14/2016  . Plantar fasciitis of left foot 01/06/2014  . Numbness and tingling of left leg 06/09/2013  . Lumbar back pain 12/23/2012  . Lateral epicondylitis 12/23/2012  . GERD (gastroesophageal reflux disease) 05/03/2012  . Seasonal allergic rhinitis 10/08/2011  . General medical examination 10/06/2011  . Cough 06/21/2011  . Hair loss 02/21/2011  . HTN (hypertension) 02/10/2011  . Hyperlipidemia 02/10/2011  . Severe obesity (BMI >= 40) ([place]) 02/10/2011  . Nail fungus 02/10/2011    Social History   Tobacco Use  . Smoking status: Never Smoker  . Smokeless tobacco: Never Used  Substance Use Topics  . Alcohol use: No     Current Outpatient Medications:  .  carvedilol (COREG) 6.25 MG tablet, TAKE 1 TABLET BY MOUTH TWICE DAILY WITH A MEAL, Disp: 180 tablet, Rfl: 0 .  hyoscyamine (LEVSIN SL) 0.125 MG SL tablet, Place 1 tablet (0.125 mg total) under the tongue every 4 (four) hours as needed., Disp: 30 tablet, Rfl: 0 .  levonorgestrel (MIRENA) 20 MCG/24HR IUD, 1 each by Intrauterine route once., Disp: , Rfl:  .  meclizine (ANTIVERT) 25 MG tablet, Take 1 tablet (25 mg total) by mouth 3 (three) times daily as needed for dizziness., Disp: 30 tablet, Rfl: 0 .  medroxyPROGESTERone (PROVERA) 10 MG tablet, Take 10 mg by mouth daily., Disp: , Rfl:  .  methocarbamol (ROBAXIN) 500 MG tablet, Take 1-2 tablets (500-1,000 mg total) by mouth every 8 (eight) hours as needed for muscle spasms., Disp: 40 tablet, Rfl: 0 .  pantoprazole (PROTONIX) 40 MG tablet, Take 1 tablet (40 mg total) by mouth 2 (two) times daily., Disp: 180 tablet, Rfl: 0 .  pravastatin (PRAVACHOL) 40 MG tablet, Take 1 tablet (40 mg total) by mouth daily., Disp: 90 tablet, Rfl: 1 .  triamterene-hydrochlorothiazide (MAXZIDE-25) 37.5-25 MG tablet, Take 1 tablet by mouth daily., Disp: 90 tablet, Rfl: 0 .  Vitamin D, Ergocalciferol, (DRISDOL) 1.25 MG (50000 UT) CAPS capsule, Take 1 capsule (50,000 Units total) by mouth every 7 (seven) days. Take 1 capsule (50,000 units total) by mouth every 7 days for 12 weeks. (Patient not taking: Reported on 07/15/2019), Disp: 4 capsule, Rfl: 4  Allergies  Allergen Reactions  . Bextra [Valdecoxib]  Rash     Objective:   There were no vitals taken for this visit.  Patient is well-developed, well-nourished in no acute distress.  Resting comfortably at home.  Head is normocephalic, atraumatic.  No labored breathing.  Speech is clear and coherent with logical content.  Patient is alert and oriented at baseline.   Assessment and Plan:   1. Muscle cramp R thigh. One episode with residual tension. Discussed she needs to  get up and be ambulatory especially if sitting for prolonged periods of time. Needs to get up and walk for 5 minutes for every hour sitting at desk. Heating pad to the area. Can use [facility]. Will refill her Robaxin to use only if another episode occurs. She should be fine to resume normal activities.    Piedad Climes, PA-C 07/15/2019

## 2019-07-15 NOTE — Progress Notes (Signed)
I have discussed the procedure for the virtual visit with the patient who has given consent to proceed with assessment and treatment.   Victoriah Wilds N Makira Holleman, CMA      

## 2019-07-23 ENCOUNTER — Other Ambulatory Visit: Payer: Self-pay

## 2019-07-23 MED ORDER — CARVEDILOL 6.25 MG PO TABS: ORAL_TABLET | ORAL | 0 refills | Status: DC

## 2019-07-23 MED ORDER — TRIAMTERENE-HCTZ 37.5-25 MG PO TABS: 1.0000 | ORAL_TABLET | Freq: Every day | ORAL | 0 refills | Status: DC

## 2019-07-31 LAB — HM PAP SMEAR

## 2019-08-06 ENCOUNTER — Ambulatory Visit: Payer: BC Managed Care – PPO | Attending: Internal Medicine

## 2019-08-06 DIAGNOSIS — Z20822 Contact with and (suspected) exposure to covid-19: Secondary | ICD-10-CM | POA: Insufficient documentation

## 2019-08-07 ENCOUNTER — Other Ambulatory Visit: Payer: BC Managed Care – PPO

## 2019-08-07 LAB — NOVEL CORONAVIRUS, NAA: SARS-CoV-2, NAA: NOT DETECTED

## 2019-08-19 LAB — HM MAMMOGRAPHY

## 2019-08-26 ENCOUNTER — Encounter: Payer: Self-pay | Admitting: General Practice

## 2019-08-27 ENCOUNTER — Ambulatory Visit (INDEPENDENT_AMBULATORY_CARE_PROVIDER_SITE_OTHER): Payer: BC Managed Care – PPO | Admitting: Family Medicine

## 2019-08-27 ENCOUNTER — Other Ambulatory Visit: Payer: Self-pay

## 2019-08-27 ENCOUNTER — Encounter: Payer: Self-pay | Admitting: Family Medicine

## 2019-08-27 DIAGNOSIS — R200 Anesthesia of skin: Secondary | ICD-10-CM

## 2019-08-27 DIAGNOSIS — R002 Palpitations: Secondary | ICD-10-CM

## 2019-08-27 DIAGNOSIS — R631 Polydipsia: Secondary | ICD-10-CM | POA: Diagnosis not present

## 2019-08-27 NOTE — Progress Notes (Signed)
I have discussed the procedure for the virtual visit with the patient who has given consent to proceed with assessment and treatment.   Pt unable to obtain vitals.   Jenica Costilow L Jeziel Hoffmann, CMA     

## 2019-08-27 NOTE — Progress Notes (Signed)
Virtual Visit via Video   I connected with patient on 08/27/19 at  4:00 PM EST by a video enabled telemedicine application and verified that I am speaking with the correct person using two identifiers.  Location patient: Home Location provider: Acupuncturist, Office Persons participating in the virtual visit: Patient, Provider, [place] (Jess B)  I discussed the limitations of evaluation and management by telemedicine and the availability of in person appointments. The patient expressed understanding and agreed to proceed.  Subjective:   HPI:   Palpitations- pt reports that for the last 2 days she has been having palpitations 'on and off all day'.  Pt reports she won't have sxs when she first wakes up but they will start once she's at work.  'it doesn't last long' but it will happen frequently.  Pt reports increased stress the last few days.  Has hx of anxiety.  Son goes back on Monday for the first time and he's very worked up.  Pt is nervous about being in the classroom but also feels pressured to get vaccine.  No change in caffeine intake.  Denies CP, SOB.  Hand numbness- pt reports for last 4-5 days she wakes up w/ bilateral hand numbness, some swelling and tightness.  This improves as day goes on.  Pt sleeps w/ her hands tucked and arms folded.  Increased thirst- pt reports she has had 4 bottles of today and still feels thirsty.  ROS:   See pertinent positives and negatives per HPI.  Patient Active Problem List   Diagnosis Date Noted  . Anxiety 05/01/2019  . Primary osteoarthritis of both knees 08/30/2018  . Vitamin D deficiency 12/14/2016  . Plantar fasciitis of left foot 01/06/2014  . Numbness and tingling of left leg 06/09/2013  . Lumbar back pain 12/23/2012  . Lateral epicondylitis 12/23/2012  . GERD (gastroesophageal reflux disease) 05/03/2012  . Seasonal allergic rhinitis 10/08/2011  . General medical examination 10/06/2011  . Cough 06/21/2011  . Hair loss  02/21/2011  . HTN (hypertension) 02/10/2011  . Hyperlipidemia 02/10/2011  . Severe obesity (BMI >= 40) ([place]) 02/10/2011  . Nail fungus 02/10/2011    Social History   Tobacco Use  . Smoking status: Never Smoker  . Smokeless tobacco: Never Used  Substance Use Topics  . Alcohol use: No    Current Outpatient Medications:  .  carvedilol (COREG) 6.25 MG tablet, TAKE 1 TABLET BY MOUTH TWICE DAILY WITH A MEAL, Disp: 180 tablet, Rfl: 0 .  hyoscyamine (LEVSIN SL) 0.125 MG SL tablet, Place 1 tablet (0.125 mg total) under the tongue every 4 (four) hours as needed., Disp: 30 tablet, Rfl: 0 .  levonorgestrel (MIRENA) 20 MCG/24HR IUD, 1 each by Intrauterine route once., Disp: , Rfl:  .  meclizine (ANTIVERT) 25 MG tablet, Take 1 tablet (25 mg total) by mouth 3 (three) times daily as needed for dizziness., Disp: 30 tablet, Rfl: 0 .  medroxyPROGESTERone (PROVERA) 10 MG tablet, Take 10 mg by mouth daily., Disp: , Rfl:  .  methocarbamol (ROBAXIN) 500 MG tablet, Take 1 tablet (500 mg total) by mouth every 8 (eight) hours as needed for muscle spasms., Disp: 10 tablet, Rfl: 0 .  pantoprazole (PROTONIX) 40 MG tablet, Take 1 tablet (40 mg total) by mouth 2 (two) times daily., Disp: 180 tablet, Rfl: 0 .  pravastatin (PRAVACHOL) 40 MG tablet, Take 1 tablet (40 mg total) by mouth daily., Disp: 90 tablet, Rfl: 1 .  triamterene-hydrochlorothiazide (MAXZIDE-25) 37.5-25 MG tablet, Take 1 tablet  by mouth daily., Disp: 90 tablet, Rfl: 0  Allergies  Allergen Reactions  . Bextra [Valdecoxib]     Rash     Objective:   There were no vitals taken for this visit. AAOx3, NAD NCAT, EOMI No obvious CN deficits Coloring WNL Pt is able to speak clearly, coherently without shortness of breath or increased work of breathing.  Thought process is linear.  Mood is appropriate.   Assessment and Plan:   Palpitations- new.  Suspect this is anxiety related w/ all the increased stress this week w/ return to school and the  vaccine situation (pt feels this is very forced) but will have her come for labs and EKG to r/o underlying causes.  Reviewed supportive care and red flags that should prompt return.  Pt expressed understanding and is in agreement w/ plan.   Hand numbness- new.  Suspect her overnight bilateral hand numbness is due to positioning while she sleeps.  Encouraged her to get OTC wrist splints and wear them at night.  Pt expressed understanding and is in agreement w/ plan.   Polydipsia- new.  Check labs to determine if pt is developing diabetes   Neena Rhymes, MD 08/27/2019

## 2019-08-29 ENCOUNTER — Ambulatory Visit (INDEPENDENT_AMBULATORY_CARE_PROVIDER_SITE_OTHER): Payer: BC Managed Care – PPO | Admitting: *Deleted

## 2019-08-29 ENCOUNTER — Other Ambulatory Visit: Payer: Self-pay

## 2019-08-29 DIAGNOSIS — R002 Palpitations: Secondary | ICD-10-CM | POA: Diagnosis not present

## 2019-08-29 DIAGNOSIS — R200 Anesthesia of skin: Secondary | ICD-10-CM

## 2019-08-29 DIAGNOSIS — R631 Polydipsia: Secondary | ICD-10-CM

## 2019-08-29 LAB — HEPATIC FUNCTION PANEL
ALT: 13 U/L (ref 0–35)
AST: 12 U/L (ref 0–37)
Albumin: 3.7 g/dL (ref 3.5–5.2)
Alkaline Phosphatase: 48 U/L (ref 39–117)
Bilirubin, Direct: 0.1 mg/dL (ref 0.0–0.3)
Total Bilirubin: 0.7 mg/dL (ref 0.2–1.2)
Total Protein: 6.7 g/dL (ref 6.0–8.3)

## 2019-08-29 LAB — CBC WITH DIFFERENTIAL/PLATELET
Basophils Absolute: 0 10*3/uL (ref 0.0–0.1)
Basophils Relative: 0.4 % (ref 0.0–3.0)
Eosinophils Absolute: 0.1 10*3/uL (ref 0.0–0.7)
Eosinophils Relative: 1.7 % (ref 0.0–5.0)
HCT: 37.1 % (ref 36.0–46.0)
Hemoglobin: 12.5 g/dL (ref 12.0–15.0)
Lymphocytes Relative: 25.9 % (ref 12.0–46.0)
Lymphs Abs: 1.4 10*3/uL (ref 0.7–4.0)
MCHC: 33.7 g/dL (ref 30.0–36.0)
MCV: 89.8 fl (ref 78.0–100.0)
Monocytes Absolute: 0.6 10*3/uL (ref 0.1–1.0)
Monocytes Relative: 10.6 % (ref 3.0–12.0)
Neutro Abs: 3.3 10*3/uL (ref 1.4–7.7)
Neutrophils Relative %: 61.4 % (ref 43.0–77.0)
Platelets: 223 10*3/uL (ref 150.0–400.0)
RBC: 4.13 Mil/uL (ref 3.87–5.11)
RDW: 13 % (ref 11.5–15.5)
WBC: 5.3 10*3/uL (ref 4.0–10.5)

## 2019-08-29 LAB — BASIC METABOLIC PANEL
BUN: 9 mg/dL (ref 6–23)
CO2: 29 mEq/L (ref 19–32)
Calcium: 9.2 mg/dL (ref 8.4–10.5)
Chloride: 103 mEq/L (ref 96–112)
Creatinine, Ser: 0.73 mg/dL (ref 0.40–1.20)
GFR: 102.29 mL/min (ref >60.00)
Glucose, Bld: 111 mg/dL — ABNORMAL HIGH (ref 70–99)
Potassium: 4.2 mEq/L (ref 3.5–5.1)
Sodium: 138 mEq/L (ref 135–145)

## 2019-08-29 LAB — TSH: TSH: 1.31 u[IU]/mL (ref 0.35–4.50)

## 2019-08-29 LAB — HEMOGLOBIN A1C: Hgb A1c MFr Bld: 6.1 % (ref 4.6–6.5)

## 2019-09-09 ENCOUNTER — Telehealth: Payer: Self-pay | Admitting: Family Medicine

## 2019-09-09 DIAGNOSIS — R002 Palpitations: Secondary | ICD-10-CM

## 2019-09-09 NOTE — Telephone Encounter (Signed)
Pt called in stating that the palpitations are becoming more regularly and they are increasing more during the day. She did cut out caffeine and she has increased her water intake. Pt can be reached at the home #

## 2019-09-10 NOTE — Telephone Encounter (Signed)
Since EKG was WNL, next step for palpitations is cardiology work up

## 2019-09-10 NOTE — Telephone Encounter (Signed)
Please advise 

## 2019-09-10 NOTE — Telephone Encounter (Signed)
Referral placed and pt informed

## 2019-09-16 ENCOUNTER — Ambulatory Visit: Payer: BC Managed Care – PPO | Admitting: Cardiology

## 2019-09-17 ENCOUNTER — Other Ambulatory Visit: Payer: Self-pay | Admitting: Family Medicine

## 2019-09-17 DIAGNOSIS — M791 Myalgia, unspecified site: Secondary | ICD-10-CM

## 2019-09-18 NOTE — Telephone Encounter (Signed)
RF request for Methocarbamol  LOV:08/27/19 Next ov: n/a Last written:07/15/19(10,0)  Medication pending. Please advise, thanks.

## 2019-09-19 ENCOUNTER — Encounter: Payer: Self-pay | Admitting: Cardiology

## 2019-09-19 ENCOUNTER — Ambulatory Visit: Payer: BC Managed Care – PPO | Admitting: Cardiology

## 2019-09-19 ENCOUNTER — Other Ambulatory Visit: Payer: Self-pay

## 2019-09-19 ENCOUNTER — Ambulatory Visit (INDEPENDENT_AMBULATORY_CARE_PROVIDER_SITE_OTHER): Payer: BC Managed Care – PPO

## 2019-09-19 VITALS — BP 126/86 | HR 81 | Ht 66.0 in | Wt 293.0 lb

## 2019-09-19 DIAGNOSIS — I10 Essential (primary) hypertension: Secondary | ICD-10-CM | POA: Diagnosis not present

## 2019-09-19 DIAGNOSIS — R002 Palpitations: Secondary | ICD-10-CM | POA: Diagnosis not present

## 2019-09-19 DIAGNOSIS — E782 Mixed hyperlipidemia: Secondary | ICD-10-CM | POA: Diagnosis not present

## 2019-09-19 DIAGNOSIS — R011 Cardiac murmur, unspecified: Secondary | ICD-10-CM

## 2019-09-19 NOTE — Patient Instructions (Signed)
Medication Instructions:  No medicaton changes *If you need a refill on your cardiac medications before your next appointment, please call your pharmacy*   Lab Work: None ordered If you have labs (blood work) drawn today and your tests are completely normal, you will receive your results only by: Marland Kitchen MyChart Message (if you have MyChart) OR . A paper copy in the mail If you have any lab test that is abnormal or we need to change your treatment, we will call you to review the results.   Testing/Procedures: Your physician has requested that you have an echocardiogram. Echocardiography is a painless test that uses sound waves to create images of your heart. It provides your doctor with information about the size and shape of your heart and how well your heart's chambers and valves are working. This procedure takes approximately one hour. There are no restrictions for this procedure.   WHY IS MY DOCTOR PRESCRIBING ZIO? The Zio system is proven and trusted by physicians to detect and diagnose irregular heart rhythms - and has been prescribed to hundreds of thousands of patients.  The FDA has cleared the Zio system to monitor for many different kinds of irregular heart rhythms. In a study, physicians were able to reach a diagnosis 90% of the time with the Zio system1.  You can wear the Zio monitor - a small, discreet, comfortable patch - during your normal day-to-day activity, including while you sleep, shower, and exercise, while it records every single heartbeat for analysis.  1Barrett, P., et al. Comparison of 24 Hour Holter Monitoring Versus 14 Day Novel Adhesive Patch Electrocardiographic Monitoring. American Journal of Medicine, 2014.  ZIO VS. HOLTER MONITORING The Zio monitor can be comfortably worn for up to 14 days. Holter monitors can be worn for 24 to 48 hours, limiting the time to record any irregular heart rhythms you may have. Zio is able to capture data for the 51% of patients who  have their first symptom-triggered arrhythmia after 48 hours.1  LIVE WITHOUT RESTRICTIONS The Zio ambulatory cardiac monitor is a small, unobtrusive, and water-resistant patch-you might even forget you're wearing it. The Zio monitor records and stores every beat of your heart, whether you're sleeping, working out, or showering.    Follow-Up: At [facility], you and your health needs are our priority.  As part of our continuing mission to provide you with exceptional heart care, we have created designated Provider Care Teams.  These Care Teams include your primary Cardiologist (physician) and Advanced Practice Providers (APPs -  Physician Assistants and Nurse Practitioners) who all work together to provide you with the care you need, when you need it.  We recommend signing up for the patient portal called "MyChart".  Sign up information is provided on this After Visit Summary.  MyChart is used to connect with patients for Virtual Visits (Telemedicine).  Patients are able to view lab/test results, encounter notes, upcoming appointments, etc.  Non-urgent messages can be sent to your provider as well.   To learn more about what you can do with MyChart, go to [email or web address].    Your next appointment:   3 month(s)  The format for your next appointment:   In Person  Provider:   Belva Crome, MD   Other Instructions  Echocardiogram An echocardiogram is a procedure that uses painless sound waves (ultrasound) to produce an image of the heart. Images from an echocardiogram can provide important information about:  Signs of coronary artery disease (CAD).  Aneurysm detection. An  aneurysm is a weak or damaged part of an artery wall that bulges out from the normal force of blood pumping through the body.  Heart size and shape. Changes in the size or shape of the heart can be associated with certain conditions, including heart failure, aneurysm, and CAD.  Heart muscle function.   Heart valve function.  Signs of a past heart attack.  Fluid buildup around the heart.  Thickening of the heart muscle.  A tumor or infectious growth around the heart valves. Tell a health care provider about:  Any allergies you have.  All medicines you are taking, including vitamins, herbs, eye drops, creams, and over-the-counter medicines.  Any blood disorders you have.  Any surgeries you have had.  Any medical conditions you have.  Whether you are pregnant or may be pregnant. What are the risks? Generally, this is a safe procedure. However, problems may occur, including:  Allergic reaction to dye (contrast) that may be used during the procedure. What happens before the procedure? No specific preparation is needed. You may eat and drink normally. What happens during the procedure?   An IV tube may be inserted into one of your veins.  You may receive contrast through this tube. A contrast is an injection that improves the quality of the pictures from your heart.  A gel will be applied to your chest.  A wand-like tool (transducer) will be moved over your chest. The gel will help to transmit the sound waves from the transducer.  The sound waves will harmlessly bounce off of your heart to allow the heart images to be captured in real-time motion. The images will be recorded on a computer. The procedure may vary among health care providers and hospitals. What happens after the procedure?  You may return to your normal, everyday life, including diet, activities, and medicines, unless your health care provider tells you not to do that. Summary  An echocardiogram is a procedure that uses painless sound waves (ultrasound) to produce an image of the heart.  Images from an echocardiogram can provide important information about the size and shape of your heart, heart muscle function, heart valve function, and fluid buildup around your heart.  You do not need to do anything to  prepare before this procedure. You may eat and drink normally.  After the echocardiogram is completed, you may return to your normal, everyday life, unless your health care provider tells you not to do that. This information is not intended to replace advice given to you by your health care provider. Make sure you discuss any questions you have with your health care provider. Document Revised: 10/10/2018 Document Reviewed: 07/22/2016 Elsevier Patient Education  [place].

## 2019-09-19 NOTE — Progress Notes (Signed)
Cardiology Office Note:    Date:  09/19/2019   ID:  Marcia Chan, DOB July 29, 1970, MRN [id number]  PCP:  Sheliah Hatch, MD  Cardiologist:  Garwin Brothers, MD   Referring MD: Sheliah Hatch, MD    ASSESSMENT:    1. Essential hypertension   2. Mixed hyperlipidemia   3. Palpitations   4. Morbid obesity (HCC)   5. Cardiac murmur    PLAN:    In order of problems listed above:  1. Primary prevention stressed with patient the importance of compliance with diet and medication stressed and she vocalized understanding.   2. Essential hypertension: Blood pressure is stable and she monitors it carefully. 3. Mixed dyslipidemia: Diet was discussed importance of dieting stressed.  She is followed for this by her primary care physician. 4. Cardiac murmur: Echocardiogram will be done to assess murmur on auscultation. 5. Morbid obesity: Diet was emphasized including salt intake issues.  Weight reduction was stressed risks of obesity explained and she promises to do better.  I told her to walk more of 30 minutes a day at least 5 days a week and she plans to do this very meticulously at home. 6. Patient will be seen in follow-up appointment in 3 months or earlier if the patient has any concerns    Medication Adjustments/Labs and Tests Ordered: Current medicines are reviewed at length with the patient today.  Concerns regarding medicines are outlined above.  No orders of the defined types were placed in this encounter.  No orders of the defined types were placed in this encounter.    History of Present Illness:    Marcia Chan is a 49 y.o. female who is being seen today for the evaluation of palpitations and essential hypertension at the request of Tabori, [name], MD.  Patient is a pleasant 49 year old female.  She has past medical history of essential hypertension and dyslipidemia.  She mentioned to me that she has an abnormal awareness of her heartbeat.  She  mentions to me that when she sleeps she can feel her heart beating and is concerned because she feels it is on the faster side.  No chest pain orthopnea or PND.  She saw her primary care physician and had evaluation including blood work and liver and thyroid test was told to be fine.  No orthopnea or PND.  No chest pain.  She mentions to me that she walks about 45 minutes twice a week without any problems.  At the time of my evaluation, the patient is alert awake oriented and in no distress.  Past Medical History:  Diagnosis Date  . Anxiety 05/01/2019  . Cough 06/21/2011  . General medical examination 10/06/2011  . GERD (gastroesophageal reflux disease)   . Hair loss 02/21/2011  . HTN (hypertension) 02/10/2011  . Hyperlipidemia   . Hypertension   . Lateral epicondylitis 12/23/2012  . Lumbar back pain 12/23/2012  . Nail fungus 02/10/2011  . Numbness and tingling of left leg 06/09/2013  . Plantar fasciitis of left foot 01/06/2014  . Primary osteoarthritis of both knees 08/30/2018   Bilateral mild to moderate osteoarthritis and mild chondromalacia patella  . Seasonal allergic rhinitis 10/08/2011  . Severe obesity (BMI >= 40) (HCC) 02/10/2011  . Vertigo   . Vitamin D deficiency 12/14/2016    Past Surgical History:  Procedure Laterality Date  . DILATION AND CURETTAGE OF UTERUS  2002, 2003    Current Medications: Current Meds  Medication Sig  .  carvedilol (COREG) 6.25 MG tablet TAKE 1 TABLET BY MOUTH TWICE DAILY WITH A MEAL  . hyoscyamine (LEVSIN SL) 0.125 MG SL tablet Place 1 tablet (0.125 mg total) under the tongue every 4 (four) hours as needed.  Marland Kitchen levonorgestrel (MIRENA) 20 MCG/24HR IUD 1 each by Intrauterine route once.  . meclizine (ANTIVERT) 25 MG tablet Take 1 tablet (25 mg total) by mouth 3 (three) times daily as needed for dizziness.  . medroxyPROGESTERone (PROVERA) 10 MG tablet Take 10 mg by mouth daily.  . methocarbamol (ROBAXIN) 500 MG tablet TAKE 1 TO 2 TABLETS(500 TO 1000 MG) BY MOUTH  EVERY 8 HOURS AS NEEDED FOR MUSCLE SPASMS  . pantoprazole (PROTONIX) 40 MG tablet Take 1 tablet (40 mg total) by mouth 2 (two) times daily.  . pravastatin (PRAVACHOL) 40 MG tablet Take 1 tablet (40 mg total) by mouth daily.  Marland Kitchen triamterene-hydrochlorothiazide (MAXZIDE-25) 37.5-25 MG tablet Take 1 tablet by mouth daily.     Allergies:   Bextra [valdecoxib]   Social History   Socioeconomic History  . Marital status: Married    Spouse name: Not on file  . Number of children: Not on file  . Years of education: Not on file  . Highest education level: Not on file  Occupational History  . Not on file  Tobacco Use  . Smoking status: Never Smoker  . Smokeless tobacco: Never Used  Substance and Sexual Activity  . Alcohol use: No  . Drug use: No  . Sexual activity: Not on file  Other Topics Concern  . Not on file  Social History Narrative   G3 P1 A2   Social Determinants of Health   Financial Resource Strain:   . Difficulty of Paying Living Expenses:   Food Insecurity:   . Worried About Programme researcher, broadcasting/film/video in the Last Year:   . Barista in the Last Year:   Transportation Needs:   . Freight forwarder (Medical):   Marland Kitchen Lack of Transportation (Non-Medical):   Physical Activity:   . Days of Exercise per Week:   . Minutes of Exercise per Session:   Stress:   . Feeling of Stress :   Social Connections:   . Frequency of Communication with Friends and Family:   . Frequency of Social Gatherings with Friends and Family:   . Attends Religious Services:   . Active Member of Clubs or Organizations:   . Attends Banker Meetings:   Marland Kitchen Marital Status:      Family History: The patient's family history includes Arthritis in her mother; Cancer in her father; Diabetes in her mother; Heart disease in her mother; Hypertension in her father and mother. There is no history of Colon cancer.  ROS:   Please see the history of present illness.    All other systems reviewed and  are negative.  EKGs/Labs/Other Studies Reviewed:    The following studies were reviewed today: EKG was sinus rhythm and nonspecific ST-T changes   Recent Labs: 08/29/2019: ALT 13; BUN 9; Creatinine, Ser 0.73; Hemoglobin 12.5; Platelets 223.0; Potassium 4.2; Sodium 138; TSH 1.31  Recent Lipid Panel    Component Value Date/Time   CHOL 172 05/01/2019 0929   TRIG 63.0 05/01/2019 0929   HDL 37.20 (L) 05/01/2019 0929   CHOLHDL 5 05/01/2019 0929   VLDL 12.6 05/01/2019 0929   LDLCALC 122 (H) 05/01/2019 0929   LDLCALC 108 (H) 07/12/2017 1638    Physical Exam:    VS:  BP  126/86   Pulse 81   Ht 5\' 6"  (1.676 m)   Wt 293 lb (132.9 kg)   SpO2 99%   BMI 47.29 kg/m     Wt Readings from Last 3 Encounters:  09/19/19 293 lb (132.9 kg)  05/01/19 289 lb 8 oz (131.3 kg)  02/17/19 289 lb (131.1 kg)     GEN: Patient is in no acute distress HEENT: Normal NECK: No JVD; No carotid bruits LYMPHATICS: No lymphadenopathy CARDIAC: S1 S2 regular, 2/6 systolic murmur at the apex. RESPIRATORY:  Clear to auscultation without rales, wheezing or rhonchi  ABDOMEN: Soft, non-tender, non-distended MUSCULOSKELETAL:  No edema; No deformity  SKIN: Warm and dry NEUROLOGIC:  Alert and oriented x 3 PSYCHIATRIC:  Normal affect    Signed, Jenean Lindau, MD  09/19/2019 4:09 PM    [facility]

## 2019-09-29 ENCOUNTER — Ambulatory Visit (HOSPITAL_BASED_OUTPATIENT_CLINIC_OR_DEPARTMENT_OTHER): Payer: BC Managed Care – PPO

## 2019-10-22 ENCOUNTER — Encounter: Payer: Self-pay | Admitting: General Practice

## 2019-10-22 ENCOUNTER — Other Ambulatory Visit: Payer: Self-pay | Admitting: General Practice

## 2019-10-22 ENCOUNTER — Telehealth: Payer: Self-pay | Admitting: General Practice

## 2019-10-22 MED ORDER — CYCLOBENZAPRINE HCL 5 MG PO TABS: 5.0000 mg | ORAL_TABLET | Freq: Three times a day (TID) | ORAL | 1 refills | Status: DC | PRN

## 2019-10-22 MED ORDER — TRIAMTERENE-HCTZ 37.5-25 MG PO TABS: 1.0000 | ORAL_TABLET | Freq: Every day | ORAL | 0 refills | Status: DC

## 2019-10-22 MED ORDER — CARVEDILOL 6.25 MG PO TABS: ORAL_TABLET | ORAL | 0 refills | Status: DC

## 2019-10-22 MED ORDER — PANTOPRAZOLE SODIUM 40 MG PO TBEC: 40.0000 mg | DELAYED_RELEASE_TABLET | Freq: Two times a day (BID) | ORAL | 0 refills | Status: DC

## 2019-10-22 NOTE — Telephone Encounter (Signed)
Received a fax from pt pharmacy that advised robaxin is no longer covered. They would prefer pt to be on flexeril, tizanidine, orphenadrine, or carisoprodol

## 2019-10-22 NOTE — Telephone Encounter (Signed)
Flexeril 5mg  TID prn, #45, 1 refill

## 2019-10-22 NOTE — Telephone Encounter (Signed)
New medication filled and pt sent a message to advise.

## 2019-10-22 NOTE — Addendum Note (Signed)
Addended by: Geannie Risen on: 10/22/2019 04:21 PM   Modules accepted: Orders

## 2019-10-24 ENCOUNTER — Ambulatory Visit: Payer: BC Managed Care – PPO | Admitting: Family Medicine

## 2019-10-29 ENCOUNTER — Encounter: Payer: Self-pay | Admitting: Family Medicine

## 2019-10-29 ENCOUNTER — Other Ambulatory Visit: Payer: Self-pay

## 2019-10-29 ENCOUNTER — Ambulatory Visit: Payer: BC Managed Care – PPO | Admitting: Family Medicine

## 2019-10-29 VITALS — BP 128/80 | HR 91 | Temp 98.1°F | Resp 17 | Ht 66.0 in | Wt 292.2 lb

## 2019-10-29 DIAGNOSIS — I10 Essential (primary) hypertension: Secondary | ICD-10-CM

## 2019-10-29 DIAGNOSIS — E782 Mixed hyperlipidemia: Secondary | ICD-10-CM

## 2019-10-29 LAB — BASIC METABOLIC PANEL
BUN: 11 mg/dL (ref 6–23)
CO2: 30 mEq/L (ref 19–32)
Calcium: 9.1 mg/dL (ref 8.4–10.5)
Chloride: 103 mEq/L (ref 96–112)
Creatinine, Ser: 0.72 mg/dL (ref 0.40–1.20)
GFR: 103.86 mL/min (ref >60.00)
Glucose, Bld: 104 mg/dL — ABNORMAL HIGH (ref 70–99)
Potassium: 3.9 mEq/L (ref 3.5–5.1)
Sodium: 138 mEq/L (ref 135–145)

## 2019-10-29 LAB — HEPATIC FUNCTION PANEL
ALT: 13 U/L (ref 0–35)
AST: 13 U/L (ref 0–37)
Albumin: 4 g/dL (ref 3.5–5.2)
Alkaline Phosphatase: 53 U/L (ref 39–117)
Bilirubin, Direct: 0.2 mg/dL (ref 0.0–0.3)
Total Bilirubin: 0.7 mg/dL (ref 0.2–1.2)
Total Protein: 7 g/dL (ref 6.0–8.3)

## 2019-10-29 LAB — LIPID PANEL
Cholesterol: 154 mg/dL (ref 0–200)
HDL: 29.6 mg/dL — ABNORMAL LOW (ref >39.00)
LDL Cholesterol: 113 mg/dL — ABNORMAL HIGH (ref 0–99)
NonHDL: 124.57
Total CHOL/HDL Ratio: 5
Triglycerides: 59 mg/dL (ref 0.0–149.0)
VLDL: 11.8 mg/dL (ref 0.0–40.0)

## 2019-10-29 LAB — TSH: TSH: 1.41 u[IU]/mL (ref 0.35–4.50)

## 2019-10-29 NOTE — Patient Instructions (Signed)
Schedule your complete physical in 6 months We'll notify you of your lab results and make any changes if needed Continue to work on healthy diet and regular exercise- you can do it! Call with any questions or concerns Stay Safe!  Stay Healthy! 

## 2019-10-29 NOTE — Progress Notes (Signed)
   Subjective:    Patient ID: Marcia Chan, female    DOB: 20-Aug-1970, 49 y.o.   MRN: [id number]  HPI HTN- chronic problem, on Coreg 6.25mg  BID, Triamterene HCTZ 37.5/25mg  daily w/ good control.  No CP, SOB, HAs, visual changes, edema.  Hyperlipidemia- chronic problem, on Pravastatin 40mg  daily.  Denies abd pain, N/V  Obesity- chronic problem, BMI 47.17  Having trouble w/ motivation to exercise.   Review of Systems For ROS see HPI   This visit occurred during the SARS-CoV-2 public health emergency.  Safety protocols were in place, including screening questions prior to the visit, additional usage of staff PPE, and extensive cleaning of exam room while observing appropriate contact time as indicated for disinfecting solutions.       Objective:   Physical Exam Vitals reviewed.  Constitutional:      General: She is not in acute distress.    Appearance: Normal appearance. She is well-developed. She is obese.  HENT:     Head: Normocephalic and atraumatic.  Eyes:     Conjunctiva/sclera: Conjunctivae normal.     Pupils: Pupils are equal, round, and reactive to light.  Neck:     Thyroid: No thyromegaly.  Cardiovascular:     Rate and Rhythm: Normal rate and regular rhythm.     Heart sounds: Normal heart sounds. No murmur.  Pulmonary:     Effort: Pulmonary effort is normal. No respiratory distress.     Breath sounds: Normal breath sounds.  Abdominal:     General: There is no distension.     Palpations: Abdomen is soft.     Tenderness: There is no abdominal tenderness.  Musculoskeletal:     Cervical back: Normal range of motion and neck supple.  Lymphadenopathy:     Cervical: No cervical adenopathy.  Skin:    General: Skin is warm and dry.  Neurological:     Mental Status: She is alert and oriented to person, place, and time.  Psychiatric:        Behavior: Behavior normal.           Assessment & Plan:

## 2019-10-29 NOTE — Assessment & Plan Note (Signed)
Ongoing issue for pt.  Stressed need for healthy diet and regular exercise.  Encouraged her to schedule exercise like an appt so she would be less likely to skip it.  Check labs to risk stratify.  Will follow.

## 2019-10-29 NOTE — Assessment & Plan Note (Signed)
Chronic problem.  Tolerating statin w/o difficulty.  Check labs.  Adjust meds prn  

## 2019-10-29 NOTE — Assessment & Plan Note (Signed)
Chronic problem.  Well controlled today.  Asymptomatic.  Check labs.  No anticipated med changes.  Will follow. 

## 2019-10-30 ENCOUNTER — Encounter: Payer: Self-pay | Admitting: General Practice

## 2019-10-30 LAB — CBC WITH DIFFERENTIAL/PLATELET
Basophils Absolute: 0 10*3/uL (ref 0.0–0.1)
Basophils Relative: 0.5 % (ref 0.0–3.0)
Eosinophils Absolute: 0.1 10*3/uL (ref 0.0–0.7)
Eosinophils Relative: 1.2 % (ref 0.0–5.0)
HCT: 37.3 % (ref 36.0–46.0)
Hemoglobin: 12.6 g/dL (ref 12.0–15.0)
Lymphocytes Relative: 19.9 % (ref 12.0–46.0)
Lymphs Abs: 1.4 10*3/uL (ref 0.7–4.0)
MCHC: 33.7 g/dL (ref 30.0–36.0)
MCV: 90 fl (ref 78.0–100.0)
Monocytes Absolute: 0.7 10*3/uL (ref 0.1–1.0)
Monocytes Relative: 10.3 % (ref 3.0–12.0)
Neutro Abs: 4.7 10*3/uL (ref 1.4–7.7)
Neutrophils Relative %: 68.1 % (ref 43.0–77.0)
Platelets: 231 10*3/uL (ref 150.0–400.0)
RBC: 4.14 Mil/uL (ref 3.87–5.11)
RDW: 13 % (ref 11.5–15.5)
WBC: 6.9 10*3/uL (ref 4.0–10.5)

## 2019-11-21 ENCOUNTER — Encounter: Payer: Self-pay | Admitting: Family Medicine

## 2019-11-21 ENCOUNTER — Ambulatory Visit: Payer: BC Managed Care – PPO | Admitting: Family Medicine

## 2019-11-21 ENCOUNTER — Other Ambulatory Visit: Payer: Self-pay

## 2019-11-21 VITALS — BP 124/78 | HR 88 | Temp 97.3°F | Resp 16 | Wt 296.6 lb

## 2019-11-21 DIAGNOSIS — M545 Low back pain, unspecified: Secondary | ICD-10-CM

## 2019-11-21 DIAGNOSIS — R350 Frequency of micturition: Secondary | ICD-10-CM

## 2019-11-21 DIAGNOSIS — R109 Unspecified abdominal pain: Secondary | ICD-10-CM

## 2019-11-21 LAB — POCT URINALYSIS DIPSTICK
Bilirubin, UA: NEGATIVE
Blood, UA: POSITIVE
Glucose, UA: NEGATIVE
Ketones, UA: NEGATIVE
Leukocytes, UA: NEGATIVE
Nitrite, UA: NEGATIVE
Protein, UA: NEGATIVE
Spec Grav, UA: 1.02 (ref 1.010–1.025)
Urobilinogen, UA: 0.2 E.U./dL
pH, UA: 7 (ref 5.0–8.0)

## 2019-11-21 MED ORDER — PREDNISONE 10 MG PO TABS
ORAL_TABLET | ORAL | 0 refills | Status: DC
Start: 2019-11-21 — End: 2020-02-18

## 2019-11-21 MED ORDER — CEPHALEXIN 500 MG PO CAPS: 500.0000 mg | ORAL_CAPSULE | Freq: Two times a day (BID) | ORAL | 0 refills | Status: AC

## 2019-11-21 MED ORDER — CYCLOBENZAPRINE HCL 5 MG PO TABS: 5.0000 mg | ORAL_TABLET | Freq: Three times a day (TID) | ORAL | 0 refills | Status: DC | PRN

## 2019-11-21 NOTE — Progress Notes (Signed)
   Subjective:    Patient ID: Marcia Chan, female    DOB: 07/20/1970, 49 y.o.   MRN: [id number]  HPI Back pain- R sided, has had 'flares' over the last few years.  Sxs started in R groin crease on 5/10 after leg 'buckled'.  A few days later she had R sided mid back pain that then settled into low back.  Some relief w/ 600mg  Motrin.  Minimal relief w/ Robaxin.  Took Flexeril w/ relief but was not able to work due to sedation.  No known injury, no recent lifting.  Urinary frequency- started Monday.  Then Wednesday had urge to go but didn't have to actually go.  + suprapubic pressure.  No burning w/ urination.  + urgency.   Review of Systems For ROS see HPI  This visit occurred during the SARS-CoV-2 public health emergency.  Safety protocols were in place, including screening questions prior to the visit, additional usage of staff PPE, and extensive cleaning of exam room while observing appropriate contact time as indicated for disinfecting solutions.       Objective:   Physical Exam Vitals reviewed.  Constitutional:      General: She is not in acute distress.    Appearance: Normal appearance. She is obese. She is not ill-appearing.     Comments: Uncomfortable w/ movement  HENT:     Head: Normocephalic and atraumatic.  Abdominal:     General: There is no distension.     Palpations: Abdomen is soft.     Tenderness: There is no abdominal tenderness. There is right CVA tenderness. There is no left CVA tenderness or guarding.  Musculoskeletal:        General: No deformity or signs of injury.     Comments: R thoracic back pain when rising from a seated position, back extension, and rotation  Skin:    General: Skin is warm and dry.     Findings: No rash.  Neurological:     Mental Status: She is alert.           Assessment & Plan:  Urinary frequency- new.  Pt's sxs and UA are suspicious for infxn.  Start Keflex while awaiting urine cx.  Pt expressed understanding and is in  agreement w/ plan.   Flank pain/back pain- new.  Pt has hx of musculoskeletal back pain.  She does have CVA tenderness but this doesn't occur unless she is moving (which goes against renal colic or pyelo)  Reviewed possibility for a stone but since pt improved w/ Flexeril and NSAIDs, will treat first as musculoskeletal and only image if sxs don't improve.  Pt expressed understanding and is in agreement w/ plan.

## 2019-11-21 NOTE — Patient Instructions (Signed)
Follow up as needed or as scheduled START the Prednisone as directed- take w/ food USE the Flexeril for spasm- will cause drowsiness so best at night HEAT! TAKE the Cephalexin as directed for possible UTI Drink LOTS of fluids If symptoms change or worsen, let me know! Hang in there!

## 2019-11-22 LAB — URINE CULTURE
MICRO NUMBER:: 10506323
SPECIMEN QUALITY:: ADEQUATE

## 2019-11-24 ENCOUNTER — Other Ambulatory Visit: Payer: Self-pay | Admitting: Family Medicine

## 2019-11-24 DIAGNOSIS — R319 Hematuria, unspecified: Secondary | ICD-10-CM

## 2019-11-24 DIAGNOSIS — R109 Unspecified abdominal pain: Secondary | ICD-10-CM

## 2019-11-24 MED ORDER — TRAMADOL HCL 50 MG PO TABS: 50.0000 mg | ORAL_TABLET | Freq: Three times a day (TID) | ORAL | 0 refills | Status: AC | PRN

## 2019-11-24 MED ORDER — TAMSULOSIN HCL 0.4 MG PO CAPS
0.4000 mg | ORAL_CAPSULE | Freq: Every day | ORAL | 3 refills | Status: DC
Start: 2019-11-24 — End: 2020-02-18

## 2019-11-25 ENCOUNTER — Other Ambulatory Visit (HOSPITAL_BASED_OUTPATIENT_CLINIC_OR_DEPARTMENT_OTHER): Payer: Self-pay | Admitting: Family Medicine

## 2019-11-27 ENCOUNTER — Other Ambulatory Visit: Payer: Self-pay

## 2019-11-27 ENCOUNTER — Ambulatory Visit (HOSPITAL_BASED_OUTPATIENT_CLINIC_OR_DEPARTMENT_OTHER)
Admission: RE | Admit: 2019-11-27 | Discharge: 2019-11-27 | Disposition: A | Discharge status: Home / Self Care | Payer: BC Managed Care – PPO | Source: Ambulatory Visit | Attending: Family Medicine | Admitting: Family Medicine

## 2019-11-27 DIAGNOSIS — R319 Hematuria, unspecified: Secondary | ICD-10-CM | POA: Diagnosis present

## 2019-11-27 DIAGNOSIS — R109 Unspecified abdominal pain: Secondary | ICD-10-CM | POA: Diagnosis present

## 2019-11-28 ENCOUNTER — Other Ambulatory Visit: Payer: Self-pay | Admitting: Family Medicine

## 2019-11-28 DIAGNOSIS — R109 Unspecified abdominal pain: Secondary | ICD-10-CM

## 2019-11-28 DIAGNOSIS — R319 Hematuria, unspecified: Secondary | ICD-10-CM

## 2019-12-26 ENCOUNTER — Ambulatory Visit: Payer: BC Managed Care – PPO | Admitting: Cardiology

## 2020-01-19 ENCOUNTER — Other Ambulatory Visit: Payer: Self-pay | Admitting: Family Medicine

## 2020-01-20 ENCOUNTER — Other Ambulatory Visit: Payer: Self-pay | Admitting: General Practice

## 2020-01-20 MED ORDER — PRAVASTATIN SODIUM 40 MG PO TABS: 40.0000 mg | ORAL_TABLET | Freq: Every day | ORAL | 1 refills | Status: DC

## 2020-01-21 ENCOUNTER — Telehealth: Payer: Self-pay | Admitting: Medical

## 2020-01-21 ENCOUNTER — Other Ambulatory Visit: Payer: Self-pay

## 2020-01-21 ENCOUNTER — Ambulatory Visit: Payer: BC Managed Care – PPO | Admitting: Medical

## 2020-01-21 ENCOUNTER — Ambulatory Visit (HOSPITAL_BASED_OUTPATIENT_CLINIC_OR_DEPARTMENT_OTHER)
Admission: RE | Admit: 2020-01-21 | Discharge: 2020-01-21 | Disposition: A | Discharge status: Home / Self Care | Payer: BC Managed Care – PPO | Source: Ambulatory Visit | Attending: Medical | Admitting: Medical

## 2020-01-21 VITALS — BP 118/78 | HR 88 | Temp 98.2°F | Resp 18 | Ht 66.0 in | Wt 298.4 lb

## 2020-01-21 DIAGNOSIS — M25552 Pain in left hip: Secondary | ICD-10-CM | POA: Insufficient documentation

## 2020-01-21 DIAGNOSIS — M25551 Pain in right hip: Secondary | ICD-10-CM

## 2020-01-21 DIAGNOSIS — R1031 Right lower quadrant pain: Secondary | ICD-10-CM

## 2020-01-21 MED ORDER — KETOROLAC TROMETHAMINE 60 MG/2ML IM SOLN
60.0000 mg | Freq: Once | INTRAMUSCULAR | Status: AC
Start: 2020-01-21 — End: 2020-01-21
  Administered 2020-01-21: 60 mg via INTRAMUSCULAR

## 2020-01-21 MED ORDER — DICLOFENAC SODIUM 75 MG PO TBEC
75.0000 mg | DELAYED_RELEASE_TABLET | Freq: Two times a day (BID) | ORAL | 0 refills | Status: DC
Start: 2020-01-21 — End: 2020-02-18

## 2020-01-21 NOTE — Telephone Encounter (Signed)
Referral to ortho placed.

## 2020-01-21 NOTE — Progress Notes (Signed)
Subjective:    Patient ID: Marcia Chan, female    DOB: 01/04/1971, 49 y.o.   MRN: [id number]  HPI  Pt in with some rt lower ext pain. Pt describes origin of pain is in rt groin area. Pain will occur randomly. Random quick burning and shooting pain. 2 weeks ago had pain that came on and lasted 3 days. No rash or blister in area. Pain went away after 3 days. Then 4 days ago pain came back. Mild at first then gradual increase. Pt states caused her to limp. Still present. Pain worse when she sits and then stands.   No pain in her back at all with groin pain.   She does mentions hx of very low level back pain in the past. With remote hx of radiating down leg 4 years ago.  Pt never got xray of her back.  Pt states advil brought pain down to 3/10 level.    Review of Systems  Constitutional: Negative for chills, fatigue and fever.  Respiratory: Negative for cough, chest tightness, shortness of breath and wheezing.   Cardiovascular: Negative for chest pain and palpitations.  Gastrointestinal: Negative for abdominal pain.  Musculoskeletal: Negative for back pain.       Rt hip pain.  Skin: Negative for rash.  Neurological: Negative for dizziness and headaches.  Hematological: Negative for adenopathy. Does not bruise/bleed easily.  Psychiatric/Behavioral: Negative for behavioral problems, dysphoric mood and suicidal ideas. The patient is not nervous/anxious.     Past Medical History:  Diagnosis Date   Anxiety 05/01/2019   Cough 06/21/2011   General medical examination 10/06/2011   GERD (gastroesophageal reflux disease)    Hair loss 02/21/2011   HTN (hypertension) 02/10/2011   Hyperlipidemia    Hypertension    Lateral epicondylitis 12/23/2012   Lumbar back pain 12/23/2012   Nail fungus 02/10/2011   Numbness and tingling of left leg 06/09/2013   Plantar fasciitis of left foot 01/06/2014   Primary osteoarthritis of both knees 08/30/2018   Bilateral mild to moderate  osteoarthritis and mild chondromalacia patella   Seasonal allergic rhinitis 10/08/2011   Severe obesity (BMI >= 40) (HCC) 02/10/2011   Vertigo    Vitamin D deficiency 12/14/2016     Social History   Socioeconomic History   Marital status: Married    Spouse name: Not on file   Number of children: Not on file   Years of education: Not on file   Highest education level: Not on file  Occupational History   Not on file  Tobacco Use   Smoking status: Never Smoker   Smokeless tobacco: Never Used  Vaping Use   Vaping Use: Never used  Substance and Sexual Activity   Alcohol use: No   Drug use: No   Sexual activity: Not on file  Other Topics Concern   Not on file  Social History Narrative   G3 P1 A2   Social Determinants of Health   Financial Resource Strain:    Difficulty of Paying Living Expenses:   Food Insecurity:    Worried About Programme researcher, broadcasting/film/video in the Last Year:    Barista in the Last Year:   Transportation Needs:    Lack of Transportation (Medical):    Lack of Transportation (Non-Medical):   Physical Activity:    Days of Exercise per Week:    Minutes of Exercise per Session:   Stress:    Feeling of Stress :   Social Connections:  Frequency of Communication with Friends and Family:    Frequency of Social Gatherings with Friends and Family:    Attends Religious Services:    Active Member of Clubs or Organizations:    Attends Engineer, structural:    Marital Status:   Intimate Partner Violence:    Fear of Current or Ex-Partner:    Emotionally Abused:    Physically Abused:    Sexually Abused:     Past Surgical History:  Procedure Laterality Date   DILATION AND CURETTAGE OF UTERUS  2002, 2003    Family History  Problem Relation Age of Onset   Arthritis Mother        OA/DJD   Heart disease Mother    Diabetes Mother    Hypertension Mother    Cancer Father    Hypertension Father    Colon cancer  Neg Hx     Allergies  Allergen Reactions   Bextra [Valdecoxib]     Rash     Current Outpatient Medications on File Prior to Visit  Medication Sig Dispense Refill   carvedilol (COREG) 6.25 MG tablet TAKE 1 TABLET BY MOUTH TWICE DAILY WITH A MEAL 180 tablet 0   cyclobenzaprine (FLEXERIL) 5 MG tablet Take 1 tablet (5 mg total) by mouth 3 (three) times daily as needed for muscle spasms. (Patient not taking: Reported on 01/21/2020) 45 tablet 0   hyoscyamine (LEVSIN SL) 0.125 MG SL tablet Place 1 tablet (0.125 mg total) under the tongue every 4 (four) hours as needed. (Patient not taking: Reported on 11/21/2019) 30 tablet 0   levonorgestrel (MIRENA) 20 MCG/24HR IUD 1 each by Intrauterine route once.     meclizine (ANTIVERT) 25 MG tablet Take 1 tablet (25 mg total) by mouth 3 (three) times daily as needed for dizziness. 30 tablet 0   medroxyPROGESTERone (PROVERA) 10 MG tablet Take 10 mg by mouth daily.     methocarbamol (ROBAXIN) 500 MG tablet TAKE 1 TO 2 TABLETS(500 TO 1000 MG) BY MOUTH EVERY 8 HOURS AS NEEDED FOR MUSCLE SPASMS (Patient not taking: Reported on 01/21/2020) 40 tablet 0   pantoprazole (PROTONIX) 40 MG tablet TAKE 1 TABLET(40 MG) BY MOUTH TWICE DAILY 180 tablet 0   pravastatin (PRAVACHOL) 40 MG tablet Take 1 tablet (40 mg total) by mouth daily. 90 tablet 1   predniSONE (DELTASONE) 10 MG tablet 3 tabs x3 days and then 2 tabs x3 days and then 1 tab x3 days.  Take w/ food. (Patient not taking: Reported on 01/21/2020) 18 tablet 0   tamsulosin (FLOMAX) 0.4 MG CAPS capsule Take 1 capsule (0.4 mg total) by mouth daily. (Patient not taking: Reported on 01/21/2020) 30 capsule 3   triamterene-hydrochlorothiazide (MAXZIDE-25) 37.5-25 MG tablet TAKE 1 TABLET BY MOUTH DAILY 90 tablet 0   No current facility-administered medications on file prior to visit.    BP 118/78    Pulse 88    Temp 98.2 F (36.8 C) (Oral)    Resp 18    Ht 5\' 6"  (1.676 m)    Wt 298 lb 6.4 oz (135.4 kg)    SpO2 98%     BMI 48.16 kg/m       Objective:   Physical Exam  General Appearance- Not in acute distress.    Chest and Lung Exam Auscultation: Breath sounds:-Normal. Clear even and unlabored. Adventitious sounds:- No Adventitious sounds.  Cardiovascular Auscultation:Rythm - Regular, rate and rythm. Heart Sounds -Normal heart sounds.  Abdomen Inspection:-Inspection Normal.  Palpation/Perucssion: Palpation and Percussion  of the abdomen reveal- Non Tender, No Rebound tenderness, No rigidity(Guarding) and No Palpable abdominal masses.  Liver:-Normal.  Spleen:- Normal.   Back No Mid lumbar spine tenderness to palpation. Pain on straight leg lift rt hip but not in back.. No ain on lateral movements and flexion/extension of the spine.   Lower ext neurologic  L5-S1 sensation intact bilaterally. Normal patellar reflexes bilaterally. No foot drop bilaterally.  Rt hip- direct pain on palpation of rt hip and on range of motion. Some pain in rt groin area on palpation as well.    Assessment & Plan:  For hip pain will get xray of rt hip.  Will give toradol 60 mg im today. Start diclofenac tomorrow.  Based on level of pain and difficulty walking may refer to sports med or ortho.  Follow up date to be determined after lab review.  Esperanza Richters, PA-C   Time spent with patient today was 25  minutes which consisted of chart rediew, discussing diagnosis, work up, treatment, referral and documentation.

## 2020-01-21 NOTE — Patient Instructions (Addendum)
For hip pain will get xray of rt hip.  Will give toradol 60 mg im today. Start diclofenac tomorrow.  Based on level of pain and difficulty walking may refer to sports med or ortho.  Follow up date to be determined after lab review.

## 2020-01-21 NOTE — Telephone Encounter (Signed)
Opened to review 

## 2020-01-22 ENCOUNTER — Telehealth: Payer: Self-pay | Admitting: Family Medicine

## 2020-01-22 NOTE — Telephone Encounter (Signed)
Please advise 

## 2020-01-22 NOTE — Telephone Encounter (Signed)
Patient would like a referral to a weight loss physician - last appt here was 11/21/2019 - please advise

## 2020-01-22 NOTE — Telephone Encounter (Signed)
Pt notified and referral placed.  

## 2020-01-22 NOTE — Telephone Encounter (Signed)
Ok to refer to Dr Earlene Plater at [facility], dx obesity

## 2020-02-01 ENCOUNTER — Encounter (INDEPENDENT_AMBULATORY_CARE_PROVIDER_SITE_OTHER): Payer: Self-pay

## 2020-02-18 ENCOUNTER — Ambulatory Visit (INDEPENDENT_AMBULATORY_CARE_PROVIDER_SITE_OTHER): Payer: BC Managed Care – PPO | Admitting: Family Medicine

## 2020-02-18 ENCOUNTER — Other Ambulatory Visit: Payer: Self-pay

## 2020-02-18 ENCOUNTER — Encounter (INDEPENDENT_AMBULATORY_CARE_PROVIDER_SITE_OTHER): Payer: Self-pay | Admitting: Family Medicine

## 2020-02-18 VITALS — BP 122/82 | HR 78 | Temp 98.4°F | Ht 67.0 in | Wt 285.0 lb

## 2020-02-18 DIAGNOSIS — R5383 Other fatigue: Secondary | ICD-10-CM | POA: Diagnosis not present

## 2020-02-18 DIAGNOSIS — R0602 Shortness of breath: Secondary | ICD-10-CM

## 2020-02-18 DIAGNOSIS — Z1331 Encounter for screening for depression: Secondary | ICD-10-CM

## 2020-02-18 DIAGNOSIS — Z9189 Other specified personal risk factors, not elsewhere classified: Secondary | ICD-10-CM

## 2020-02-18 DIAGNOSIS — E559 Vitamin D deficiency, unspecified: Secondary | ICD-10-CM | POA: Diagnosis not present

## 2020-02-18 DIAGNOSIS — Z0289 Encounter for other administrative examinations: Secondary | ICD-10-CM

## 2020-02-18 DIAGNOSIS — E7849 Other hyperlipidemia: Secondary | ICD-10-CM

## 2020-02-18 DIAGNOSIS — I10 Essential (primary) hypertension: Secondary | ICD-10-CM | POA: Diagnosis not present

## 2020-02-18 DIAGNOSIS — Z6841 Body Mass Index (BMI) 40.0 and over, adult: Secondary | ICD-10-CM

## 2020-02-18 NOTE — Progress Notes (Signed)
Dear [name]. Beverely Low, MD,   Thank you for referring Marcia Chan to our clinic. The following note includes my evaluation and treatment recommendations.  Chief Complaint:   OBESITY Marcia Chan (MR# [id number]) is a 49 y.o. female who presents for evaluation and treatment of obesity and related comorbidities. Current BMI is Body mass index is 44.64 kg/m. Marcia Chan has been struggling with her weight for many years and has been unsuccessful in either losing weight, maintaining weight loss, or reaching her healthy weight goal.  Marcia Chan is lactose intolerant. She is doing grab and go a few nights a week. Breakfast-2 boiled eggs, Crystal light or water, and a mini muffin (satisfied). Lunch-leftovers from the day before (satisfied). Around 230-3 pm she has peanut butter and jelly and a bag of chips or a kids meal at [employer], chicken nuggets, fries, and Hi-C punch. Dinner-1 leg quarter chicken, 2 cups of rice, and 2 cups of green beans (feels full).  Marcia Chan is currently in the action stage of change and ready to dedicate time achieving and maintaining a healthier weight. Marcia Chan is interested in becoming our patient and working on intensive lifestyle modifications including (but not limited to) diet and exercise for weight loss.  Marcia Chan habits were reviewed today and are as follows: Her family eats meals together, she thinks her family will eat healthier with her, her desired weight loss is 35 lbs, she has been heavy most of her life, she started gaining weight at 49 years old, her heaviest weight ever was 298 pounds, she is a picky eater and doesn't like to eat healthier foods, she has significant food cravings issues, she frequently makes poor food choices and she struggles with emotional eating.  Depression Screen [name]'s Food and Mood (modified PHQ-9) score was 8.  Depression screen [name] 2/9 02/18/2020  Decreased Interest 2  Down, Depressed, Hopeless 1    PHQ - 2 Score 3  Altered sleeping 1  Tired, decreased energy 3  Change in appetite 1  Feeling bad or failure about yourself  0  Trouble concentrating 0  Moving slowly or fidgety/restless 0  Suicidal thoughts 0  PHQ-9 Score 8  Difficult doing work/chores Somewhat difficult  Some recent data might be hidden   Subjective:   1. Other fatigue Marcia Chan admits to daytime somnolence and admits to waking up still tired. Patent has a history of symptoms of daytime fatigue. Marcia Chan generally gets 5 hours of sleep per night, and states that she has generally restful sleep. Snoring is present. Apneic episodes are not present. Epworth Sleepiness Score is 2.  2. SOB (shortness of breath) on exertion [name] notes increasing shortness of breath with exercising and seems to be worsening over time with weight gain. She notes getting out of breath sooner with activity than she used to. This has not gotten worse recently. [name] denies shortness of breath at rest or orthopnea.  3. Essential hypertension Marcia Chan has had hypertension for about 20 years. She is on Triamterene and Carvedilol (on these medications for the entire time).  4. Other hyperlipidemia Marcia Chan is on pravastatin. She has had hyperlipidemia for about 10 years (on statin most of that time). She denies myalgias.  5. Vitamin D deficiency Marcia Chan is not on Vit D, and she notes fatigue.  6. At risk for osteoporosis Marcia Chan is at higher risk of osteopenia and osteoporosis due to Vitamin D deficiency.   Assessment/Plan:   1. Other fatigue Marcia Chan does feel that her weight is causing  her energy to be lower than it should be. Fatigue may be related to obesity, depression or many other causes. Labs will be ordered, and in the meanwhile, Marcia Chan will focus on self care including making healthy food choices, increasing physical activity and focusing on stress reduction.  - EKG 12-Lead - CBC with Differential/Platelet -  Hemoglobin A1c - Insulin, random - Vitamin B12 - Folate - T3 - T4 - TSH  2. SOB (shortness of breath) on exertion Marcia Chan does feel that she gets out of breath more easily that she used to when she exercises. Zahriah's shortness of breath appears to be obesity related and exercise induced. She has agreed to work on weight loss and gradually increase exercise to treat her exercise induced shortness of breath. Will continue to monitor closely.  - CBC with Differential/Platelet - Hemoglobin A1c - Insulin, random - Vitamin B12 - Folate - T3 - T4 - TSH  3. Essential hypertension Marcia Chan is working on healthy weight loss and exercise to improve blood pressure control. We will watch for signs of hypotension as she continues her lifestyle modifications. We will check labs today.  - Comprehensive metabolic panel  4. Other hyperlipidemia Cardiovascular risk and specific lipid/LDL goals reviewed. We discussed several lifestyle modifications today and Marcia Chan will continue to work on diet, exercise and weight loss efforts. We will check labs today. Orders and follow up as documented in patient record.   Counseling Intensive lifestyle modifications are the first line treatment for this issue. . Dietary changes: Increase soluble fiber. Decrease simple carbohydrates. . Exercise changes: Moderate to vigorous-intensity aerobic activity 150 minutes per week if tolerated. . Lipid-lowering medications: see documented in medical record.  - Lipid Panel With LDL/HDL Ratio  5. Vitamin D deficiency Low Vitamin D level contributes to fatigue and are associated with obesity, breast, and colon cancer. We will check labs today. Marcia Chan will follow-up for routine testing of Vitamin D, at least 2-3 times per year to avoid over-replacement.  - VITAMIN D 25 Hydroxy (Vit-D Deficiency, Fractures)  6. Depression screening Marcia Chan had a positive depression screening. Depression is commonly  associated with obesity and often results in emotional eating behaviors. We will monitor this closely and work on CBT to help improve the non-hunger eating patterns. Referral to Psychology may be required if no improvement is seen as she continues in our clinic.  7. At risk for osteoporosis Marcia Chan was given approximately 15 minutes of osteoporosis prevention counseling today. Marcia Chan is at risk for osteopenia and osteoporosis due to her Vitamin D deficiency. She was encouraged to take her Vitamin D and follow her higher calcium diet and increase strengthening exercise to help strengthen her bones and decrease her risk of osteopenia and osteoporosis.  Repetitive spaced learning was employed today to elicit superior memory formation and behavioral change.  8. Class 3 severe obesity with serious comorbidity and body mass index (BMI) of 40.0 to 44.9 in adult, unspecified obesity type (HCC) Marcia Chan is currently in the action stage of change and her goal is to continue with weight loss efforts. I recommend Marcia Chan begin the structured treatment plan as follows:  She has agreed to the Category 3 Plan.  Exercise goals: No exercise has been prescribed at this time.   Behavioral modification strategies: increasing lean protein intake, increasing vegetables, meal planning and cooking strategies, keeping healthy foods in the home and planning for success.  She was informed of the importance of frequent follow-up visits to maximize her success with intensive lifestyle modifications  for her multiple health conditions. She was informed we would discuss her lab results at her next visit unless there is a critical issue that needs to be addressed sooner. Marcia Chan agreed to keep her next visit at the agreed upon time to discuss these results.  Objective:   Blood pressure 122/82, pulse 78, temperature 98.4 F (36.9 C), temperature source Oral, height 5\' 7"  (1.702 m), weight 285 lb (129.3 kg), SpO2 99 %.  Body mass index is 44.64 kg/m.  EKG: Normal sinus rhythm, rate 72 BPM.  Indirect Calorimeter completed today shows a VO2 of 282 and a REE of 1962.  Her calculated basal metabolic rate is thus her basal metabolic rate is worse than expected.  General: Cooperative, alert, well developed, in no acute distress. HEENT: Conjunctivae and lids unremarkable. Cardiovascular: Regular rhythm.  Lungs: Normal work of breathing. Neurologic: No focal deficits.   Lab Results  Component Value Date   CREATININE 0.72 10/29/2019   BUN 11 10/29/2019   NA 138 10/29/2019   K 3.9 10/29/2019   CL 103 10/29/2019   CO2 30 10/29/2019   Lab Results  Component Value Date   ALT 13 10/29/2019   AST 13 10/29/2019   ALKPHOS 53 10/29/2019   BILITOT 0.7 10/29/2019   Lab Results  Component Value Date   HGBA1C 6.1 08/29/2019   HGBA1C 5.8 05/01/2019   HGBA1C 5.7 01/11/2018   HGBA1C 5.7 (H) 07/12/2017   HGBA1C 5.6 07/17/2016   No results found for: INSULIN Lab Results  Component Value Date   TSH 1.41 10/29/2019   Lab Results  Component Value Date   CHOL 154 10/29/2019   HDL 29.60 (L) 10/29/2019   LDLCALC 113 (H) 10/29/2019   TRIG 59.0 10/29/2019   CHOLHDL 5 10/29/2019   Lab Results  Component Value Date   WBC 6.9 10/29/2019   HGB 12.6 10/29/2019   HCT 37.3 10/29/2019   MCV 90.0 10/29/2019   PLT 231.0 10/29/2019   No results found for: IRON, TIBC, FERRITIN  Attestation Statements:   This is the patient's first visit at [facility]. The patient's NEW PATIENT PACKET was reviewed at length. Included in the packet: current and past health history, medications, allergies, ROS, gynecologic history (women only), surgical history, family history, social history, weight history, weight loss surgery history (for those that have had weight loss surgery), nutritional evaluation, mood and food questionnaire, PHQ9, Epworth questionnaire, sleep habits questionnaire, patient life and health  improvement goals questionnaire. These will all be scanned into the patient's chart under media.   During the visit, I independently reviewed the patient's EKG, bioimpedance scale results, and indirect calorimeter results. I used this information to tailor a meal plan for the patient that will help her to lose weight and will improve her obesity-related conditions going forward. I performed a medically necessary appropriate examination and/or evaluation. I discussed the assessment and treatment plan with the patient. The patient was provided an opportunity to ask questions and all were answered. The patient agreed with the plan and demonstrated an understanding of the instructions. Labs were ordered at this visit and will be reviewed at the next visit unless more critical results need to be addressed immediately. Clinical information was updated and documented in the EMR.   Time spent on visit including pre-visit chart review and post-visit care was 45 minutes.   A separate 15 minutes was spent on risk counseling (see above).    10/31/2019, am acting as transcriptionist for  Reuben Likes, MD.  I have reviewed the above documentation for accuracy and completeness, and I agree with the above. - Katherina Mires, MD

## 2020-02-19 LAB — COMPREHENSIVE METABOLIC PANEL
ALT: 14 IU/L (ref 0–32)
AST: 14 IU/L (ref 0–40)
Albumin/Globulin Ratio: 1.3 (ref 1.2–2.2)
Albumin: 4.1 g/dL (ref 3.8–4.8)
Alkaline Phosphatase: 64 IU/L (ref 48–121)
BUN/Creatinine Ratio: 13 (ref 9–23)
BUN: 10 mg/dL (ref 6–24)
Bilirubin Total: 0.6 mg/dL (ref 0.0–1.2)
CO2: 26 mmol/L (ref 20–29)
Calcium: 7.1 mg/dL — ABNORMAL LOW (ref 8.7–10.2)
Chloride: 100 mmol/L (ref 96–106)
Creatinine, Ser: 0.78 mg/dL (ref 0.57–1.00)
GFR calc Af Amer: 103 mL/min/{1.73_m2} (ref >59)
GFR calc non Af Amer: 89 mL/min/{1.73_m2} (ref >59)
Globulin, Total: 3.2 g/dL (ref 1.5–4.5)
Glucose: 110 mg/dL — ABNORMAL HIGH (ref 65–99)
Potassium: 4.4 mmol/L (ref 3.5–5.2)
Sodium: 140 mmol/L (ref 134–144)
Total Protein: 7.3 g/dL (ref 6.0–8.5)

## 2020-02-19 LAB — CBC WITH DIFFERENTIAL/PLATELET
Basophils Absolute: 0 10*3/uL (ref 0.0–0.2)
Basos: 0 %
EOS (ABSOLUTE): 0.1 10*3/uL (ref 0.0–0.4)
Eos: 2 %
Hematocrit: 39.5 % (ref 34.0–46.6)
Hemoglobin: 13.2 g/dL (ref 11.1–15.9)
Immature Grans (Abs): 0 10*3/uL (ref 0.0–0.1)
Immature Granulocytes: 0 %
Lymphocytes Absolute: 1.7 10*3/uL (ref 0.7–3.1)
Lymphs: 26 %
MCH: 30.2 pg (ref 26.6–33.0)
MCHC: 33.4 g/dL (ref 31.5–35.7)
MCV: 90 fL (ref 79–97)
Monocytes Absolute: 0.6 10*3/uL (ref 0.1–0.9)
Monocytes: 9 %
Neutrophils Absolute: 4.2 10*3/uL (ref 1.4–7.0)
Neutrophils: 63 %
Platelets: 237 10*3/uL (ref 150–450)
RBC: 4.37 x10E6/uL (ref 3.77–5.28)
RDW: 11.9 % (ref 11.7–15.4)
WBC: 6.7 10*3/uL (ref 3.4–10.8)

## 2020-02-19 LAB — LIPID PANEL WITH LDL/HDL RATIO
Cholesterol, Total: 164 mg/dL (ref 100–199)
HDL: 59 mg/dL (ref >39)
LDL Chol Calc (NIH): 94 mg/dL (ref 0–99)
LDL/HDL Ratio: 1.6 ratio (ref 0.0–3.2)
Triglycerides: 57 mg/dL (ref 0–149)
VLDL Cholesterol Cal: 11 mg/dL (ref 5–40)

## 2020-02-19 LAB — HEMOGLOBIN A1C
Est. average glucose Bld gHb Est-mCnc: 128 mg/dL
Hgb A1c MFr Bld: 6.1 % — ABNORMAL HIGH (ref 4.8–5.6)

## 2020-02-19 LAB — INSULIN, RANDOM: INSULIN: 40.1 u[IU]/mL — ABNORMAL HIGH (ref 2.6–24.9)

## 2020-02-19 LAB — T4: T4, Total: 5.2 ug/dL (ref 4.5–12.0)

## 2020-02-19 LAB — FOLATE: Folate: 7.6 ng/mL (ref >3.0)

## 2020-02-19 LAB — T3: T3, Total: 85 ng/dL (ref 71–180)

## 2020-02-19 LAB — TSH: TSH: 0.936 u[IU]/mL (ref 0.450–4.500)

## 2020-02-19 LAB — VITAMIN B12: Vitamin B-12: 521 pg/mL (ref 232–1245)

## 2020-02-19 LAB — VITAMIN D 25 HYDROXY (VIT D DEFICIENCY, FRACTURES): Vit D, 25-Hydroxy: 30.9 ng/mL (ref 30.0–100.0)

## 2020-03-03 ENCOUNTER — Ambulatory Visit (INDEPENDENT_AMBULATORY_CARE_PROVIDER_SITE_OTHER): Payer: BC Managed Care – PPO | Admitting: Family Medicine

## 2020-03-03 ENCOUNTER — Encounter (INDEPENDENT_AMBULATORY_CARE_PROVIDER_SITE_OTHER): Payer: Self-pay | Admitting: Family Medicine

## 2020-03-03 ENCOUNTER — Other Ambulatory Visit: Payer: Self-pay

## 2020-03-03 VITALS — BP 119/80 | HR 89 | Temp 98.4°F | Ht 67.0 in | Wt 290.0 lb

## 2020-03-03 DIAGNOSIS — E559 Vitamin D deficiency, unspecified: Secondary | ICD-10-CM | POA: Diagnosis not present

## 2020-03-03 DIAGNOSIS — R7303 Prediabetes: Secondary | ICD-10-CM | POA: Diagnosis not present

## 2020-03-03 DIAGNOSIS — Z6841 Body Mass Index (BMI) 40.0 and over, adult: Secondary | ICD-10-CM

## 2020-03-03 DIAGNOSIS — Z9189 Other specified personal risk factors, not elsewhere classified: Secondary | ICD-10-CM | POA: Diagnosis not present

## 2020-03-03 MED ORDER — VITAMIN D (ERGOCALCIFEROL) 1.25 MG (50000 UNIT) PO CAPS: 50000.0000 [IU] | ORAL_CAPSULE | ORAL | 0 refills | Status: DC

## 2020-03-04 NOTE — Progress Notes (Signed)
Chief Complaint:   OBESITY Marcia Chan is here to discuss her progress with her obesity treatment plan along with follow-up of her obesity related diagnoses. Marcia Chan is on the Category 3 Plan and states she is following her eating plan approximately 30% of the time. Marcia Chan states she is walking around the classroom.   Today's visit was #: 2 Starting weight: 285 lbs Starting date: 02/18/2020 Today's weight: 290 lbs Today's date: 03/03/2020 Total lbs lost to date: 0 Total lbs lost since last in-office visit: 0  Interim History: Marcia Chan has had a really rough few weeks, school started and she has been exhausted so it has been hard to follow the meal plan. Breakfast is occasional if she is hungry. Her lunch is around 1045a, and depending on what she eats she isn't as hungry after school.  Subjective:   1. Vitamin D deficiency Marcia Chan's last  Vit D level was 30.9. She notes fatigue. She is not on Vit D. I discussed labs with the patient today.  2. Hypocalcemia Marcia Chan's last Ca was 7.1. She has no labs of Mg, PTH, and Vit D was low. She denies symptoms of muscle weakness. I discussed labs with the patient today.  3. Pre-diabetes Marcia Chan has been pre-diabetic for 2 years. She does report still gravitating toward carbohydrates. Last A1c was 6.1 and insulin 40.1. I discussed labs with the patient today.  4. At risk for diabetes mellitus Marcia Chan is at higher than average risk for developing diabetes due to her obesity.   Assessment/Plan:   1. Vitamin D deficiency Low Vitamin D level contributes to fatigue and are associated with obesity, breast, and colon cancer. Marcia Chan agreed to start prescription Vitamin D 50,000 IU every week with no refill. She will follow-up for routine testing of Vitamin D, at least 2-3 times per year to avoid over-replacement.  - Vitamin D, Ergocalciferol, (DRISDOL) 1.25 MG (50000 UNIT) CAPS capsule; Take 1 capsule (50,000 Units total) by mouth  every 7 (seven) days.  Dispense: 4 capsule; Refill: 0  2. Hypocalcemia We will repeat labs at [facility] next appointment.  3. Pre-diabetes Marcia Chan will continue to work on weight loss, exercise, and decreasing simple carbohydrates to help decrease the risk of diabetes. If her weight loss stalls or hunger increases then we will discuss initiation of medications.  4. At risk for diabetes mellitus Marcia Chan was given approximately 25 minutes of diabetes education and counseling today. We discussed intensive lifestyle modifications today with an emphasis on weight loss as well as increasing exercise and decreasing simple carbohydrates in her diet. We also reviewed medication options with an emphasis on risk versus benefit of those discussed.   Repetitive spaced learning was employed today to elicit superior memory formation and behavioral change.  5. Class 3 severe obesity with serious comorbidity and body mass index (BMI) of 45.0 to 49.9 in adult, unspecified obesity type (HCC) Marcia Chan is currently in the action stage of change. As such, her goal is to continue with weight loss efforts. She has agreed to the Category 3 Plan.   Exercise goals: All adults should avoid inactivity. Some physical activity is better than none, and adults who participate in any amount of physical activity gain some health benefits.  Behavioral modification strategies: increasing lean protein intake, increasing vegetables, meal planning and cooking strategies, keeping healthy foods in the home and planning for success.  Marcia Chan has agreed to follow-up with our clinic in 2 weeks. She was informed of the importance of frequent follow-up visits  to maximize her success with intensive lifestyle modifications for her multiple health conditions.   Objective:   Blood pressure 119/80, pulse 89, temperature 98.4 F (36.9 C), temperature source Oral, height 5\' 7"  (1.702 m), weight 290 lb (131.5 kg), SpO2 99 %. Body mass  index is 45.42 kg/m.  General: Cooperative, alert, well developed, in no acute distress. HEENT: Conjunctivae and lids unremarkable. Cardiovascular: Regular rhythm.  Lungs: Normal work of breathing. Neurologic: No focal deficits.   Lab Results  Component Value Date   CREATININE 0.78 02/18/2020   BUN 10 02/18/2020   NA 140 02/18/2020   K 4.4 02/18/2020   CL 100 02/18/2020   CO2 26 02/18/2020   Lab Results  Component Value Date   ALT 14 02/18/2020   AST 14 02/18/2020   ALKPHOS 64 02/18/2020   BILITOT 0.6 02/18/2020   Lab Results  Component Value Date   HGBA1C 6.1 (H) 02/18/2020   HGBA1C 6.1 08/29/2019   HGBA1C 5.8 05/01/2019   HGBA1C 5.7 01/11/2018   HGBA1C 5.7 (H) 07/12/2017   Lab Results  Component Value Date   INSULIN 40.1 (H) 02/18/2020   Lab Results  Component Value Date   TSH 0.936 02/18/2020   Lab Results  Component Value Date   CHOL 164 02/18/2020   HDL 59 02/18/2020   LDLCALC 94 02/18/2020   TRIG 57 02/18/2020   CHOLHDL 5 10/29/2019   Lab Results  Component Value Date   WBC 6.7 02/18/2020   HGB 13.2 02/18/2020   HCT 39.5 02/18/2020   MCV 90 02/18/2020   PLT 237 02/18/2020   No results found for: IRON, TIBC, FERRITIN  Attestation Statements:   Reviewed by clinician on day of visit: allergies, medications, problem list, medical history, surgical history, family history, social history, and previous encounter notes.   I, 02/20/2020, am acting as transcriptionist for Burt Knack, MD.  I have reviewed the above documentation for accuracy and completeness, and I agree with the above. - Reuben Likes, MD

## 2020-03-24 ENCOUNTER — Ambulatory Visit (INDEPENDENT_AMBULATORY_CARE_PROVIDER_SITE_OTHER): Payer: BC Managed Care – PPO | Admitting: Family Medicine

## 2020-03-31 ENCOUNTER — Encounter (INDEPENDENT_AMBULATORY_CARE_PROVIDER_SITE_OTHER): Payer: Self-pay

## 2020-03-31 ENCOUNTER — Ambulatory Visit (INDEPENDENT_AMBULATORY_CARE_PROVIDER_SITE_OTHER): Payer: BC Managed Care – PPO | Admitting: Family Medicine

## 2020-04-18 ENCOUNTER — Other Ambulatory Visit: Payer: Self-pay | Admitting: Family Medicine

## 2020-05-07 ENCOUNTER — Encounter: Payer: Self-pay | Admitting: Family Medicine

## 2020-05-07 ENCOUNTER — Ambulatory Visit (INDEPENDENT_AMBULATORY_CARE_PROVIDER_SITE_OTHER): Payer: BC Managed Care – PPO | Admitting: Family Medicine

## 2020-05-07 ENCOUNTER — Other Ambulatory Visit: Payer: Self-pay

## 2020-05-07 VITALS — BP 138/82 | HR 67 | Temp 97.1°F | Resp 16 | Ht 67.0 in | Wt 291.0 lb

## 2020-05-07 DIAGNOSIS — M25551 Pain in right hip: Secondary | ICD-10-CM | POA: Diagnosis not present

## 2020-05-07 DIAGNOSIS — Z0000 Encounter for general adult medical examination without abnormal findings: Secondary | ICD-10-CM

## 2020-05-07 DIAGNOSIS — Z0001 Encounter for general adult medical examination with abnormal findings: Secondary | ICD-10-CM | POA: Diagnosis not present

## 2020-05-07 DIAGNOSIS — E559 Vitamin D deficiency, unspecified: Secondary | ICD-10-CM | POA: Diagnosis not present

## 2020-05-07 DIAGNOSIS — Z23 Encounter for immunization: Secondary | ICD-10-CM | POA: Diagnosis not present

## 2020-05-07 LAB — LIPID PANEL
Cholesterol: 155 mg/dL (ref 0–200)
HDL: 31.5 mg/dL — ABNORMAL LOW (ref >39.00)
LDL Cholesterol: 112 mg/dL — ABNORMAL HIGH (ref 0–99)
NonHDL: 123.87
Total CHOL/HDL Ratio: 5
Triglycerides: 59 mg/dL (ref 0.0–149.0)
VLDL: 11.8 mg/dL (ref 0.0–40.0)

## 2020-05-07 LAB — BASIC METABOLIC PANEL
BUN: 11 mg/dL (ref 6–23)
CO2: 31 mEq/L (ref 19–32)
Calcium: 9.1 mg/dL (ref 8.4–10.5)
Chloride: 102 mEq/L (ref 96–112)
Creatinine, Ser: 0.71 mg/dL (ref 0.40–1.20)
GFR: 99.26 mL/min (ref >60.00)
Glucose, Bld: 91 mg/dL (ref 70–99)
Potassium: 3.9 mEq/L (ref 3.5–5.1)
Sodium: 139 mEq/L (ref 135–145)

## 2020-05-07 LAB — CBC WITH DIFFERENTIAL/PLATELET
Basophils Absolute: 0 10*3/uL (ref 0.0–0.1)
Basophils Relative: 0.6 % (ref 0.0–3.0)
Eosinophils Absolute: 0 10*3/uL (ref 0.0–0.7)
Eosinophils Relative: 0.7 % (ref 0.0–5.0)
HCT: 37.3 % (ref 36.0–46.0)
Hemoglobin: 12.4 g/dL (ref 12.0–15.0)
Lymphocytes Relative: 26.3 % (ref 12.0–46.0)
Lymphs Abs: 1.7 10*3/uL (ref 0.7–4.0)
MCHC: 33.1 g/dL (ref 30.0–36.0)
MCV: 89.7 fl (ref 78.0–100.0)
Monocytes Absolute: 0.7 10*3/uL (ref 0.1–1.0)
Monocytes Relative: 10.6 % (ref 3.0–12.0)
Neutro Abs: 4 10*3/uL (ref 1.4–7.7)
Neutrophils Relative %: 61.8 % (ref 43.0–77.0)
Platelets: 229 10*3/uL (ref 150.0–400.0)
RBC: 4.16 Mil/uL (ref 3.87–5.11)
RDW: 13.1 % (ref 11.5–15.5)
WBC: 6.6 10*3/uL (ref 4.0–10.5)

## 2020-05-07 LAB — HEPATIC FUNCTION PANEL
ALT: 13 U/L (ref 0–35)
AST: 11 U/L (ref 0–37)
Albumin: 4.1 g/dL (ref 3.5–5.2)
Alkaline Phosphatase: 50 U/L (ref 39–117)
Bilirubin, Direct: 0.2 mg/dL (ref 0.0–0.3)
Total Bilirubin: 0.7 mg/dL (ref 0.2–1.2)
Total Protein: 7 g/dL (ref 6.0–8.3)

## 2020-05-07 LAB — TSH: TSH: 1.83 u[IU]/mL (ref 0.35–4.50)

## 2020-05-07 LAB — VITAMIN D 25 HYDROXY (VIT D DEFICIENCY, FRACTURES): VITD: 24.66 ng/mL — ABNORMAL LOW (ref 30.00–100.00)

## 2020-05-07 MED ORDER — PREDNISONE 10 MG PO TABS
ORAL_TABLET | ORAL | 0 refills | Status: DC
Start: 2020-05-07 — End: 2020-11-16

## 2020-05-07 NOTE — Progress Notes (Signed)
Subjective:    Patient ID: Marcia Chan, female    DOB: 1970/09/21, 49 y.o.   MRN: [id number]  HPI CPE- UTD on pap, mammo, colonoscopy, COVID vaccines, Tdap.  Will get flu shot today.  Reviewed past medical, surgical, family and social histories.   Patient Care Team    Relationship Specialty Notifications Start End  Sheliah Hatch, MD PCP - General Family Medicine  02/10/11   Lynnea Ferrier., MD Referring Physician Obstetrics and Gynecology  06/03/15     Health Maintenance  Topic Date Due  . Hepatitis C Screening  Never done  . INFLUENZA VACCINE  02/01/2020  . HIV Screening  10/28/2020 (Originally [birth year]/1985)  . MAMMOGRAM  08/18/2020  . PAP SMEAR-Modifier  07/30/2022  . COLONOSCOPY  01/10/2024  . TETANUS/TDAP  04/17/2024  . COVID-19 Vaccine  Completed      Review of Systems Patient reports no vision/ hearing changes, adenopathy,fever, weight change,  persistant/recurrent hoarseness , swallowing issues, chest pain, palpitations, edema, persistant/recurrent cough, hemoptysis, dyspnea (rest/exertional/paroxysmal nocturnal), gastrointestinal bleeding (melena, rectal bleeding), abdominal pain, significant heartburn, bowel changes, GU symptoms (dysuria, hematuria, incontinence), Gyn symptoms (abnormal  bleeding, pain),  syncope, focal weakness, memory loss, numbness & tingling, skin/hair/nail changes, abnormal bruising or bleeding, anxiety, or depression.   + R hip pain- had xray that indicated mild arthritis.  Saw ortho and was given pain meds.  Pt reports she needs new ortho referral b/c she is not able to walk.  At night, 'it feels like an elephant is on my leg'- R thigh.  Has pain in groin, thigh, radiates around to back.  Has been unable to work, unable to sleep.  'i'm just tired of taking all these pain pills'- previously on Diclofenac, Nabumetone   This visit occurred during the SARS-CoV-2 public health emergency.  Safety protocols were in place, including screening  questions prior to the visit, additional usage of staff PPE, and extensive cleaning of exam room while observing appropriate contact time as indicated for disinfecting solutions.       Objective:   Physical Exam General Appearance:    Alert, cooperative, no distress, appears stated age, obese  Head:    Normocephalic, without obvious abnormality, atraumatic  Eyes:    PERRL, conjunctiva/corneas clear, EOM's intact, fundi    benign, both eyes  Ears:    Normal TM's and external ear canals, both ears  Nose:   Deferred due to COVID  Throat:   Neck:   Supple, symmetrical, trachea midline, no adenopathy;    Thyroid: no enlargement/tenderness/nodules  Back:     Symmetric, no curvature, ROM normal, no CVA tenderness  Lungs:     Clear to auscultation bilaterally, respirations unlabored  Chest Wall:    No tenderness or deformity   Heart:    Regular rate and rhythm, S1 and S2 normal, no murmur, rub   or gallop  Breast Exam:    Deferred to GYN  Abdomen:     Soft, non-tender, bowel sounds active all four quadrants,    no masses, no organomegaly  Genitalia:    Deferred to GYN  Rectal:    Extremities:   Extremities normal, atraumatic, no cyanosis or edema  Pulses:   2+ and symmetric all extremities  Skin:   Skin color, texture, turgor normal, no rashes or lesions  Lymph nodes:   Cervical, supraclavicular, and axillary nodes normal  Neurologic:   CNII-XII intact, normal strength, sensation and reflexes    Throughout.  + antalgic gait  Assessment & Plan:  R hip pain- new to provider, ongoing for pt.  She is not happy with the response of her current orthopedic provider.  Will refer to new Ortho office and start Prednisone taper to improve pain.  Pt expressed understanding and is in agreement w/ plan.

## 2020-05-07 NOTE — Patient Instructions (Signed)
Follow up in 6 months to recheck BP and cholesterol We'll notify you of your lab results and make any changes if needed We'll call you with your Ortho appt START the Prednisone as directed- take w/ food ICE!!! Call with any questions or concerns Hang in there!!

## 2020-05-10 ENCOUNTER — Other Ambulatory Visit: Payer: Self-pay

## 2020-05-10 DIAGNOSIS — E559 Vitamin D deficiency, unspecified: Secondary | ICD-10-CM

## 2020-05-10 MED ORDER — VITAMIN D (ERGOCALCIFEROL) 1.25 MG (50000 UNIT) PO CAPS: 50000.0000 [IU] | ORAL_CAPSULE | ORAL | 0 refills | Status: DC

## 2020-05-11 NOTE — Assessment & Plan Note (Signed)
Ongoing issue for pt.  BMI is now 45.58.  She is not able to exercise due to hip pain which is problematic for her.  Check labs to risk stratify.  Will follow.

## 2020-05-11 NOTE — Assessment & Plan Note (Signed)
Pt's PE WNL w/ exception of obesity and antalgic gait.  UTD on pap, mammo, colonoscopy, COVID and Tdap vaccines.  Flu shot given today.  Check labs.  Anticipatory guidance provided.

## 2020-05-11 NOTE — Assessment & Plan Note (Signed)
Check labs and replete prn. 

## 2020-05-13 ENCOUNTER — Ambulatory Visit: Payer: BC Managed Care – PPO | Admitting: Physician Assistant

## 2020-05-13 ENCOUNTER — Encounter: Payer: Self-pay | Admitting: Physician Assistant

## 2020-05-13 DIAGNOSIS — M1611 Unilateral primary osteoarthritis, right hip: Secondary | ICD-10-CM

## 2020-05-13 NOTE — Progress Notes (Signed)
Office Visit Note   Patient: Marcia Chan           Date of Birth: September 09, 1970           MRN: [id number] Visit Date: 05/13/2020              Requested by: Sheliah Hatch, MD [address] [place],  [place] [address] PCP: Sheliah Hatch, MD   Assessment & Plan: Visit Diagnoses:  1. Primary osteoarthritis of right hip     Plan: Patient would like to proceed with any conservative measures.  Discussed with her using a cane in her left hand.  We will have her undergo an intra-articular injection under ultrasound with Dr. Prince Rome after 05/30/2020 due to Covid booster that sometime in the coming Friday.  Then she will follow-up with Dr. Magnus Ivan 4 weeks after that injection.  Questions were encouraged and answered at length by Dr. Magnus Ivan and myself.  Follow-Up Instructions: Return 4 weeks after intra-articular injection.   Orders:  No orders of the defined types were placed in this encounter.  No orders of the defined types were placed in this encounter.     Procedures: No procedures performed   Clinical Data: No additional findings.   Subjective: Chief Complaint  Patient presents with  . Right Hip - Pain    HPI Marcia Chan is a 49 year old female were seen for right hip pain since ongoing for the past 4 years.  She states is constant pain does awaken her at night.  Is deep within her right hip groin region.  She has difficulty walking due to the pain.  Pain does radiate down the front of her leg to the knee area.  No back pain.  She denies any numbness tingling down the leg.  She states that she has been given pain medicine for this.  She is currently taking a Medrol Dosepak which is taken the edge off of the pain.  She had no known injury to the hip.  She notes that the right hip does give way on her at times.  She is a Midwife.  Patient is nondiabetic. Radiographs AP pelvis and right hip lateral view shows no acute fracture.  Narrowing of the  right hip joint compared to the left.  Right hip narrowing is moderate.  There is sclerotic changes in the acetabulum.  No acute fracture.  Bilateral hips are well located.  Review of Systems Negative for fevers or chills.  See HPI otherwise negative  Objective: Vital Signs: There were no vitals taken for this visit.  Physical Exam Constitutional:      Appearance: She is not ill-appearing or diaphoretic.  Pulmonary:     Effort: Pulmonary effort is normal.  Neurological:     Mental Status: She is alert and oriented to person, place, and time.  Psychiatric:        Mood and Affect: Mood normal.     Ortho Exam Left hip excellent range of motion without pain.  Right hip she has significant pain with any attempts of internal and external rotation.  She also guards due to the pain in the right hip with attempts of range of motion.  With the patient lying down gentle range of motion reveals the right hip to have fluid motion but again pain with motion.  Dr. Magnus Ivan examined patient for possible future surgical consideration if conservative treatment fails and feels that her anatomy lends itself to right total hip arthroplasty.  Specialty Comments:  No specialty comments available.  Imaging: No results found.   PMFS History: Patient Active Problem List   Diagnosis Date Noted  . Palpitations 09/19/2019  . Morbid obesity (HCC) 09/19/2019  . Cardiac murmur 09/19/2019  . Anxiety 05/01/2019  . Primary osteoarthritis of both knees 08/30/2018  . Vitamin D deficiency 12/14/2016  . Plantar fasciitis of left foot 01/06/2014  . Numbness and tingling of left leg 06/09/2013  . Lumbar back pain 12/23/2012  . Lateral epicondylitis 12/23/2012  . GERD (gastroesophageal reflux disease) 05/03/2012  . Seasonal allergic rhinitis 10/08/2011  . General medical examination 10/06/2011  . Hair loss 02/21/2011  . HTN (hypertension) 02/10/2011  . Hyperlipidemia 02/10/2011  . Nail fungus 02/10/2011    Past Medical History:  Diagnosis Date  . Anxiety 05/01/2019  . Back pain   . Bilateral swelling of feet   . Constipation   . Cough 06/21/2011  . General medical examination 10/06/2011  . GERD (gastroesophageal reflux disease)   . Hair loss 02/21/2011  . HTN (hypertension) 02/10/2011  . Hyperlipidemia   . Hypertension   . Joint pain   . Lactose intolerance   . Lateral epicondylitis 12/23/2012  . Lumbar back pain 12/23/2012  . Nail fungus 02/10/2011  . Numbness and tingling of left leg 06/09/2013  . Plantar fasciitis of left foot 01/06/2014  . Primary osteoarthritis of both knees 08/30/2018   Bilateral mild to moderate osteoarthritis and mild chondromalacia patella  . Seasonal allergic rhinitis 10/08/2011  . Severe obesity (BMI >= 40) (HCC) 02/10/2011  . Vertigo   . Vitamin D deficiency 12/14/2016    Family History  Problem Relation Age of Onset  . Arthritis Mother        OA/DJD  . Heart disease Mother   . Diabetes Mother   . Hypertension Mother   . Hyperlipidemia Mother   . Obesity Mother   . Cancer Father   . Hypertension Father   . Hyperlipidemia Father   . Sleep apnea Father   . Colon cancer Neg Hx     Past Surgical History:  Procedure Laterality Date  . DILATION AND CURETTAGE OF UTERUS  2002, 2003   Social History   Occupational History  . Occupation: Runner, broadcasting/film/video  Tobacco Use  . Smoking status: Never Smoker  . Smokeless tobacco: Never Used  Vaping Use  . Vaping Use: Never used  Substance and Sexual Activity  . Alcohol use: No  . Drug use: No  . Sexual activity: Not on file

## 2020-05-31 ENCOUNTER — Ambulatory Visit: Payer: BC Managed Care – PPO | Admitting: Family Medicine

## 2020-05-31 ENCOUNTER — Other Ambulatory Visit: Payer: Self-pay

## 2020-05-31 ENCOUNTER — Ambulatory Visit: Payer: Self-pay

## 2020-05-31 ENCOUNTER — Encounter: Payer: Self-pay | Admitting: Family Medicine

## 2020-05-31 DIAGNOSIS — M1611 Unilateral primary osteoarthritis, right hip: Secondary | ICD-10-CM | POA: Diagnosis not present

## 2020-05-31 NOTE — Patient Instructions (Signed)
   Glucosamine Sulfate:  1,000 mg twice daily  Turmeric:  500 mg twice daily   

## 2020-05-31 NOTE — Progress Notes (Signed)
Subjective: Patient is here for ultrasound-guided intra-articular right hip injection.   Chronic pain in the groin area.  Objective: Pain with passive internal rotation.  Procedure: Ultrasound guided injection is preferred based studies that show increased duration, increased effect, greater accuracy, decreased procedural pain, increased response rate, and decreased cost with ultrasound guided versus blind injection.   Verbal informed consent obtained.  Time-out conducted.  Noted no overlying erythema, induration, or other signs of local infection. Ultrasound-guided right hip injection: After sterile prep with Betadine, injected 8 cc 1% lidocaine without epinephrine and 40 mg methylprednisolone using a 22-gauge spinal needle, passing the needle through the iliofemoral ligament into the femoral head/neck junction.  Injectate seen filling the joint capsule.  Good immediate relief.

## 2020-07-15 DIAGNOSIS — M25551 Pain in right hip: Secondary | ICD-10-CM | POA: Insufficient documentation

## 2020-07-17 ENCOUNTER — Other Ambulatory Visit: Payer: Self-pay | Admitting: Family Medicine

## 2020-07-22 ENCOUNTER — Encounter: Payer: Self-pay | Admitting: Family Medicine

## 2020-07-22 ENCOUNTER — Telehealth (INDEPENDENT_AMBULATORY_CARE_PROVIDER_SITE_OTHER): Payer: BC Managed Care – PPO | Admitting: Family Medicine

## 2020-07-22 DIAGNOSIS — R739 Hyperglycemia, unspecified: Secondary | ICD-10-CM

## 2020-07-22 NOTE — Progress Notes (Signed)
Virtual Visit via Video   I connected with patient on 07/22/20 at  1:30 PM EST by a video enabled telemedicine application and verified that I am speaking with the correct person using two identifiers.  Location patient: Home Location provider: [facility], [facility] Persons participating in the virtual visit: Patient, Provider, CMA (Sabrina M)  I discussed the limitations of evaluation and management by telemedicine and the availability of in person appointments. The patient expressed understanding and agreed to proceed.  Subjective:   HPI:   Elevated blood sugar- pt had a hip injxn on Saturday.  Developed HA on Monday that lasted through Tuesday.  Increased thirst, increased urination.  Home CBG 188 prior to bed.  Last A1C 6.1  Pt is worried that due to increased thirst, urination, headache, and strange taste in mouth that she is now diabetic.  ROS:   See pertinent positives and negatives per HPI.  Patient Active Problem List   Diagnosis Date Noted  . Pain in joint of right hip 07/15/2020  . Palpitations 09/19/2019  . Morbid obesity (HCC) 09/19/2019  . Cardiac murmur 09/19/2019  . Anxiety 05/01/2019  . Primary osteoarthritis of both knees 08/30/2018  . Vitamin D deficiency 12/14/2016  . Plantar fasciitis of left foot 01/06/2014  . Numbness and tingling of left leg 06/09/2013  . Lumbar back pain 12/23/2012  . Lateral epicondylitis 12/23/2012  . GERD (gastroesophageal reflux disease) 05/03/2012  . Seasonal allergic rhinitis 10/08/2011  . General medical examination 10/06/2011  . Hair loss 02/21/2011  . HTN (hypertension) 02/10/2011  . Hyperlipidemia 02/10/2011  . Nail fungus 02/10/2011    Social History   Tobacco Use  . Smoking status: Never Smoker  . Smokeless tobacco: Never Used  Substance Use Topics  . Alcohol use: No    Current Outpatient Medications:  .  carvedilol (COREG) 6.25 MG tablet, TAKE 1 TABLET BY MOUTH TWICE DAILY WITH A MEAL, Disp: 180  tablet, Rfl: 0 .  levonorgestrel (MIRENA) 20 MCG/24HR IUD, 1 each by Intrauterine route once., Disp: , Rfl:  .  medroxyPROGESTERone (PROVERA) 10 MG tablet, Take 10 mg by mouth daily., Disp: , Rfl:  .  pantoprazole (PROTONIX) 40 MG tablet, TAKE 1 TABLET(40 MG) BY MOUTH TWICE DAILY, Disp: 180 tablet, Rfl: 0 .  pravastatin (PRAVACHOL) 40 MG tablet, TAKE 1 TABLET(40 MG) BY MOUTH DAILY, Disp: 90 tablet, Rfl: 1 .  triamterene-hydrochlorothiazide (MAXZIDE-25) 37.5-25 MG tablet, TAKE 1 TABLET BY MOUTH DAILY, Disp: 90 tablet, Rfl: 0 .  Vitamin D, Ergocalciferol, (DRISDOL) 1.25 MG (50000 UNIT) CAPS capsule, Take 1 capsule (50,000 Units total) by mouth every 7 (seven) days., Disp: 12 capsule, Rfl: 0 .  nystatin cream (MYCOSTATIN), Apply under breasts daily (Patient not taking: Reported on 07/22/2020), Disp: , Rfl:  .  predniSONE (DELTASONE) 10 MG tablet, 3 tabs x3 days and then 2 tabs x3 days and then 1 tab x3 days.  Take w/ food. (Patient not taking: Reported on 07/22/2020), Disp: 18 tablet, Rfl: 0  Allergies  Allergen Reactions  . Bextra [Valdecoxib]     Rash     Objective:   There were no vitals taken for this visit. AAOx3, NAD obese NCAT, EOMI No obvious CN deficits Coloring WNL Pt is able to speak clearly, coherently without shortness of breath or increased work of breathing.  Thought process is linear.  Mood is appropriate.   Assessment and Plan:   Elevated blood sugar- new.  Pt had steroid injxn in hip on Saturday and since then  has had HA, elevated blood sugar, increased urination, and increased thirst.  Explained that steroids are known to raise blood sugar so the question is whether this is a temporary increase or has this tipped her from pre-diabetes to diabetes.  Will check A1C at lab visit to assess.  Also encouraged her to be very mindful of her carb intake to help offset the increased sugars caused by the steroids.  Pt expressed understanding and is in agreement w/ plan.     Neena Rhymes, MD 07/22/2020

## 2020-07-22 NOTE — Progress Notes (Signed)
I connected with  Marcia Chan on 07/22/20 by a video enabled telemedicine application and verified that I am speaking with the correct person using two identifiers.   I discussed the limitations of evaluation and management by telemedicine. The patient expressed understanding and agreed to proceed.

## 2020-08-07 IMAGING — CT CT ABDOMEN AND PELVIS WITH CONTRAST
2 of 5 series · 17 of 46 positions shown, 19 images · IV contrast (APPLIED)
Comparison: None.

CLINICAL DATA: History of recent colonoscopy with distension and
left lower quadrant pain

EXAM:
CT ABDOMEN AND PELVIS WITH CONTRAST
TECHNIQUE: Multidetector CT imaging of the abdomen and pelvis was performed
using the standard protocol following bolus administration of
intravenous contrast.
CONTRAST:  100mL OMNIPAQUE IOHEXOL 300 MG/ML  SOLN

[Series 2: axial st · axial · 0.90mm/px · z∈[-456,-26]mm · 14 of 96 slices shown, 16 images]
[im 5/96  soft-tissue]
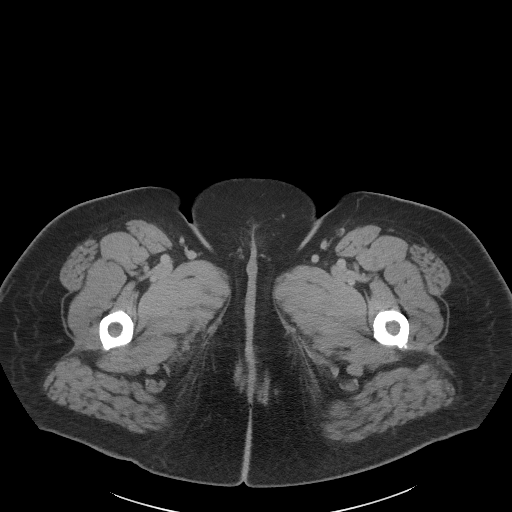
[im 5/96  bone]
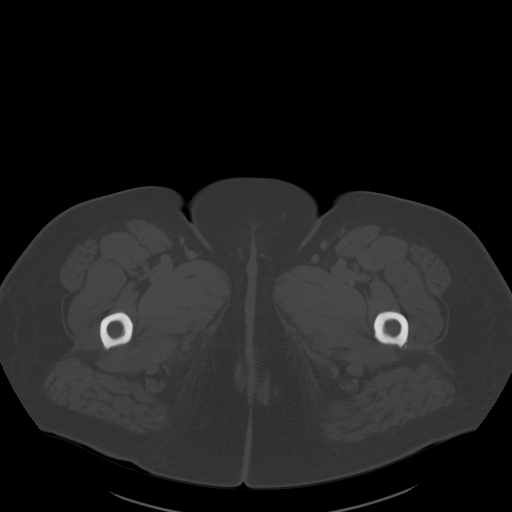
[im 15/96  soft-tissue]
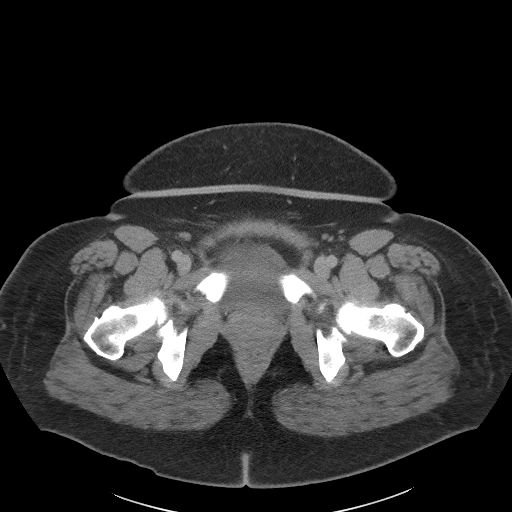
[im 20/96  soft-tissue]
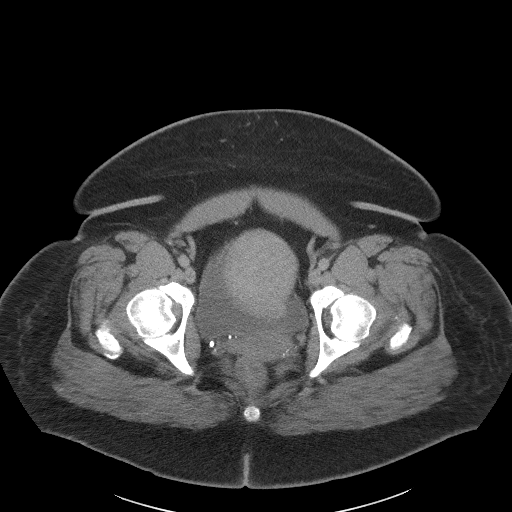
[im 24/96  soft-tissue]
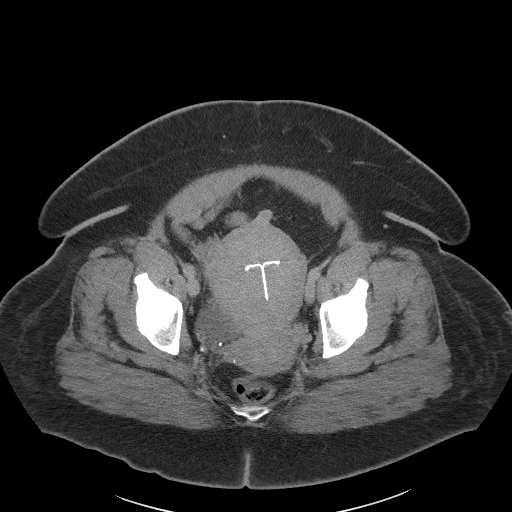
[im 34/96  soft-tissue]
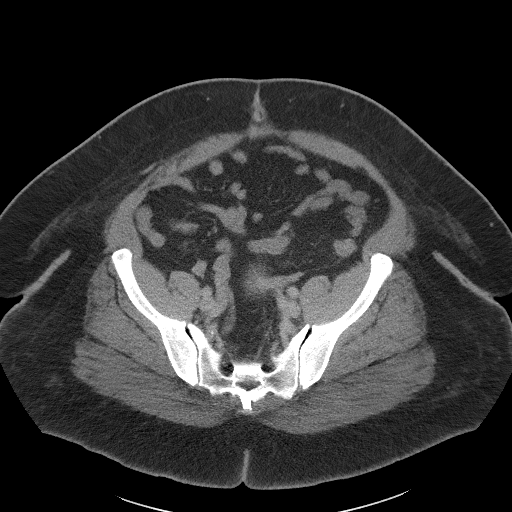
[im 39/96  soft-tissue]
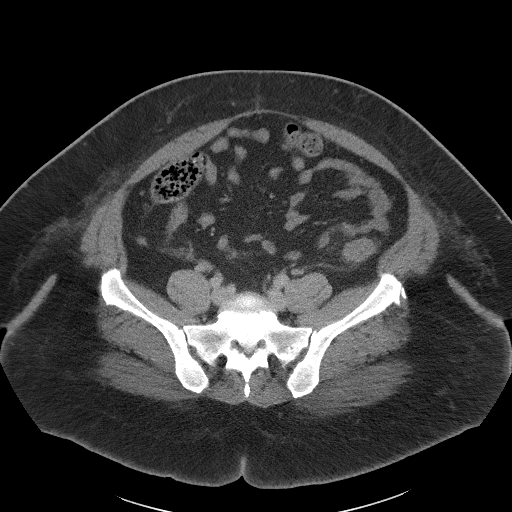
[im 43/96  soft-tissue]
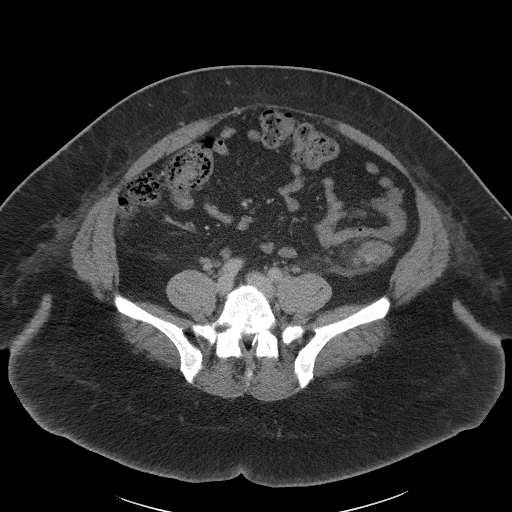
[im 53/96  soft-tissue]
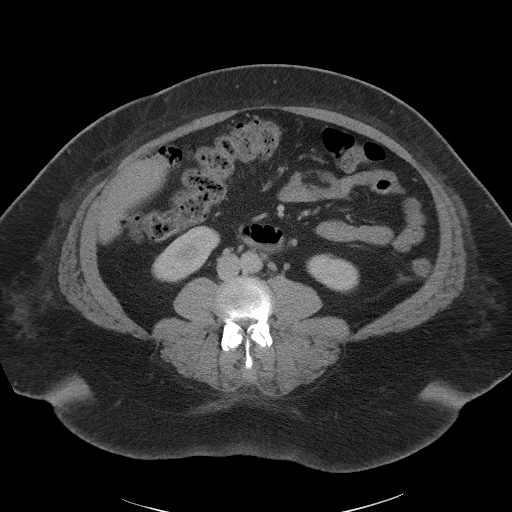
[im 58/96  soft-tissue]
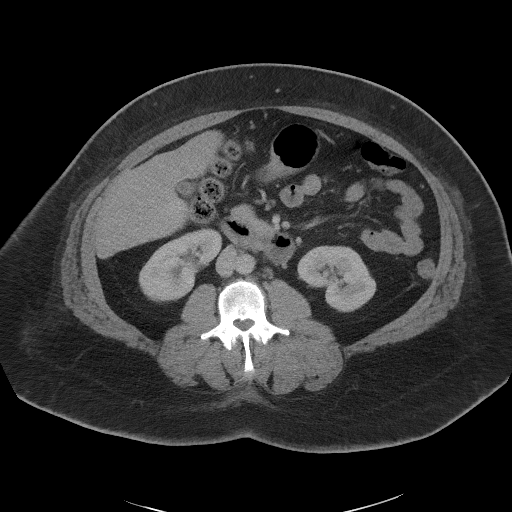
[im 58/96  bone]
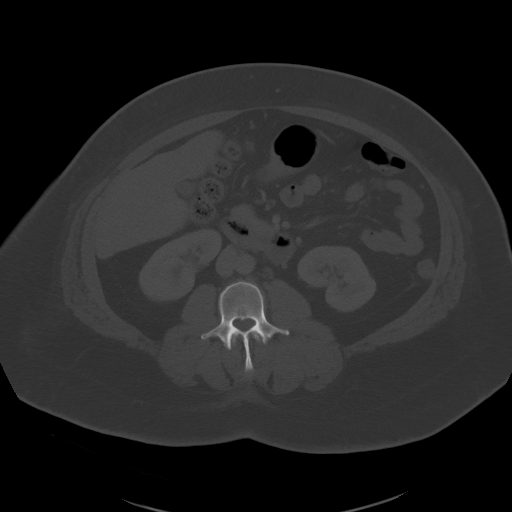
[im 62/96  soft-tissue]
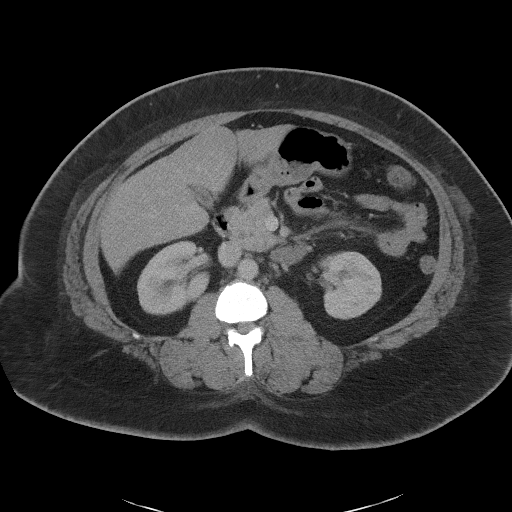
[im 72/96  soft-tissue]
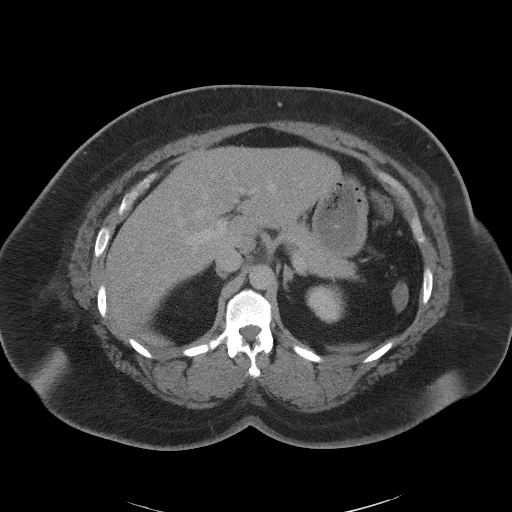
[im 77/96  soft-tissue]
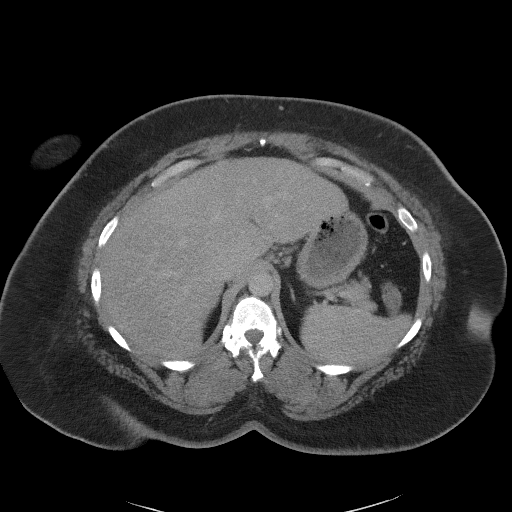
[im 81/96  soft-tissue]
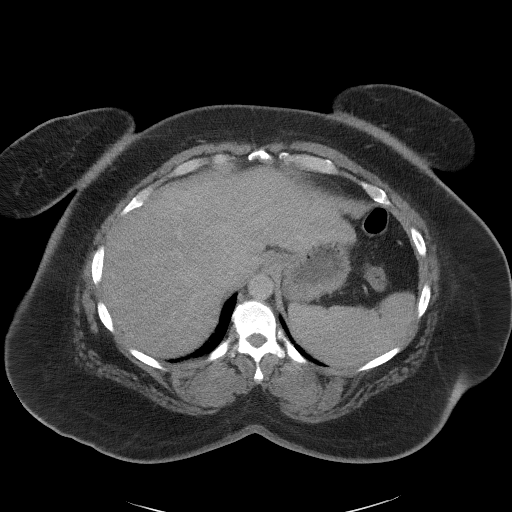
[im 91/96  soft-tissue]
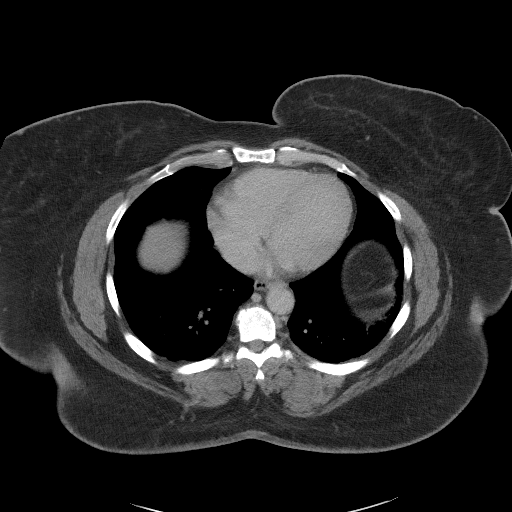

[Series 5: coronal st · coronal · 0.97mm/px · 3 of 119 slices shown]
[im 40/119  soft-tissue]
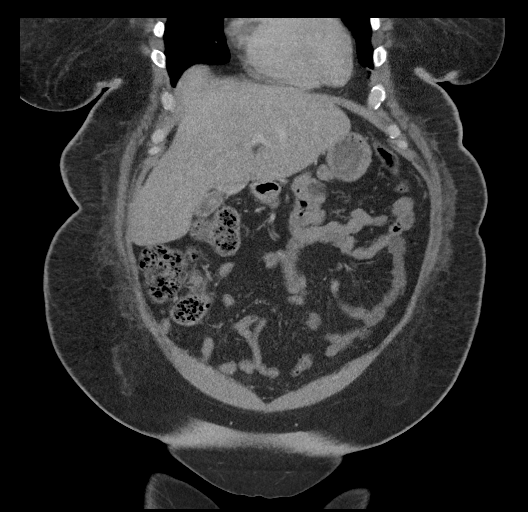
[im 53/119  soft-tissue]
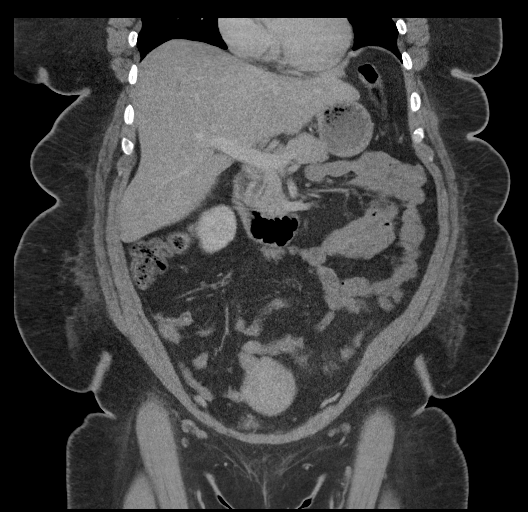
[im 66/119  soft-tissue]
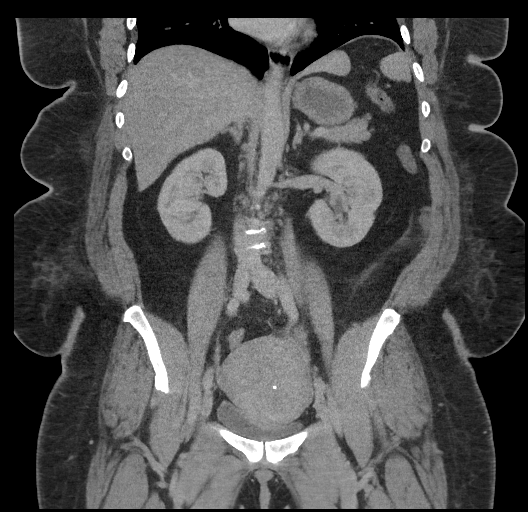

[17 of 46 positions shown; findings below may reference images not displayed]

FINDINGS: Lower chest: No acute abnormality.

Hepatobiliary: Mild fatty infiltration of the liver is noted.
Gallbladder is within normal limits.

Pancreas: Unremarkable. No pancreatic ductal dilatation or
surrounding inflammatory changes.

Spleen: Normal in size without focal abnormality.

Adrenals/Urinary Tract: Adrenal glands are unremarkable. Kidneys are
normal, without renal calculi, focal lesion, or hydronephrosis.
Bladder is unremarkable.

Stomach/Bowel: There are changes consistent with diverticulitis at
the junction of the descending and sigmoid colons. A tiny focus of
air is noted consistent with an air-filled diverticulum best seen on
image number 62 of series 2. The more proximal colon is within
normal limits. The appendix is unremarkable. No small bowel
abnormality is seen. The stomach is within normal limits.

Vascular/Lymphatic: No significant vascular findings are present. No
enlarged abdominal or pelvic lymph nodes.

Reproductive: Uterus is well visualized within IUD in place. No
adnexal mass is seen.

Other: No abdominal wall hernia or abnormality. No abdominopelvic
ascites.

Musculoskeletal: No acute or significant osseous findings.
IMPRESSION: Changes consistent with diverticulitis in the junction of the
descending and sigmoid colon. No abscess or perforation is noted.

## 2020-08-24 ENCOUNTER — Encounter: Payer: Self-pay | Admitting: Emergency Medicine

## 2020-08-24 LAB — HM MAMMOGRAPHY

## 2020-08-25 ENCOUNTER — Telehealth (INDEPENDENT_AMBULATORY_CARE_PROVIDER_SITE_OTHER): Payer: BC Managed Care – PPO | Admitting: Family Medicine

## 2020-08-25 ENCOUNTER — Other Ambulatory Visit: Payer: Self-pay

## 2020-08-25 ENCOUNTER — Encounter: Payer: Self-pay | Admitting: Family Medicine

## 2020-08-25 DIAGNOSIS — R058 Other specified cough: Secondary | ICD-10-CM | POA: Diagnosis not present

## 2020-08-25 MED ORDER — GUAIFENESIN-CODEINE 100-10 MG/5ML PO SYRP: 10.0000 mL | ORAL_SOLUTION | Freq: Three times a day (TID) | ORAL | 0 refills | Status: DC | PRN

## 2020-08-25 NOTE — Progress Notes (Signed)
Virtual Visit via Video   I connected with patient on 08/25/20 at  9:30 AM EST by a video enabled telemedicine application and verified that I am speaking with the correct person using two identifiers.  Location patient: Home Location provider: [facility], Office Persons participating in the virtual visit: Patient, Provider, CMA (Sabrina M)  I discussed the limitations of evaluation and management by telemedicine and the availability of in person appointments. The patient expressed understanding and agreed to proceed.  Subjective:   HPI:   URI- woke Sunday AM w/ nasal congestion, cough after being outside all day on Saturday.  Monday 'I coughed so bad'.  Had negative COVID test on Monday.  + scratchy throat, ongoing cough.  Nose is 'dripping like a faucet'.  No fevers/chills.  No HAs, body aches.  No known sick contacts.  No weakness or fatigue.  Just started Claritin yesterday.  ROS:   See pertinent positives and negatives per HPI.  Patient Active Problem List   Diagnosis Date Noted  . Pain in joint of right hip 07/15/2020  . Palpitations 09/19/2019  . Morbid obesity (HCC) 09/19/2019  . Cardiac murmur 09/19/2019  . Anxiety 05/01/2019  . Primary osteoarthritis of both knees 08/30/2018  . Vitamin D deficiency 12/14/2016  . Plantar fasciitis of left foot 01/06/2014  . Numbness and tingling of left leg 06/09/2013  . Lumbar back pain 12/23/2012  . Lateral epicondylitis 12/23/2012  . GERD (gastroesophageal reflux disease) 05/03/2012  . Seasonal allergic rhinitis 10/08/2011  . General medical examination 10/06/2011  . Hair loss 02/21/2011  . HTN (hypertension) 02/10/2011  . Hyperlipidemia 02/10/2011  . Nail fungus 02/10/2011    Social History   Tobacco Use  . Smoking status: Never Smoker  . Smokeless tobacco: Never Used  Substance Use Topics  . Alcohol use: No    Current Outpatient Medications:  .  carvedilol (COREG) 6.25 MG tablet, TAKE 1 TABLET BY MOUTH  TWICE DAILY WITH A MEAL, Disp: 180 tablet, Rfl: 0 .  levonorgestrel (MIRENA) 20 MCG/24HR IUD, 1 each by Intrauterine route once., Disp: , Rfl:  .  medroxyPROGESTERone (PROVERA) 10 MG tablet, Take 10 mg by mouth daily., Disp: , Rfl:  .  pantoprazole (PROTONIX) 40 MG tablet, TAKE 1 TABLET(40 MG) BY MOUTH TWICE DAILY, Disp: 180 tablet, Rfl: 0 .  pravastatin (PRAVACHOL) 40 MG tablet, TAKE 1 TABLET(40 MG) BY MOUTH DAILY, Disp: 90 tablet, Rfl: 1 .  triamterene-hydrochlorothiazide (MAXZIDE-25) 37.5-25 MG tablet, TAKE 1 TABLET BY MOUTH DAILY, Disp: 90 tablet, Rfl: 0 .  nystatin cream (MYCOSTATIN), Apply under breasts daily (Patient not taking: No sig reported), Disp: , Rfl:  .  predniSONE (DELTASONE) 10 MG tablet, 3 tabs x3 days and then 2 tabs x3 days and then 1 tab x3 days.  Take w/ food. (Patient not taking: No sig reported), Disp: 18 tablet, Rfl: 0 .  Vitamin D, Ergocalciferol, (DRISDOL) 1.25 MG (50000 UNIT) CAPS capsule, Take 1 capsule (50,000 Units total) by mouth every 7 (seven) days. (Patient not taking: Reported on 08/25/2020), Disp: 12 capsule, Rfl: 0  Allergies  Allergen Reactions  . Bextra [Valdecoxib]     Rash     Objective:   There were no vitals taken for this visit. AAOx3, NAD NCAT, EOMI No obvious CN deficits Coloring WNL Pt is able to speak clearly, coherently without shortness of breath or increased work of breathing.  Thought process is linear.  Mood is appropriate.   Assessment and Plan:   Allergic cough- new.  Pt was outside all day Saturday with the wind blowing and Sunday woke up w/ congestion and cough.  Hx of seasonal allergies- just started Claritin yesterday.  Pt states she is feeling well w/ exception of cough and she appears well during today's visit.  Start cough syrup for her to get some sleep at night.  Continue daily antihistamine.  Add saline nasal rinse after time outside.  Pt expressed understanding and is in agreement w/ plan.   Neena Rhymes,  MD 08/25/2020

## 2020-08-25 NOTE — Progress Notes (Signed)
I connected with  Marcia Chan on 08/25/20 by a video enabled telemedicine application and verified that I am speaking with the correct person using two identifiers.   I discussed the limitations of evaluation and management by telemedicine. The patient expressed understanding and agreed to proceed.

## 2020-09-30 ENCOUNTER — Ambulatory Visit (INDEPENDENT_AMBULATORY_CARE_PROVIDER_SITE_OTHER): Payer: BC Managed Care – PPO | Admitting: Orthopaedic Surgery

## 2020-09-30 ENCOUNTER — Encounter: Payer: Self-pay | Admitting: Orthopaedic Surgery

## 2020-09-30 ENCOUNTER — Ambulatory Visit: Payer: Self-pay

## 2020-09-30 VITALS — Ht 67.0 in | Wt 302.0 lb

## 2020-09-30 DIAGNOSIS — M1611 Unilateral primary osteoarthritis, right hip: Secondary | ICD-10-CM | POA: Diagnosis not present

## 2020-09-30 DIAGNOSIS — M25551 Pain in right hip: Secondary | ICD-10-CM | POA: Diagnosis not present

## 2020-09-30 NOTE — Progress Notes (Signed)
Office Visit Note   Patient: Marcia Chan           Date of Birth: [birth year]           MRN: [id number] Visit Date: 09/30/2020              Requested by: Sheliah Hatch, MD [address] [place],  [place] [address] PCP: Sheliah Hatch, MD   Assessment & Plan: Visit Diagnoses:  1. Pain in right hip   2. Unilateral primary osteoarthritis, right hip     Plan: The patient does have severe end-stage arthritis in her right hip.  Unfortunately, she is not a surgical candidate as of now until she is able to lose some more weight.  I did have her lay supine on the exam table to see if I was comfortable getting to her hip and thus far I am not based on her body habitus.  I do feel that weight loss could help [place] feel more comfortable with getting to her hip because her thighs not significantly big.  I explained in detail are concerned about technically being able to get to her hip and the biggest concern of skin and soft tissue issues with this type of surgery and the morbidly obese population.  She understands this fully.  I would like to see her back in 3 months for repeat weight and BMI calculation.The patient meets the AMA guidelines for Morbid (severe) obesity with a BMI > 40.0 and I have recommended weight loss.  Follow-Up Instructions: Return in about 3 months (around 12/30/2020).   Orders:  Orders Placed This Encounter  Procedures  . XR HIP UNILAT W OR W/O PELVIS 1V RIGHT   No orders of the defined types were placed in this encounter.     Procedures: No procedures performed   Clinical Data: No additional findings.   Subjective: No chief complaint on file. The patient is not a new patient.  She has been seen in our office even last year.  She is a very pleasant 50 year old female with well-documented arthritis in her right hip.  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  She has had several  intra-articular injections of steroid in her right hip.  These helped for about 3 to 4 months.  She is worked on activity modification and does take anti-inflammatories.  Today her weight is 302 pounds with a BMI calculated at 47.30.  She has gained a little bit of weight because she is on medication to treat uterine fibroids.  She will come off of that medication soon.  She does have some type of point next week as deals with weight loss.  She has seen another orthopedic surgeon in town who was uncomfortable with proceeding with hip replacement surgery given her BMI.  She is not a diabetic.  Her hip pain is daily and it is detrimentally affecting her actives daily living, her quality of life and her mobility.  HPI  Review of Systems   Objective: Vital Signs: Ht 5\' 7"  (1.702 m)   Wt (!) 302 lb (137 kg)   BMI 47.30 kg/m   Physical Exam She is alert and orient x3 and in no acute distress Ortho Exam Examination of her right hip shows significant stiffness with internal and external rotation as well as severe pain in the groin with internal and external rotation.  Her left  hip moves entirely normal with excellent rotation. Specialty Comments:  No specialty comments available.  Imaging: XR HIP UNILAT W OR W/O PELVIS 1V RIGHT  Result Date: 09/30/2020 An AP pelvis and lateral right hip show severe end-stage arthritis of the right hip.  There is almost complete loss of the joint space.  This is worsened when compared to films from July of last year.  There are sclerotic changes in the femoral head and acetabulum as well and some small para-articular osteophytes.    PMFS History: Patient Active Problem List   Diagnosis Date Noted  . Pain in joint of right hip 07/15/2020  . Palpitations 09/19/2019  . Morbid obesity (HCC) 09/19/2019  . Cardiac murmur 09/19/2019  . Anxiety 05/01/2019  . Primary osteoarthritis of both knees 08/30/2018  . Vitamin D deficiency 12/14/2016  . Plantar fasciitis of  left foot 01/06/2014  . Numbness and tingling of left leg 06/09/2013  . Lumbar back pain 12/23/2012  . Lateral epicondylitis 12/23/2012  . GERD (gastroesophageal reflux disease) 05/03/2012  . Seasonal allergic rhinitis 10/08/2011  . General medical examination 10/06/2011  . Hair loss 02/21/2011  . HTN (hypertension) 02/10/2011  . Hyperlipidemia 02/10/2011  . Nail fungus 02/10/2011   Past Medical History:  Diagnosis Date  . Anxiety 05/01/2019  . Back pain   . Bilateral swelling of feet   . Constipation   . Cough 06/21/2011  . General medical examination 10/06/2011  . GERD (gastroesophageal reflux disease)   . Hair loss 02/21/2011  . HTN (hypertension) 02/10/2011  . Hyperlipidemia   . Hypertension   . Joint pain   . Lactose intolerance   . Lateral epicondylitis 12/23/2012  . Lumbar back pain 12/23/2012  . Nail fungus 02/10/2011  . Numbness and tingling of left leg 06/09/2013  . Plantar fasciitis of left foot 01/06/2014  . Primary osteoarthritis of both knees 08/30/2018   Bilateral mild to moderate osteoarthritis and mild chondromalacia patella  . Seasonal allergic rhinitis 10/08/2011  . Severe obesity (BMI >= 40) (HCC) 02/10/2011  . Vertigo   . Vitamin D deficiency 12/14/2016    Family History  Problem Relation Age of Onset  . Arthritis Mother        OA/DJD  . Heart disease Mother   . Diabetes Mother   . Hypertension Mother   . Hyperlipidemia Mother   . Obesity Mother   . Cancer Father   . Hypertension Father   . Hyperlipidemia Father   . Sleep apnea Father   . Colon cancer Neg Hx     Past Surgical History:  Procedure Laterality Date  . DILATION AND CURETTAGE OF UTERUS  2002, 2003   Social History   Occupational History  . Occupation: Runner, broadcasting/film/video  Tobacco Use  . Smoking status: Never Smoker  . Smokeless tobacco: Never Used  Vaping Use  . Vaping Use: Never used  Substance and Sexual Activity  . Alcohol use: No  . Drug use: No  . Sexual activity: Not on file

## 2020-10-09 ENCOUNTER — Other Ambulatory Visit: Payer: Self-pay | Admitting: Family Medicine

## 2020-10-10 ENCOUNTER — Other Ambulatory Visit: Payer: Self-pay | Admitting: Medical

## 2020-10-15 ENCOUNTER — Other Ambulatory Visit: Payer: Self-pay | Admitting: Family Medicine

## 2020-11-16 ENCOUNTER — Encounter: Payer: Self-pay | Admitting: Family Medicine

## 2020-11-16 ENCOUNTER — Other Ambulatory Visit: Payer: Self-pay

## 2020-11-16 ENCOUNTER — Ambulatory Visit: Payer: BC Managed Care – PPO | Admitting: Family Medicine

## 2020-11-16 VITALS — BP 120/88 | HR 80 | Temp 97.0°F | Resp 17 | Ht 67.0 in | Wt 297.8 lb

## 2020-11-16 DIAGNOSIS — I10 Essential (primary) hypertension: Secondary | ICD-10-CM

## 2020-11-16 DIAGNOSIS — E782 Mixed hyperlipidemia: Secondary | ICD-10-CM | POA: Diagnosis not present

## 2020-11-16 DIAGNOSIS — R0683 Snoring: Secondary | ICD-10-CM

## 2020-11-16 LAB — BASIC METABOLIC PANEL
BUN: 9 mg/dL (ref 6–23)
CO2: 31 mEq/L (ref 19–32)
Calcium: 9.7 mg/dL (ref 8.4–10.5)
Chloride: 100 mEq/L (ref 96–112)
Creatinine, Ser: 0.72 mg/dL (ref 0.40–1.20)
GFR: 97.25 mL/min (ref >60.00)
Glucose, Bld: 76 mg/dL (ref 70–99)
Potassium: 4 mEq/L (ref 3.5–5.1)
Sodium: 136 mEq/L (ref 135–145)

## 2020-11-16 LAB — CBC WITH DIFFERENTIAL/PLATELET
Basophils Absolute: 0 10*3/uL (ref 0.0–0.1)
Basophils Relative: 0.4 % (ref 0.0–3.0)
Eosinophils Absolute: 0.1 10*3/uL (ref 0.0–0.7)
Eosinophils Relative: 1.4 % (ref 0.0–5.0)
HCT: 39.8 % (ref 36.0–46.0)
Hemoglobin: 13.5 g/dL (ref 12.0–15.0)
Lymphocytes Relative: 25 % (ref 12.0–46.0)
Lymphs Abs: 1.8 10*3/uL (ref 0.7–4.0)
MCHC: 33.8 g/dL (ref 30.0–36.0)
MCV: 88.7 fl (ref 78.0–100.0)
Monocytes Absolute: 0.9 10*3/uL (ref 0.1–1.0)
Monocytes Relative: 12.4 % — ABNORMAL HIGH (ref 3.0–12.0)
Neutro Abs: 4.4 10*3/uL (ref 1.4–7.7)
Neutrophils Relative %: 60.8 % (ref 43.0–77.0)
Platelets: 250 10*3/uL (ref 150.0–400.0)
RBC: 4.49 Mil/uL (ref 3.87–5.11)
RDW: 12.8 % (ref 11.5–15.5)
WBC: 7.2 10*3/uL (ref 4.0–10.5)

## 2020-11-16 LAB — HEPATIC FUNCTION PANEL
ALT: 28 U/L (ref 0–35)
AST: 23 U/L (ref 0–37)
Albumin: 4.2 g/dL (ref 3.5–5.2)
Alkaline Phosphatase: 58 U/L (ref 39–117)
Bilirubin, Direct: 0.1 mg/dL (ref 0.0–0.3)
Total Bilirubin: 0.6 mg/dL (ref 0.2–1.2)
Total Protein: 7.2 g/dL (ref 6.0–8.3)

## 2020-11-16 LAB — LIPID PANEL
Cholesterol: 158 mg/dL (ref 0–200)
HDL: 37.7 mg/dL — ABNORMAL LOW (ref >39.00)
LDL Cholesterol: 104 mg/dL — ABNORMAL HIGH (ref 0–99)
NonHDL: 120.68
Total CHOL/HDL Ratio: 4
Triglycerides: 82 mg/dL (ref 0.0–149.0)
VLDL: 16.4 mg/dL (ref 0.0–40.0)

## 2020-11-16 LAB — TSH: TSH: 1.55 u[IU]/mL (ref 0.35–4.50)

## 2020-11-16 MED ORDER — PHENTERMINE HCL 37.5 MG PO CAPS: 37.5000 mg | ORAL_CAPSULE | ORAL | 0 refills | Status: DC

## 2020-11-16 NOTE — Assessment & Plan Note (Signed)
Chronic problem, on Pravastatin 40mg  daily.  Check labs.  Adjust meds prn

## 2020-11-16 NOTE — Assessment & Plan Note (Signed)
Pt has lost 5 lbs since last visit.  Goal is to lose 20 lbs to get on the surgery schedule.  Will start Phentermine to assist in her low carb diet

## 2020-11-16 NOTE — Progress Notes (Signed)
   Subjective:    Patient ID: Marcia Chan, female    DOB: 10-Jun-1971, 50 y.o.   MRN: [id number]  HPI HTN- chronic problem, on Coreg 6.25mg  BID, Triamterene HCTZ 37.5/25mg  daily w/ good control.  No CP, SOB w/ exception of anxiety, HAs, visual changes, edema.  Hyperlipidemia- chronic problem, on Pravastatin 40mg  daily.  Denies abd pain, N/V.  Obesity- pt is down 5 lbs since last visit.  BMI is 46.64  'i'm trying'.  Pt is unable to do any physical activity due to R hip pain.  Is doing low carb and counting calories.  Needs to lose 20 lbs prior to being scheduled for hip surgery.    Possible OSA- husband reports loud snoring.  Pt states something will wake her at night and she will sorta gasp for air.   Review of Systems For ROS see HPI   This visit occurred during the SARS-CoV-2 public health emergency.  Safety protocols were in place, including screening questions prior to the visit, additional usage of staff PPE, and extensive cleaning of exam room while observing appropriate contact time as indicated for disinfecting solutions.       Objective:   Physical Exam Vitals reviewed.  Constitutional:      General: She is not in acute distress.    Appearance: Normal appearance. She is well-developed. She is obese. She is not ill-appearing.  HENT:     Head: Normocephalic and atraumatic.  Eyes:     Conjunctiva/sclera: Conjunctivae normal.     Pupils: Pupils are equal, round, and reactive to light.  Neck:     Thyroid: No thyromegaly.  Cardiovascular:     Rate and Rhythm: Normal rate and regular rhythm.     Pulses: Normal pulses.     Heart sounds: Normal heart sounds. No murmur heard.   Pulmonary:     Effort: Pulmonary effort is normal. No respiratory distress.     Breath sounds: Normal breath sounds.  Abdominal:     General: There is no distension.     Palpations: Abdomen is soft.     Tenderness: There is no abdominal tenderness.  Musculoskeletal:     Cervical back: Normal  range of motion and neck supple.     Right lower leg: No edema.     Left lower leg: No edema.  Lymphadenopathy:     Cervical: No cervical adenopathy.  Skin:    General: Skin is warm and dry.  Neurological:     General: No focal deficit present.     Mental Status: She is alert and oriented to person, place, and time.  Psychiatric:        Mood and Affect: Mood normal.        Behavior: Behavior normal.        Thought Content: Thought content normal.           Assessment & Plan:  Snoring- pt states husband indicates very loud snoring w/ 'strange breathing'.  She will wake herself and notice she needs to take a gasping breath.  Refer to pulmonary for OSA evaluation.  Pt expressed understanding and is in agreement w/ plan.

## 2020-11-16 NOTE — Patient Instructions (Signed)
Schedule your complete physical in 6 months We'll notify you of your lab results and make any changes if needed Start the Phentermine daily to help w/ weight loss Keep up the good work on low carb diet- think fruit w/ PB, cheese, etc Call with any questions or concerns Hang in there!  You've got this!!!

## 2020-11-16 NOTE — Assessment & Plan Note (Signed)
Chronic problem.  Well controlled on Coreg 6.25mg  BID and Triamterene HCTZ.  Asymptomatic.  Check labs.  No anticipated med changes.

## 2020-11-19 ENCOUNTER — Telehealth: Payer: Self-pay

## 2020-11-19 NOTE — Telephone Encounter (Signed)
Called and spoke with patient about a PA on Phentermine 37.5 and informed her that insurance denied it. Patient stated that she paid out of pocket using good RX.

## 2020-12-01 ENCOUNTER — Ambulatory Visit: Payer: BC Managed Care – PPO | Admitting: Orthopaedic Surgery

## 2020-12-09 ENCOUNTER — Ambulatory Visit: Payer: BC Managed Care – PPO | Admitting: Orthopaedic Surgery

## 2020-12-09 ENCOUNTER — Encounter: Payer: Self-pay | Admitting: Orthopaedic Surgery

## 2020-12-09 VITALS — Ht 68.0 in | Wt 282.6 lb

## 2020-12-09 DIAGNOSIS — M1611 Unilateral primary osteoarthritis, right hip: Secondary | ICD-10-CM

## 2020-12-09 MED ORDER — ACETAMINOPHEN-CODEINE #3 300-30 MG PO TABS: 1.0000 | ORAL_TABLET | Freq: Three times a day (TID) | ORAL | 0 refills | Status: DC | PRN

## 2020-12-09 NOTE — Progress Notes (Signed)
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  She ambulates with a cane.  She does need hip replacement surgery desperately with the right hip given her x-ray findings and clinical exam findings.  However, her main limitation has been her morbid obesity.  At her last visit her BMI was over 47.  She is now lost 20 pounds.  Today's BMI is down to 42.97.  Her right hip pain is debilitating.  It is detriment affecting her activities day living, quality of life and her mobility.  It hurts on my exam just rotating her hip around.  She is doing great with her weight loss journey thus far.  I would like to see her back in only just 2 weeks.  If she is even lost 1 or 2 more pounds, we will go ahead and work on setting her up for surgery because I do not think there is an insurance limitation for [place] proceeding with the surgery.  I would feel comfortable with her weight being just slightly lower but based on her soft tissue planes and her exam and how she has done thus far with losing weight, I think she is becoming a great candidate for the surgery.  Again we will see her back in just 2 weeks for repeat weight and BMI calculation.

## 2020-12-27 ENCOUNTER — Encounter: Payer: Self-pay | Admitting: Orthopaedic Surgery

## 2020-12-27 ENCOUNTER — Ambulatory Visit (INDEPENDENT_AMBULATORY_CARE_PROVIDER_SITE_OTHER): Payer: BC Managed Care – PPO | Admitting: Orthopaedic Surgery

## 2020-12-27 VITALS — Ht 67.25 in | Wt 269.0 lb

## 2020-12-27 DIAGNOSIS — M1611 Unilateral primary osteoarthritis, right hip: Secondary | ICD-10-CM | POA: Diagnosis not present

## 2020-12-27 NOTE — Progress Notes (Signed)
The patient is well-known to me.  She has debilitating arthritis of her right hip and has been well-documented with been treating for well over a year now.  She also had a BMI of almost 48 and now she is having a BMI down to 41.  Her hip pain is 10 out of 10 and is daily.  She ambulates with a cane.  She has done everything we have asked for her to get her ready for hip replacement surgery.  Her x-ray shows severe and debilitating end-stage arthritis of the right hip.  At this point her right hip pain is definitely affecting her mobility, her quality of life and actives daily living.  She is only 50 years old.  Her right hip has severe pain on any attempts of internal and external rotation.  Her left hip moves smoothly and normally.  She is had no other acute change in medical status.  I am comfortable at this point with proceeding with hip replacement surgery for her right hip given that she is continuing on her weight loss journey and I do feel that with hip replacement surgery at this point she will continue to lose weight and she is already lost a significant amount.  We had a long thorough discussion about the risks and benefits of the surgery.  We talked about the goals of surgery and what to expect with an intraoperative and postoperative course.  All question concerns were answered addressed.  We will work on getting this scheduled at this point.

## 2020-12-29 ENCOUNTER — Encounter: Payer: Self-pay | Admitting: Orthopaedic Surgery

## 2020-12-29 ENCOUNTER — Ambulatory Visit: Payer: BC Managed Care – PPO | Admitting: Orthopaedic Surgery

## 2020-12-29 ENCOUNTER — Encounter: Payer: Self-pay | Admitting: *Deleted

## 2021-01-06 ENCOUNTER — Other Ambulatory Visit: Payer: Self-pay

## 2021-01-07 ENCOUNTER — Encounter: Payer: Self-pay | Admitting: Orthopaedic Surgery

## 2021-01-07 NOTE — Telephone Encounter (Signed)
Pt calling again stating she is in severe pain and having difficulties doing every day tasks and requests that her surgery date be moved up sooner please. The best call back number is [phone].

## 2021-01-10 ENCOUNTER — Telehealth: Payer: Self-pay | Admitting: Orthopaedic Surgery

## 2021-01-10 NOTE — Telephone Encounter (Signed)
Received call from patient, she is requesting xrays on CD. Please call when ready [phone]

## 2021-01-10 NOTE — Telephone Encounter (Signed)
Patient aware.

## 2021-01-10 NOTE — Telephone Encounter (Signed)
Patient is scheduled for THA 8/19, she is in severe pain and can't wait. Can you recommend a colleague to do her surgery? [phone]

## 2021-01-11 NOTE — Telephone Encounter (Signed)
IC, advised patient CD is ready.

## 2021-01-11 NOTE — Telephone Encounter (Signed)
CD placed at front desk for pickup.  

## 2021-01-11 NOTE — Telephone Encounter (Signed)
Will you do this since you have everything at your desk?

## 2021-01-12 ENCOUNTER — Other Ambulatory Visit: Payer: Self-pay | Admitting: Family Medicine

## 2021-01-17 ENCOUNTER — Other Ambulatory Visit: Payer: Self-pay

## 2021-01-17 ENCOUNTER — Encounter: Payer: Self-pay | Admitting: Family Medicine

## 2021-01-17 ENCOUNTER — Ambulatory Visit: Payer: BC Managed Care – PPO | Admitting: Family Medicine

## 2021-01-17 ENCOUNTER — Institutional Professional Consult (permissible substitution): Payer: BC Managed Care – PPO | Admitting: Pulmonary Disease

## 2021-01-17 NOTE — Patient Instructions (Signed)
Follow up as needed or as scheduled Continue to eat unlimited fruits/veggies Add natural carbs- rice, potatoes, whole wheat bread Make sure you are grazing through the day- add yogurt to your fruit, peanut butter on apples, string cheese or baby bell cheese- throughout the day You have done AMAZING!!!!  But now we need to eat just a bit more! Call with any questions or concerns GOOD LUCK!!!

## 2021-01-17 NOTE — Progress Notes (Signed)
   Subjective:    Patient ID: Marcia Chan, female    DOB: 07-11-1970, 50 y.o.   MRN: [id number]  HPI Obesity- pt is down 30 lbs since 5/17.  Pt was eating no carb diet Sun-Thursday and then eating some carbs on Fri/Sat.  Drinking exclusively water or diet green tea.  Has cut out processed foods.  Pt reports she has been 'stuck' for ~2 weeks.  Pt was having a hard time figuring out what to eat- was having nausea.  Particularly having nausea w/ eggs.  Has R hip replacement scheduled on 8/1.   Review of Systems For ROS see HPI   This visit occurred during the SARS-CoV-2 public health emergency.  Safety protocols were in place, including screening questions prior to the visit, additional usage of staff PPE, and extensive cleaning of exam room while observing appropriate contact time as indicated for disinfecting solutions.      Objective:   Physical Exam Vitals reviewed.  Constitutional:      General: She is not in acute distress.    Appearance: Normal appearance. She is not ill-appearing.  HENT:     Head: Normocephalic and atraumatic.  Cardiovascular:     Rate and Rhythm: Normal rate and regular rhythm.  Pulmonary:     Effort: Pulmonary effort is normal.     Breath sounds: Normal breath sounds.  Skin:    General: Skin is warm and dry.  Neurological:     General: No focal deficit present.     Mental Status: She is alert and oriented to person, place, and time.  Psychiatric:        Mood and Affect: Mood normal.        Behavior: Behavior normal.        Thought Content: Thought content normal.          Assessment & Plan:

## 2021-02-18 ENCOUNTER — Ambulatory Visit (HOSPITAL_COMMUNITY)
Admission: RE | Admit: 2021-02-18 | Payer: BC Managed Care – PPO | Source: Home / Self Care | Admitting: Orthopaedic Surgery

## 2021-02-18 ENCOUNTER — Encounter (HOSPITAL_COMMUNITY): Admission: RE | Payer: Self-pay | Source: Home / Self Care

## 2021-02-18 SURGERY — ARTHROPLASTY, HIP, TOTAL, ANTERIOR APPROACH
Anesthesia: Spinal | Site: Hip | Laterality: Right

## 2021-03-03 ENCOUNTER — Encounter: Payer: BC Managed Care – PPO | Admitting: Orthopaedic Surgery

## 2021-03-10 NOTE — Assessment & Plan Note (Signed)
Pt is down 30 lbs since mid-May.  She has done this by eating no carb diet most of the week and cutting out processed foods.  Reports she has been sluggish, nauseated, and frustrated as her weight has plateaued.  Reviewed that GERD is worse if there are long stretches between eating and that the acid can cause nausea.  Discussed the need to eat more regularly throughout the day- grazing.  Also encouraged her to add back natural carbs b/c I suspect she is not eating enough calories to sustain her.  If she's not eating enough calories, her body will go into starvation mode and store calories as fat for later use.  This is likely why she has hit a plateau.  Pt expressed understanding and we reviewed healthy snack options and ways to incorporate this into her daily life.  Total time spent w/ patient ~30 minutes and >50% spent counseling.

## 2021-04-01 ENCOUNTER — Ambulatory Visit: Payer: BC Managed Care – PPO

## 2021-04-11 ENCOUNTER — Encounter: Payer: Self-pay | Admitting: Family Medicine

## 2021-04-11 ENCOUNTER — Ambulatory Visit (INDEPENDENT_AMBULATORY_CARE_PROVIDER_SITE_OTHER): Payer: BC Managed Care – PPO

## 2021-04-11 ENCOUNTER — Other Ambulatory Visit: Payer: Self-pay

## 2021-04-11 DIAGNOSIS — Z23 Encounter for immunization: Secondary | ICD-10-CM

## 2021-04-13 ENCOUNTER — Other Ambulatory Visit: Payer: Self-pay | Admitting: Family Medicine

## 2021-04-21 ENCOUNTER — Telehealth (INDEPENDENT_AMBULATORY_CARE_PROVIDER_SITE_OTHER): Payer: BC Managed Care – PPO | Admitting: Family Medicine

## 2021-04-21 ENCOUNTER — Encounter: Payer: Self-pay | Admitting: Family Medicine

## 2021-04-21 VITALS — Temp 98.2°F

## 2021-04-21 DIAGNOSIS — J040 Acute laryngitis: Secondary | ICD-10-CM | POA: Diagnosis not present

## 2021-04-21 MED ORDER — BENZONATATE 100 MG PO CAPS: 100.0000 mg | ORAL_CAPSULE | Freq: Three times a day (TID) | ORAL | 0 refills | Status: DC | PRN

## 2021-04-21 NOTE — Progress Notes (Signed)
Virtual Visit via Video Note  I connected with Landrie  on 04/21/21 at 10:20 AM EDT by a video enabled telemedicine application and verified that I am speaking with the correct person using two identifiers.  Location patient: home, [place] Location provider:work or home office Persons participating in the virtual visit: patient, provider  I discussed the limitations of evaluation and management by telemedicine and the availability of in person appointments. The patient expressed understanding and agreed to proceed.   HPI:  Acute telemedicine visit for sinus issues, sore throat: -Onset: 3 days ago -did a covid test yesterday which was negative (home test) -Symptoms include: laryngitis, pnd, sore throat has resolved, cough -Denies: fever, CP, SOB, NVD, body aches, inability to eat/drink/get out of bed -Pertinent past medical history:see below -Pertinent medication allergies:  Allergies  Allergen Reactions   Bextra [Valdecoxib]     Rash   -COVID-19 vaccine status: vaccinated x2 and 1 booster  ROS: See pertinent positives and negatives per HPI.  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   Seasonal allergic rhinitis 10/08/2011   Severe obesity (BMI >= 40) (HCC) 02/10/2011   Vertigo    Vitamin D deficiency 12/14/2016    Past Surgical History:  Procedure Laterality Date   DILATION  AND CURETTAGE OF UTERUS  2002, 2003     Current Outpatient Medications:    carvedilol (COREG) 6.25 MG tablet, TAKE 1 TABLET BY MOUTH TWICE DAILY WITH A MEAL, Disp: 180 tablet, Rfl: 0   levonorgestrel (MIRENA) 20 MCG/24HR IUD, 1 each by Intrauterine route once., Disp: , Rfl:    pantoprazole (PROTONIX) 40 MG tablet, TAKE 1 TABLET(40 MG) BY MOUTH TWICE DAILY, Disp: 180 tablet, Rfl: 0   phentermine 37.5 MG capsule, Take 1 capsule (37.5 mg total) by mouth every morning., Disp: 90 capsule, Rfl: 0   pravastatin (PRAVACHOL) 40 MG tablet, TAKE 1 TABLET(40 MG) BY MOUTH DAILY, Disp: 90 tablet, Rfl: 1   triamterene-hydrochlorothiazide (MAXZIDE-25) 37.5-25 MG tablet, TAKE 1 TABLET BY MOUTH DAILY, Disp: 90 tablet, Rfl: 0   benzonatate (TESSALON PERLES) 100 MG capsule, Take 1 capsule (100 mg total) by mouth 3 (three) times daily as needed., Disp: 20 capsule, Rfl: 0  EXAM:  VITALS per patient if applicable:  GENERAL: alert, oriented, appears well and in no acute distress  HEENT: atraumatic, conjunttiva clear, no obvious abnormalities on inspection of external nose and ears  NECK: normal movements of the head and neck  LUNGS: on inspection no signs of respiratory distress, breathing rate appears normal, no obvious gross SOB, gasping or wheezing  CV: no obvious cyanosis  MS: moves all visible extremities without noticeable abnormality  PSYCH/NEURO: pleasant and cooperative, no obvious depression or anxiety, speech and thought processing grossly intact  ASSESSMENT AND PLAN:  Discussed the following assessment and plan:  Laryngitis  -we discussed  possible serious and likely etiologies, options for evaluation and workup, limitations of telemedicine visit vs in person visit, treatment, treatment risks and precautions. Pt is agreeable to treatment via telemedicine at this moment.  Discussed various potential etiologies, most likely viral.  Discussed possibility of COVID with false negative early  testing.  She agrees to a second home COVID test.  If positive, discussed options for treatment via [facility] pharmacy or a follow-up virtual visit.  In the interim sent Tessalon for cough, advised warm liquids, being gentle on the voice, nasal saline and possibly short course of nasal decongestant. Work/School slipped offered: provided in patient instructions    Advised to seek prompt in person care if worsening, new symptoms arise, or if is not improving with treatment. Discussed options for inperson care if PCP office not available. Did let this patient know that I only do telemedicine on Tuesdays and Thursdays for [facility]. Advised to schedule follow up visit with PCP or [facility] if any further questions or concerns to avoid delays in care.   I discussed the assessment and treatment plan with the patient. The patient was provided an opportunity to ask questions and all were answered. The patient agreed with the plan and demonstrated an understanding of the instructions.     Terressa Koyanagi, DO

## 2021-04-21 NOTE — Patient Instructions (Addendum)
   ---------------------------------------------------------------------------------------------------------------------------      WORK SLIP:  Patient Marcia Chan,  1970/11/08, was seen for a medical visit today, 04/21/21 . Please excuse from work for a COVID like illness. If covid testing is positive, we advise 10 days minimum from the onset of symptoms (04/18/21) PLUS 1 day of no fever and improved symptoms. Will defer to employer for a sooner return to work if covid test is negative or if symptoms have resolved, it is greater than 5 days since the positive test and the patient can wear a high-quality, tight fitting mask such as N95 or KN95 at all times for an additional 5 days. Would also suggest COVID19 antigen testing is negative prior to return.  Sincerely: E-signature: Dr. Kriste Basque, DO [facility] [place] Ph: [phone]   ------------------------------------------------------------------------------------------------------------------------------    HOME CARE TIPS:  -COVID19 testing information: GoldAgenda.[email or web address]  Most pharmacies also offer testing and home test kits. If the Covid19 test is positive and you desire antiviral treatment, please contact a [facility] pharmacy or schedule a follow up virtual visit through your primary care office or through the [employer].  Other test to treat options: http://www.[email or web address]-[email or web address].[email or web address]_source=alert  -I sent the medication(s) we discussed to your pharmacy: Meds ordered this encounter  Medications   DISCONTD: benzonatate (TESSALON PERLES) 100 MG capsule    Sig: Take 1 capsule (100 mg total) by mouth 3 (three) times daily as needed.    Dispense:  20 capsule    Refill:  0   benzonatate (TESSALON PERLES) 100 MG capsule    Sig: Take 1 capsule (100 mg total) by mouth 3 (three) times daily as needed.    Dispense:  20 capsule    Refill:  0     -can use tylenol or  aleve if needed for fevers, aches and pains per instructions  -can use nasal saline a few times per day if you have nasal congestion; sometimes  a short course of Afrin nasal spray for 3 days can help with symptoms as well  -stay hydrated, drink plenty of fluids and eat small healthy meals - avoid dairy  -can take 1000 IU ( ) Vit D3 and 100-500 mg of Vit C daily per instructions  -If the Covid test is positive, check out the [facility] website for more information on home care, transmission and treatment for COVID19  -follow up with your doctor in 2-3 days unless improving and feeling better  -stay home while sick, except to seek medical care. If you have COVID19, ideally it would be best to stay home for a full 10 days since the onset of symptoms PLUS one day of no fever and feeling better. Wear a good mask that fits snugly (such as N95 or KN95) if around others to reduce the risk of transmission.  It was nice to meet you today, and I really hope you are feeling better soon. I help [facility] out with telemedicine visits on Tuesdays and Thursdays and am available for visits on those days. If you have any concerns or questions following this visit please schedule a follow up visit with your Primary Care doctor or seek care at a local urgent care clinic to avoid delays in care.    Seek in person care or schedule a follow up video visit promptly if your symptoms worsen, new concerns arise or you are not improving with treatment. Call 911 and/or seek emergency care if your symptoms are severe or life threatening.

## 2021-05-20 ENCOUNTER — Ambulatory Visit (INDEPENDENT_AMBULATORY_CARE_PROVIDER_SITE_OTHER): Payer: BC Managed Care – PPO | Admitting: Family Medicine

## 2021-05-20 ENCOUNTER — Encounter: Payer: Self-pay | Admitting: Family Medicine

## 2021-05-20 VITALS — BP 120/80 | HR 77 | Temp 97.6°F | Resp 17 | Ht 67.25 in | Wt 283.2 lb

## 2021-05-20 DIAGNOSIS — Z0000 Encounter for general adult medical examination without abnormal findings: Secondary | ICD-10-CM | POA: Diagnosis not present

## 2021-05-20 DIAGNOSIS — N926 Irregular menstruation, unspecified: Secondary | ICD-10-CM

## 2021-05-20 DIAGNOSIS — E559 Vitamin D deficiency, unspecified: Secondary | ICD-10-CM | POA: Diagnosis not present

## 2021-05-20 MED ORDER — METHOCARBAMOL 500 MG PO TABS: 500.0000 mg | ORAL_TABLET | Freq: Three times a day (TID) | ORAL | 1 refills | Status: DC | PRN

## 2021-05-20 NOTE — Progress Notes (Signed)
   Subjective:    Patient ID: Marcia Chan, female    DOB: [birth year], 50 y.o.   MRN: [id number]  HPI CPE- UTD on pap, colonoscopy, mammo.  UTD on Tdap, flu.  Patient Care Team    Relationship Specialty Notifications Start End  Sheliah Hatch, MD PCP - General Family Medicine  02/10/11   Lynnea Ferrier., MD Referring Physician Obstetrics and Gynecology  06/03/15     Health Maintenance  Topic Date Due   Pneumococcal Vaccine 65-98 Years old (1 - PCV) Never done   Zoster Vaccines- Shingrix (1 of 2) Never done   COVID-19 Vaccine (3 - Pfizer risk series) 10/25/2019   Hepatitis C Screening  01/17/2022 (Originally [birth year]/1988)   HIV Screening  01/17/2022 (Originally 02/04/1985)   MAMMOGRAM  08/24/2021   PAP SMEAR-Modifier  07/30/2022   COLONOSCOPY (Pts 45-25yrs Insurance coverage will need to be confirmed)  01/10/2024   TETANUS/TDAP  04/17/2024   INFLUENZA VACCINE  Completed   HPV VACCINES  Aged Out      Review of Systems Patient reports no vision/ hearing changes, adenopathy,fever, persistant/recurrent hoarseness , swallowing issues, chest pain, palpitations, edema, hemoptysis, dyspnea (rest/exertional/paroxysmal nocturnal), gastrointestinal bleeding (melena, rectal bleeding), abdominal pain, significant heartburn, bowel changes, GU symptoms (dysuria, hematuria, incontinence), Gyn symptoms (abnormal  bleeding, pain),  syncope, focal weakness, memory loss, numbness & tingling, skin/hair/nail changes, abnormal bruising or bleeding, anxiety, or depression.   + 16 lb weight gain + cough for ~1 month  This visit occurred during the SARS-CoV-2 public health emergency.  Safety protocols were in place, including screening questions prior to the visit, additional usage of staff PPE, and extensive cleaning of exam room while observing appropriate contact time as indicated for disinfecting solutions.      Objective:   Physical Exam General Appearance:    Alert, cooperative, no distress,  appears stated age, obese  Head:    Normocephalic, without obvious abnormality, atraumatic  Eyes:    PERRL, conjunctiva/corneas clear, EOM's intact, fundi    benign, both eyes  Ears:    Normal TM's and external ear canals, both ears  Nose:  Deferred due to COVID  Throat:   Neck:   Supple, symmetrical, trachea midline, no adenopathy;    Thyroid: no enlargement/tenderness/nodules  Back:     Symmetric, no curvature, ROM normal, no CVA tenderness  Lungs:     Clear to auscultation bilaterally, respirations unlabored  Chest Wall:    No tenderness or deformity   Heart:    Regular rate and rhythm, S1 and S2 normal, no murmur, rub   or gallop  Breast Exam:    Deferred to GYN  Abdomen:     Soft, non-tender, bowel sounds active all four quadrants,    no masses, no organomegaly  Genitalia:    Deferred to GYN  Rectal:    Extremities:   Extremities normal, atraumatic, no cyanosis or edema  Pulses:   2+ and symmetric all extremities  Skin:   Skin color, texture, turgor normal, no rashes or lesions  Lymph nodes:   Cervical, supraclavicular, and axillary nodes normal  Neurologic:   CNII-XII intact, normal strength, sensation and reflexes    throughout          Assessment & Plan:

## 2021-05-20 NOTE — Patient Instructions (Signed)
Follow up after the new year to discuss weight loss and Ozempic/Wegovy We'll notify you of your lab results and make any changes if needed Continue to work on healthy diet and regular exercise- you can do it! ADD daily allergy medication to help w/ cough Call with any questions or concerns Stay Safe! Stay Healthy! Happy Holidays!

## 2021-05-20 NOTE — Assessment & Plan Note (Signed)
Pt's PE WNL w/ exception of obesity.  UTD on pap, mammo, colonoscopy, immunizations.  Check labs.  Anticipatory guidance provided.  

## 2021-05-20 NOTE — Assessment & Plan Note (Signed)
Deteriorated.  Pt has gained back 16 lbs of what she had lost for surgery.  Encouraged healthy diet and regular exercise.  Check labs to risk stratify.  Will follow.

## 2021-05-20 NOTE — Assessment & Plan Note (Signed)
Check labs and replete prn. 

## 2021-05-21 LAB — LIPID PANEL
Cholesterol: 150 mg/dL (ref <200)
HDL: 36 mg/dL — ABNORMAL LOW (ref >=50)
LDL Cholesterol (Calc): 97 mg/dL (calc)
Non-HDL Cholesterol (Calc): 114 mg/dL (calc) (ref <130)
Total CHOL/HDL Ratio: 4.2 (calc) (ref <5.0)
Triglycerides: 84 mg/dL (ref <150)

## 2021-05-21 LAB — CBC WITH DIFFERENTIAL/PLATELET
Absolute Monocytes: 667 cells/uL (ref 200–950)
Basophils Absolute: 29 cells/uL (ref 0–200)
Basophils Relative: 0.5 %
Eosinophils Absolute: 97 cells/uL (ref 15–500)
Eosinophils Relative: 1.7 %
HCT: 37.3 % (ref 35.0–45.0)
Hemoglobin: 12.4 g/dL (ref 11.7–15.5)
Lymphs Abs: 1659 cells/uL (ref 850–3900)
MCH: 29 pg (ref 27.0–33.0)
MCHC: 33.2 g/dL (ref 32.0–36.0)
MCV: 87.4 fL (ref 80.0–100.0)
MPV: 10.8 fL (ref 7.5–12.5)
Monocytes Relative: 11.7 %
Neutro Abs: 3249 cells/uL (ref 1500–7800)
Neutrophils Relative %: 57 %
Platelets: 268 10*3/uL (ref 140–400)
RBC: 4.27 10*6/uL (ref 3.80–5.10)
RDW: 12.4 % (ref 11.0–15.0)
Total Lymphocyte: 29.1 %
WBC: 5.7 10*3/uL (ref 3.8–10.8)

## 2021-05-21 LAB — HEPATIC FUNCTION PANEL
AG Ratio: 1.3 (calc) (ref 1.0–2.5)
ALT: 9 U/L (ref 6–29)
AST: 13 U/L (ref 10–35)
Albumin: 3.8 g/dL (ref 3.6–5.1)
Alkaline phosphatase (APISO): 57 U/L (ref 37–153)
Bilirubin, Direct: 0.1 mg/dL (ref 0.0–0.2)
Globulin: 3 g/dL (calc) (ref 1.9–3.7)
Indirect Bilirubin: 0.4 mg/dL (calc) (ref 0.2–1.2)
Total Bilirubin: 0.5 mg/dL (ref 0.2–1.2)
Total Protein: 6.8 g/dL (ref 6.1–8.1)

## 2021-05-21 LAB — BASIC METABOLIC PANEL
BUN: 10 mg/dL (ref 7–25)
CO2: 28 mmol/L (ref 20–32)
Calcium: 9.1 mg/dL (ref 8.6–10.4)
Chloride: 101 mmol/L (ref 98–110)
Creat: 0.62 mg/dL (ref 0.50–1.03)
Glucose, Bld: 76 mg/dL (ref 65–99)
Potassium: 3.7 mmol/L (ref 3.5–5.3)
Sodium: 137 mmol/L (ref 135–146)

## 2021-05-21 LAB — ESTRADIOL: Estradiol: 36 pg/mL

## 2021-05-21 LAB — VITAMIN D 25 HYDROXY (VIT D DEFICIENCY, FRACTURES): Vit D, 25-Hydroxy: 37 ng/mL (ref 30–100)

## 2021-05-21 LAB — PROLACTIN: Prolactin: 8.9 ng/mL

## 2021-05-21 LAB — TSH: TSH: 1.33 mIU/L

## 2021-05-21 LAB — FOLLICLE STIMULATING HORMONE: FSH: 6.2 m[IU]/mL

## 2021-05-21 LAB — LUTEINIZING HORMONE: LH: 1.8 m[IU]/mL

## 2021-06-02 ENCOUNTER — Telehealth: Payer: Self-pay

## 2021-06-02 NOTE — Telephone Encounter (Signed)
-----   Message from Sheliah Hatch, MD sent at 05/30/2021  7:41 AM EST ----- Labs look good!  Hormone levels indicate that you're not yet menopausal but you can definitely be in that peri-menopausal range where hormones are fluctuating.

## 2021-06-02 NOTE — Telephone Encounter (Signed)
Patient is aware of labs °

## 2021-06-03 ENCOUNTER — Telehealth: Payer: Self-pay | Admitting: Family Medicine

## 2021-06-03 NOTE — Telephone Encounter (Signed)
Spoke to patient and she stated that the pharmacy made a mistake and they corrected it

## 2021-06-03 NOTE — Telephone Encounter (Signed)
..  Caller name:  Jacinda Kanady  On DPR? :yes/no: Yes  Call back [phone]  Provider they see: Beverely Low   Reason for call:   Patient received miloxicam instead of methylcarbonate - can the script be sent in again?

## 2021-06-07 ENCOUNTER — Encounter: Payer: Self-pay | Admitting: Family Medicine

## 2021-06-07 ENCOUNTER — Telehealth (INDEPENDENT_AMBULATORY_CARE_PROVIDER_SITE_OTHER): Payer: BC Managed Care – PPO | Admitting: Family Medicine

## 2021-06-07 VITALS — Temp 98.8°F | Wt 282.0 lb

## 2021-06-07 DIAGNOSIS — J111 Influenza due to unidentified influenza virus with other respiratory manifestations: Secondary | ICD-10-CM

## 2021-06-07 MED ORDER — BENZONATATE 100 MG PO CAPS: ORAL_CAPSULE | ORAL | 0 refills | Status: DC

## 2021-06-07 NOTE — Patient Instructions (Signed)
  HOME CARE TIPS:   -I sent the medication(s) we discussed to your pharmacy: Meds ordered this encounter  Medications   benzonatate (TESSALON PERLES) 100 MG capsule    Sig: 1-2 capsules up to twice daily as needed for cough    Dispense:  30 capsule    Refill:  0    -can use tylenol or aleve if needed for fevers, aches and pains per instructions  -can use nasal saline a few times per day if you have nasal congestion  -a short course of Afrin nasal spray for 3 days can help with symptoms as well  -stay hydrated, drink plenty of fluids and eat small healthy meals - avoid dairy  -can take 1000 IU ( ) Vit D3 and 100-500 mg of Vit C daily per instructions  -follow up with your doctor in 2-3 days unless improving and feeling better  -stay home while sick. With the flu most people are contagious for 5-7 days and until feeling better.   It was nice to meet you today, and I really hope you are feeling better soon. I help [facility] out with telemedicine visits on Tuesdays and Thursdays and am available for visits on those days. If you have any concerns or questions following this visit please schedule a follow up visit with your Primary Care doctor or seek care at a local urgent care clinic to avoid delays in care.    Seek in person care or schedule a follow up video visit promptly if your symptoms worsen, new concerns arise or you are not improving with treatment. Call 911 and/or seek emergency care if your symptoms are severe or life threatening.

## 2021-06-07 NOTE — Progress Notes (Signed)
Virtual Visit via Video Note  I connected with Marcia Chan  on 06/07/21 at 11:00 AM EST by a video enabled telemedicine application and verified that I am speaking with the correct person using two identifiers.  Location patient: home, [place] Location provider:work or home office Persons participating in the virtual visit: patient, provider  I discussed the limitations of evaluation and management by telemedicine and the availability of in person appointments. The patient expressed understanding and agreed to proceed.   HPI:  Acute telemedicine visit for cough and congestion and flu like symptoms: -Onset:  about 5-6 days ago; she went to urgent care and tested negative for covid and flu when first got sick, but feels certain is the flu -Symptoms include: cough, fatigue, body aches and headache initially, nasal congestion, diarrhea -son sick as well with the same symptoms and tested positive for flu, her coworker had flu too and worked around her sick until found out -persistent nasal congestion and cough -Denies: CP, SOB, NVD, inability to eat/drink -Has tried:musinex -Pertinent past medical history: see below -Pertinent medication allergies:  Allergies  Allergen Reactions   Bextra [Valdecoxib]     Rash   -COVID-19 vaccine status:  Immunization History  Administered Date(s) Administered   Influenza Split 05/03/2012   Influenza,inj,Quad PF,6+ Mos 05/06/2013, 04/17/2014, 05/07/2015, 04/17/2016, 04/12/2017, 04/10/2018, 05/01/2019, 05/07/2020, 04/11/2021   PFIZER(Purple Top)SARS-COV-2 Vaccination 09/06/2019, 09/27/2019   Pfizer Covid-19 Vaccine Bivalent Booster 56yrs & up 05/28/2020   Tdap 04/17/2014    ROS: See pertinent positives and negatives per HPI.  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   Seasonal allergic rhinitis 10/08/2011   Severe obesity (BMI >= 40) (HCC) 02/10/2011   Vertigo    Vitamin D deficiency 12/14/2016    Past Surgical History:  Procedure Laterality Date   DILATION AND CURETTAGE OF UTERUS  2002, 2003     Current Outpatient Medications:    benzonatate (TESSALON PERLES) 100 MG capsule, 1-2 capsules up to twice daily as needed for cough, Disp: 30 capsule, Rfl: 0   carvedilol (COREG) 6.25 MG tablet, TAKE 1 TABLET BY MOUTH TWICE DAILY WITH A MEAL, Disp: 180 tablet, Rfl: 0   levonorgestrel (MIRENA) 20 MCG/24HR IUD, 1 each by Intrauterine route once., Disp: , Rfl:    methocarbamol (ROBAXIN) 500 MG tablet, Take 1 tablet (500 mg total) by mouth every 8 (eight) hours as needed for muscle spasms., Disp: 60 tablet, Rfl: 1   pantoprazole (PROTONIX) 40 MG tablet, TAKE 1 TABLET(40 MG) BY MOUTH TWICE DAILY (Patient taking differently: daily.), Disp: 180 tablet, Rfl: 0   pravastatin (PRAVACHOL) 40 MG tablet, TAKE 1 TABLET(40 MG) BY MOUTH DAILY, Disp: 90 tablet, Rfl: 1   triamterene-hydrochlorothiazide (MAXZIDE-25) 37.5-25 MG tablet, TAKE 1 TABLET BY MOUTH DAILY, Disp: 90 tablet, Rfl: 0  EXAM:  VITALS per patient if applicable:  GENERAL: alert, oriented, appears well and in no acute distress  HEENT: atraumatic, conjunttiva clear, no obvious abnormalities on inspection of external nose and ears  NECK: normal movements of the head and neck  LUNGS: on inspection no signs of respiratory distress, breathing rate appears normal, no obvious gross SOB, gasping or  wheezing  CV: no obvious cyanosis  MS: moves all visible extremities without noticeable abnormality  PSYCH/NEURO: pleasant and cooperative, no obvious depression or anxiety, speech and thought processing grossly intact  ASSESSMENT AND PLAN:  Discussed the following assessment and plan:  Influenza  -we discussed possible serious and likely etiologies, options for evaluation and workup, limitations of telemedicine visit vs in person visit, treatment, treatment risks and precautions. Pt is agreeable to treatment via telemedicine at this moment. Likely influenza vs other. She is out of the treatment window for tamiflu. Opted for Tessalon Rx for cough and symptomatic care measures per patient instructions.  Work/School slipped offered: declined Advised to seek prompt vv follow up or in person care if worsening, new symptoms arise, or if is not improving with treatment. Discussed options for care if PCP office not available. Did let this patient know that I only do telemedicine on Tuesdays and Thursdays for [place]. Advised to schedule follow up visit with PCP or [facility] if any further questions or concerns to avoid delays in care.I discussed the assessment and treatment plan with the patient. The patient was provided an opportunity to ask questions and all were answered. The patient agreed with the plan and demonstrated an understanding of the instructions.     Terressa Koyanagi, DO

## 2021-06-24 ENCOUNTER — Encounter: Payer: Self-pay | Admitting: Family Medicine

## 2021-06-24 ENCOUNTER — Ambulatory Visit: Payer: BC Managed Care – PPO | Admitting: Family Medicine

## 2021-06-24 ENCOUNTER — Ambulatory Visit (INDEPENDENT_AMBULATORY_CARE_PROVIDER_SITE_OTHER)
Admission: RE | Admit: 2021-06-24 | Discharge: 2021-06-24 | Disposition: A | Discharge status: Home / Self Care | Payer: BC Managed Care – PPO | Source: Ambulatory Visit | Attending: Family Medicine | Admitting: Family Medicine

## 2021-06-24 VITALS — BP 118/70 | HR 89 | Temp 98.5°F | Resp 16 | Wt 282.4 lb

## 2021-06-24 DIAGNOSIS — R058 Other specified cough: Secondary | ICD-10-CM | POA: Diagnosis not present

## 2021-06-24 DIAGNOSIS — J329 Chronic sinusitis, unspecified: Secondary | ICD-10-CM | POA: Diagnosis not present

## 2021-06-24 DIAGNOSIS — B9689 Other specified bacterial agents as the cause of diseases classified elsewhere: Secondary | ICD-10-CM

## 2021-06-24 MED ORDER — DOXYCYCLINE HYCLATE 100 MG PO TABS: 100.0000 mg | ORAL_TABLET | Freq: Two times a day (BID) | ORAL | 0 refills | Status: DC

## 2021-06-24 MED ORDER — ALBUTEROL SULFATE HFA 108 (90 BASE) MCG/ACT IN AERS: 2.0000 | INHALATION_SPRAY | Freq: Four times a day (QID) | RESPIRATORY_TRACT | 0 refills | Status: DC | PRN

## 2021-06-24 MED ORDER — GUAIFENESIN-CODEINE 100-10 MG/5ML PO SYRP: 10.0000 mL | ORAL_SOLUTION | Freq: Three times a day (TID) | ORAL | 0 refills | Status: DC | PRN

## 2021-06-24 NOTE — Progress Notes (Signed)
Subjective:    Patient ID: Marcia Chan, female    DOB: 02/13/1971, 50 y.o.   MRN: [id number]  HPI Cough- pt reports sxs since October.  Pt reports she will cough both day and night.  No obvious triggers.  States 'it's a hard cough'.  Pt says cough started as a dry cough but is now productive of very thick mucous balls/plugs.  Pt says 'i don't feel right'.  'my whole face hurts'.  +body aches.  Taking Methocarbamol, Aleve/Motrin.  Has had 3 days of palpitations.  No fevers.  + HAs.  Denies ear pain.   Review of Systems For ROS see HPI   This visit occurred during the SARS-CoV-2 public health emergency.  Safety protocols were in place, including screening questions prior to the visit, additional usage of staff PPE, and extensive cleaning of exam room while observing appropriate contact time as indicated for disinfecting solutions.      Objective:   Physical Exam Vitals reviewed.  Constitutional:      General: She is not in acute distress.    Appearance: She is well-developed. She is obese. She is not ill-appearing.  HENT:     Head: Normocephalic and atraumatic.     Right Ear: Tympanic membrane normal.     Left Ear: Tympanic membrane normal.     Nose: Mucosal edema and rhinorrhea present.     Right Sinus: Maxillary sinus tenderness and frontal sinus tenderness present.     Left Sinus: Maxillary sinus tenderness and frontal sinus tenderness present.     Mouth/Throat:     Pharynx: Uvula midline. Posterior oropharyngeal erythema present. No oropharyngeal exudate.  Eyes:     Conjunctiva/sclera: Conjunctivae normal.     Pupils: Pupils are equal, round, and reactive to light.  Cardiovascular:     Rate and Rhythm: Normal rate and regular rhythm.     Heart sounds: Normal heart sounds.  Pulmonary:     Effort: Pulmonary effort is normal. No respiratory distress.     Breath sounds: Normal breath sounds. No wheezing.     Comments: No cough heard Musculoskeletal:     Cervical back:  Normal range of motion and neck supple.  Lymphadenopathy:     Cervical: No cervical adenopathy.  Skin:    General: Skin is warm and dry.  Neurological:     General: No focal deficit present.     Mental Status: She is alert and oriented to person, place, and time.     Cranial Nerves: No cranial nerve deficit.     Motor: No weakness.     Coordination: Coordination normal.  Psychiatric:        Mood and Affect: Mood normal.        Behavior: Behavior normal.        Thought Content: Thought content normal.          Assessment & Plan:  Bacterial sinusitis- new.  Pt's sxs worsening rather than improving after a preceding viral illness are consistent w/ subsequent bacterial infxn.  Start abx.  Reviewed supportive care and red flags that should prompt return.  Pt expressed understanding and is in agreement w/ plan.   Productive cough- deteriorated.  Pt had dry cough that started in October but after multiple rounds of viral illness has now become an ongoing hard, productive cough.  Get CXR to assess.  Start Doxy as above.  Cough syrup and albuterol prn.  Will determine if additional workup is needed pending CXR.  Pt expressed  understanding and is in agreement w/ plan.

## 2021-06-24 NOTE — Patient Instructions (Signed)
Go to [address] and get your chest xray done in the basement- no appt needed START the Doxycycline twice daily- take w/ food USE the codeine cough syrup as needed- can cause drowsiness USE the Albuterol inhaler if you start a coughing spell to help open your airways Drink LOTS of fluids REST! Hang in there! Happy Holidays!

## 2021-06-29 ENCOUNTER — Ambulatory Visit: Payer: BC Managed Care – PPO | Admitting: Family Medicine

## 2021-06-29 ENCOUNTER — Telehealth: Payer: Self-pay

## 2021-06-29 NOTE — Telephone Encounter (Signed)
-----   Message from Sheliah Hatch, MD sent at 06/28/2021  7:30 AM EST ----- Thankfully chest xray is normal.  Please let me know how you're feeling

## 2021-06-29 NOTE — Telephone Encounter (Signed)
Spoke to patient and she states that she is feeling better. Drainage and facial pain is better. She is aware of chest xray results

## 2021-07-12 ENCOUNTER — Other Ambulatory Visit: Payer: Self-pay

## 2021-07-12 MED ORDER — PRAVASTATIN SODIUM 40 MG PO TABS: ORAL_TABLET | ORAL | 1 refills | Status: DC

## 2021-07-12 MED ORDER — CARVEDILOL 6.25 MG PO TABS: 6.2500 mg | ORAL_TABLET | Freq: Two times a day (BID) | ORAL | 0 refills | Status: DC

## 2021-07-12 MED ORDER — TRIAMTERENE-HCTZ 37.5-25 MG PO TABS: 1.0000 | ORAL_TABLET | Freq: Every day | ORAL | 0 refills | Status: DC

## 2021-07-13 ENCOUNTER — Other Ambulatory Visit: Payer: Self-pay

## 2021-07-13 ENCOUNTER — Emergency Department (HOSPITAL_BASED_OUTPATIENT_CLINIC_OR_DEPARTMENT_OTHER): Payer: BC Managed Care – PPO

## 2021-07-13 ENCOUNTER — Encounter (HOSPITAL_BASED_OUTPATIENT_CLINIC_OR_DEPARTMENT_OTHER): Payer: Self-pay

## 2021-07-13 ENCOUNTER — Emergency Department (HOSPITAL_BASED_OUTPATIENT_CLINIC_OR_DEPARTMENT_OTHER)
Admission: EM | Admit: 2021-07-13 | Discharge: 2021-07-13 | Disposition: A | Discharge status: Home / Self Care | Payer: BC Managed Care – PPO | Attending: Emergency Medicine | Admitting: Emergency Medicine

## 2021-07-13 DIAGNOSIS — R0602 Shortness of breath: Secondary | ICD-10-CM | POA: Insufficient documentation

## 2021-07-13 DIAGNOSIS — M79604 Pain in right leg: Secondary | ICD-10-CM

## 2021-07-13 DIAGNOSIS — M79661 Pain in right lower leg: Secondary | ICD-10-CM | POA: Diagnosis present

## 2021-07-13 LAB — CBC
HCT: 37.3 % (ref 36.0–46.0)
Hemoglobin: 12.5 g/dL (ref 12.0–15.0)
MCH: 29.8 pg (ref 26.0–34.0)
MCHC: 33.5 g/dL (ref 30.0–36.0)
MCV: 89 fL (ref 80.0–100.0)
Platelets: 244 10*3/uL (ref 150–400)
RBC: 4.19 MIL/uL (ref 3.87–5.11)
RDW: 12.6 % (ref 11.5–15.5)
WBC: 7 10*3/uL (ref 4.0–10.5)
nRBC: 0 % (ref 0.0–0.2)

## 2021-07-13 LAB — BASIC METABOLIC PANEL
Anion gap: 9 (ref 5–15)
BUN: 12 mg/dL (ref 6–20)
CO2: 25 mmol/L (ref 22–32)
Calcium: 9.1 mg/dL (ref 8.9–10.3)
Chloride: 100 mmol/L (ref 98–111)
Creatinine, Ser: 0.64 mg/dL (ref 0.44–1.00)
GFR, Estimated: >60 mL/min (ref >60)
Glucose, Bld: 88 mg/dL (ref 70–99)
Potassium: 3.5 mmol/L (ref 3.5–5.1)
Sodium: 134 mmol/L — ABNORMAL LOW (ref 135–145)

## 2021-07-13 LAB — D-DIMER, QUANTITATIVE: D-Dimer, Quant: 0.48 ug/mL-FEU (ref 0.00–0.50)

## 2021-07-13 LAB — PREGNANCY, URINE: Preg Test, Ur: NEGATIVE

## 2021-07-13 LAB — TROPONIN I (HIGH SENSITIVITY)
Troponin I (High Sensitivity): <2 ng/L (ref <18)
Troponin I (High Sensitivity): <2 ng/L (ref <18)

## 2021-07-13 MED ORDER — DICLOFENAC SODIUM 50 MG PO TBEC: 50.0000 mg | DELAYED_RELEASE_TABLET | Freq: Two times a day (BID) | ORAL | 0 refills | Status: DC

## 2021-07-13 NOTE — ED Provider Notes (Signed)
[facility] EMERGENCY DEPARTMENT Provider Note   CSN: [id number] Arrival date & time: 07/13/21  1734     History  Chief Complaint  Patient presents with   Leg Pain   Shortness of Breath    Marcia Chan is a 51 y.o. female.  Patient presents from urgent care with concerns for DVT and PE. Patient with right calf pain x 2 days, now located in the shin. Occasional shortness of breath with activity. Patient is not currently short of breath, denies chest pain. 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 She is being followed by her PCP for this complaint. She is somewhat overweight. 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 She recently stopped wearing her orthopedic shoes.  The history is provided by the patient. No language interpreter was used.  Leg Pain Location:  Leg Injury: no   Leg location:  R lower leg Pain details:    Quality:  Aching and throbbing   Radiates to:  Does not radiate   Severity:  Moderate   Onset quality:  Sudden   Duration:  2 days   Timing:  Intermittent   Progression:  Waxing and waning Chronicity:  New Ineffective treatments:  Rest, muscle relaxant and NSAIDs Associated symptoms: no back pain, no fever, no muscle weakness and no swelling   Shortness of Breath Associated symptoms: no abdominal pain, no chest pain, no cough, no fever and no wheezing       Home Medications Prior to Admission medications   Medication Sig Start Date End Date Taking? Authorizing Provider  albuterol (VENTOLIN HFA) 108 (90 Base) MCG/ACT inhaler Inhale 2 puffs into the lungs every 6 (six) hours as needed for wheezing or shortness of breath. 06/24/21   Sheliah Hatch, MD  benzonatate (TESSALON PERLES) 100 MG capsule 1-2 capsules up to twice daily as needed for cough 06/07/21   Terressa Koyanagi, DO  carvedilol (COREG) 6.25 MG tablet Take 1 tablet (6.25 mg total) by  mouth 2 (two) times daily with a meal. 07/12/21   Sheliah Hatch, MD  doxycycline (VIBRA-TABS) 100 MG tablet Take 1 tablet (100 mg total) by mouth 2 (two) times daily. 06/24/21   Sheliah Hatch, MD  guaiFENesin-codeine (ROBITUSSIN AC) 100-10 MG/5ML syrup Take 10 mLs by mouth 3 (three) times daily as needed for cough. 06/24/21   Sheliah Hatch, MD  levonorgestrel (MIRENA) 20 MCG/24HR IUD 1 each by Intrauterine route once.    [provider]  methocarbamol (ROBAXIN) 500 MG tablet Take 1 tablet (500 mg total) by mouth every 8 (eight) hours as needed for muscle spasms. 05/20/21   Sheliah Hatch, MD  pantoprazole (PROTONIX) 40 MG tablet TAKE 1 TABLET(40 MG) BY MOUTH TWICE DAILY Patient taking differently: daily. 07/19/20   Sheliah Hatch, MD  pravastatin (PRAVACHOL) 40 MG tablet TAKE 1 TABLET(40 MG) BY MOUTH DAILY 07/12/21   Sheliah Hatch, MD  triamterene-hydrochlorothiazide (MAXZIDE-25) 37.5-25 MG tablet Take 1 tablet by mouth daily. 07/12/21   Sheliah Hatch, MD      Allergies    Bextra [valdecoxib]    Review of Systems   Review of Systems  Constitutional:  Negative for fever.  Respiratory:  Positive for shortness of breath. Negative for cough and wheezing.   Cardiovascular:  Negative for chest pain and leg swelling.  Gastrointestinal:  Negative for abdominal pain.  Musculoskeletal:  Negative  for back pain.  All other systems reviewed and are negative.  Physical Exam Updated Vital Signs BP 128/76    Pulse 80    Temp 98.7 F (37.1 C) (Oral)    Resp 16    Ht 5\' 7"  (1.702 m)    Wt 131.1 kg    SpO2 100%    BMI 45.26 kg/m  Physical Exam Vitals and nursing note reviewed.  Constitutional:      General: She is not in acute distress.    Appearance: She is well-developed. She is obese. She is not ill-appearing.  HENT:     Head: Normocephalic.     Nose: Nose normal.  Eyes:     Conjunctiva/sclera: Conjunctivae normal.  Cardiovascular:     Rate and  Rhythm: Normal rate and regular rhythm.  Pulmonary:     Effort: Pulmonary effort is normal.     Breath sounds: Normal breath sounds.  Abdominal:     Palpations: Abdomen is soft.  Musculoskeletal:        General: Tenderness present. Normal range of motion.     Cervical back: Normal range of motion.     Right lower leg: Tenderness present.  Skin:    General: Skin is warm and dry.  Neurological:     Mental Status: She is alert and oriented to person, place, and time.  Psychiatric:        Mood and Affect: Mood normal.        Behavior: Behavior normal.    ED Results / Procedures / Treatments   Labs (all labs ordered are listed, but only abnormal results are displayed) Labs Reviewed  BASIC METABOLIC PANEL - Abnormal; Notable for the following components:      Result Value   Sodium 134 (*)    All other components within normal limits  CBC  PREGNANCY, URINE  D-DIMER, QUANTITATIVE  TROPONIN I (HIGH SENSITIVITY)  TROPONIN I (HIGH SENSITIVITY)    EKG None  Radiology DG Chest 2 View  Result Date: 07/13/2021 CLINICAL DATA:  Dyspnea on exertion, shortness of breath EXAM: CHEST - 2 VIEW COMPARISON:  06/24/2021 FINDINGS: Cardiac and mediastinal contours are within normal limits. No focal pulmonary opacity. No pleural effusion or pneumothorax. No acute osseous abnormality. IMPRESSION: No acute cardiopulmonary process. Electronically Signed   By: Wiliam KeAlison  Vasan M.D.   On: 07/13/2021 18:21   US Venous Img Lower Unilateral Right  Result Date: 07/13/2021 CLINICAL DATA:  Calf pain EXAM: RIGHT LOWER EXTREMITY VENOUS DOPPLER ULTRASOUND TECHNIQUE: Gray-scale sonography with compression, as well as color and duplex ultrasound, were performed to evaluate the deep venous system(s) from the level of the common femoral vein through the popliteal and proximal calf veins. COMPARISON:  None. FINDINGS: VENOUS Normal compressibility of the common femoral, superficial femoral, and popliteal veins, as well as  the visualized calf veins, with the peroneal veins not visualized. Visualized portions of profunda femoral vein and great saphenous vein unremarkable. No filling defects to suggest DVT on grayscale or color Doppler imaging. Doppler waveforms show normal direction of venous flow, normal respiratory plasticity and response to augmentation. Limited views of the contralateral common femoral vein are unremarkable. OTHER None. Limitations: Patient body habitus IMPRESSION: No femoropopliteal DVT nor evidence of DVT within the visualized calf veins. Electronically Signed   By: Wiliam KeAlison  Vasan M.D.   On: 07/13/2021 18:43    Procedures Procedures    Medications Ordered in ED Medications - No data to display  ED Course/ Medical Decision Making/ A&P  Medical Decision Making  Patient evaluated with concern for DVT and PE from referral from urgent care. Labs and ultrasound studies reviewed. Normal d-dimer, troponin. LE [place] negative for DVT. Leg pain originally right calf, now primarily along right shin. Patient is overweight, spends majority of day walking on hard surface. Symptoms are more consistent with medial tibial stress.  Conservative therapy recommended and discussed. Patient will be discharged home & is agreeable with above plan. Returns precautions discussed. Pt appears safe for discharge.         Final Clinical Impression(s) / ED Diagnoses Final diagnoses:  Right leg pain    Rx / DC Orders ED Discharge Orders          Ordered    diclofenac (VOLTAREN) 50 MG EC tablet  2 times daily        07/13/21 2131              Felicie Morn, NP 07/13/21 2341    Charlynne Pander, MD 07/18/21 [phone]

## 2021-07-13 NOTE — ED Triage Notes (Signed)
Pt c/o pain to right calf x 2 days-intermittent SOB/DOE x 2 weeks-denies CP/fever/flu sx with SOB-sent from UC for concerns of DVT and PE-NAD-steady gait

## 2021-07-13 NOTE — ED Notes (Signed)
Case discussed with Hulen Luster, PA-orders received

## 2021-07-13 NOTE — Discharge Instructions (Addendum)
Your work-up is reassuring. No indication of pulmonary embolism or DVT of the leg. Leg pain appears to be musculoskeletal in origin. Return to your orthopedic shoes. Please take medication as directed. Follow-up with your primary care provider.

## 2021-07-14 ENCOUNTER — Ambulatory Visit: Payer: BC Managed Care – PPO | Admitting: Registered Nurse

## 2021-08-05 ENCOUNTER — Ambulatory Visit: Payer: BC Managed Care – PPO | Admitting: Family Medicine

## 2021-08-11 ENCOUNTER — Ambulatory Visit: Payer: BC Managed Care – PPO | Admitting: Family Medicine

## 2021-08-22 ENCOUNTER — Ambulatory Visit: Payer: BC Managed Care – PPO | Admitting: Family Medicine

## 2021-08-22 ENCOUNTER — Encounter: Payer: Self-pay | Admitting: Family Medicine

## 2021-08-22 MED ORDER — WEGOVY 0.25 MG/0.5ML ~~LOC~~ SOAJ: 0.2500 mg | SUBCUTANEOUS | 1 refills | Status: DC

## 2021-08-22 NOTE — Progress Notes (Signed)
° °  Subjective:    Patient ID: Marcia Chan, female    DOB: [birth year], 51 y.o.   MRN: [id number]  HPI Obesity- pt is up 7 lbs since her CPE in mid-November.  BMI 45.42.  Pt is frustrated that she regained the weight she lost prior to her knee surgery.  'i don't like this at all'.  Pt reports she just got a gym membership, has started walking w/ a friend.  Pt states she will not consider surgery. Has been to [facility] but this was not helpful for her.  She didn't feel that the provider tried to understand her situation and it was much too prescriptive.  Is open to medication.   Review of Systems For ROS see HPI   This visit occurred during the SARS-CoV-2 public health emergency.  Safety protocols were in place, including screening questions prior to the visit, additional usage of staff PPE, and extensive cleaning of exam room while observing appropriate contact time as indicated for disinfecting solutions.      Objective:   Physical Exam Vitals reviewed.  Constitutional:      General: She is not in acute distress.    Appearance: Normal appearance. She is obese. She is not ill-appearing.  HENT:     Head: Normocephalic and atraumatic.  Eyes:     Extraocular Movements: Extraocular movements intact.     Conjunctiva/sclera: Conjunctivae normal.     Pupils: Pupils are equal, round, and reactive to light.  Cardiovascular:     Rate and Rhythm: Normal rate and regular rhythm.  Pulmonary:     Effort: Pulmonary effort is normal. No respiratory distress.  Skin:    General: Skin is warm and dry.  Neurological:     General: No focal deficit present.     Mental Status: She is alert and oriented to person, place, and time.  Psychiatric:        Mood and Affect: Mood normal.        Behavior: Behavior normal.        Thought Content: Thought content normal.          Assessment & Plan:

## 2021-08-22 NOTE — Patient Instructions (Signed)
Follow up in 4-6 weeks to recheck weight loss progress IF insurance approves and you can afford it, start the [facility] once weekly LIMIT your carbs (no need to eliminate) INCREASE your water intake UNLIMITED fruits and veggies I love that you're walking and holding yourself accountable! Call with any questions or concerns YOU'VE GOT THIS!!!

## 2021-08-23 NOTE — Assessment & Plan Note (Signed)
Deteriorated.  BMI is 45.42  She has regained much of the weight she lost prior to knee surgery.  Has made a decision that current weight is not acceptable.  Has started walking/exercising w/ a friend.  This is important b/c it will hold her accountable.  Again talked about the premise of a low carb diet.  Stressed importance of increased water intake.  Will start Wegovy to try and jump start this process.  Reviewed dosing instructions, possible side effects, and the plan of taking med until she achieves a certain weight and then coming off it to see if she can maintain.  Pt expressed understanding and is in agreement w/ plan.

## 2021-09-15 LAB — HM MAMMOGRAPHY

## 2021-09-29 ENCOUNTER — Ambulatory Visit: Payer: BC Managed Care – PPO | Admitting: Family Medicine

## 2021-10-13 ENCOUNTER — Other Ambulatory Visit: Payer: Self-pay

## 2021-10-13 MED ORDER — CARVEDILOL 6.25 MG PO TABS: 6.2500 mg | ORAL_TABLET | Freq: Two times a day (BID) | ORAL | 0 refills | Status: DC

## 2021-10-13 MED ORDER — TRIAMTERENE-HCTZ 37.5-25 MG PO TABS: 1.0000 | ORAL_TABLET | Freq: Every day | ORAL | 0 refills | Status: DC

## 2021-10-26 ENCOUNTER — Other Ambulatory Visit: Payer: Self-pay

## 2021-10-26 ENCOUNTER — Telehealth: Payer: Self-pay

## 2021-10-26 MED ORDER — PANTOPRAZOLE SODIUM 40 MG PO TBEC: 40.0000 mg | DELAYED_RELEASE_TABLET | Freq: Two times a day (BID) | ORAL | 1 refills | Status: DC

## 2021-10-26 NOTE — Telephone Encounter (Signed)
MEDICATION:pantoprazole (PROTONIX) 40 MG tablet [[id number]]  DISCONTINUED ? ?PHARMACY:[id number] - [place], [place] - [address] ? ?Comments:  ? ?**Let patient know to contact pharmacy at the end of the day to make sure medication is ready. ** ? ?** Please notify patient to allow 48-72 hours to process** ? ?**Encourage patient to contact the pharmacy for refills or they can request refills through [facility]** ?  ?

## 2021-10-26 NOTE — Telephone Encounter (Signed)
Sent!

## 2021-10-27 ENCOUNTER — Ambulatory Visit: Payer: BC Managed Care – PPO | Admitting: Family Medicine

## 2021-10-27 ENCOUNTER — Encounter: Payer: Self-pay | Admitting: Family Medicine

## 2021-10-27 VITALS — BP 128/80 | HR 75 | Temp 97.1°F | Resp 18 | Ht 67.5 in | Wt 288.2 lb

## 2021-10-27 DIAGNOSIS — F4323 Adjustment disorder with mixed anxiety and depressed mood: Secondary | ICD-10-CM

## 2021-10-27 DIAGNOSIS — G4700 Insomnia, unspecified: Secondary | ICD-10-CM | POA: Diagnosis not present

## 2021-10-27 DIAGNOSIS — M62830 Muscle spasm of back: Secondary | ICD-10-CM

## 2021-10-27 MED ORDER — TRAZODONE HCL 50 MG PO TABS: 25.0000 mg | ORAL_TABLET | Freq: Every evening | ORAL | 3 refills | Status: DC | PRN

## 2021-10-27 MED ORDER — TIZANIDINE HCL 4 MG PO TABS: 4.0000 mg | ORAL_TABLET | Freq: Four times a day (QID) | ORAL | 0 refills | Status: DC | PRN

## 2021-10-27 NOTE — Patient Instructions (Signed)
Follow up in 2-3 weeks to recheck mood ?START the Tizanidine (Zanaflex) as needed for spasm ?HEAT those sore muscles!!! ?Massage if possible ?USE the Trazodone nightly for sleep.  Start w/ 1/2 tab nightly and increase to 1 tab daily ?Call with any questions or concerns ?Hang in there!!! ?

## 2021-10-27 NOTE — Progress Notes (Signed)
? ?  Subjective:  ? ? Patient ID: Marcia Chan, female    DOB: 09-08-1970, 51 y.o.   MRN: [id number] ? ?HPI ?Insomnia- 'i am not sleeping.  I have very little left in me'.  Mom has been in and out of the hospital for the last 2 weeks.  She has been sleeping in hospital chairs, couches.  Now having pain in back, neck, shoulders, legs.  Taking Motrin or Aleve w/ only mild and temporary improvement.  No relief w/ Methocarbamol. ? ? ?Review of Systems ?For ROS see HPI  ?   ?Objective:  ? Physical Exam ?Vitals reviewed.  ?Constitutional:   ?   General: She is not in acute distress. ?   Appearance: She is obese. She is not ill-appearing.  ?HENT:  ?   Head: Normocephalic and atraumatic.  ?Skin: ?   General: Skin is warm and dry.  ?Neurological:  ?   General: No focal deficit present.  ?   Mental Status: She is alert and oriented to person, place, and time.  ?Psychiatric:  ?   Comments: Appears exhausted and overwhelmed, anxious  ? ? ? ? ? ?   ?Assessment & Plan:  ? ?Insomnia- new.  Suspect this is mostly situational given that she has been sleeping in couches and hospital rooms.  She is very anxious about her mom's health situation.  Now she is having back and neck pain from her recent sleeping arrangements.  Start Trazodone for sleep and treat underlying muscle pain/spasm ? ?Muscle spasm- new.  Neck and back.  Due to poor sleeping arrangements.  Start Tizanidine.  Encouraged heating pad, massage, gentle stretching. ? ?Adjustment d/o- pt is understandably overwhelmed and anxious.  She feels that her anxiety and mood would improve if she were able to sleep.  Start Trazodone and monitor b/c we may need to add SSRI if sxs don't improve.  Pt expressed understanding and is in agreement w/ plan.  ?

## 2021-11-04 ENCOUNTER — Encounter: Payer: Self-pay | Admitting: Family Medicine

## 2021-11-18 ENCOUNTER — Telehealth (INDEPENDENT_AMBULATORY_CARE_PROVIDER_SITE_OTHER): Payer: BC Managed Care – PPO | Admitting: Family Medicine

## 2021-11-18 ENCOUNTER — Encounter: Payer: Self-pay | Admitting: Family Medicine

## 2021-11-18 DIAGNOSIS — F4323 Adjustment disorder with mixed anxiety and depressed mood: Secondary | ICD-10-CM

## 2021-11-18 DIAGNOSIS — G4700 Insomnia, unspecified: Secondary | ICD-10-CM

## 2021-11-18 NOTE — Progress Notes (Signed)
Virtual Visit via Video   I connected with patient on 11/18/21 at  1:40 PM EDT by a video enabled telemedicine application and verified that I am speaking with the correct person using two identifiers.  Location patient: Home Location provider: [facility] Persons participating in the virtual visit: Patient, Provider, CMA Sheryle Hail C)  I discussed the limitations of evaluation and management by telemedicine and the availability of in person appointments. The patient expressed understanding and agreed to proceed.  Subjective:   HPI:   Insomnia- at last visit we started Trazodone.  Pt reports sleeping better, which has improved mood and overall well being.  Pt is able to sleep through the night- 'i haven't done that in a long time'.  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  ROS:   See pertinent positives and negatives per HPI.  Patient Active Problem List   Diagnosis Date Noted   Unilateral primary osteoarthritis, right hip 12/27/2020   Pain in joint of right hip 07/15/2020   Palpitations 09/19/2019   Morbid obesity ([facility]) 09/19/2019   Cardiac murmur 09/19/2019   Anxiety 05/01/2019   Primary osteoarthritis of both knees 08/30/2018   Vitamin D deficiency 12/14/2016   Plantar fasciitis of left foot 01/06/2014   Numbness and tingling of left leg 06/09/2013   Lumbar back pain 12/23/2012   Lateral epicondylitis 12/23/2012   GERD (gastroesophageal reflux disease) 05/03/2012   Seasonal allergic rhinitis 10/08/2011   General medical examination 10/06/2011   Hair loss 02/21/2011   HTN (hypertension) 02/10/2011   Hyperlipidemia 02/10/2011   Nail fungus 02/10/2011    Social History   Tobacco Use   Smoking status: Never   Smokeless tobacco: Never  Substance Use Topics   Alcohol use: No    Current Outpatient Medications:    carvedilol (COREG) 6.25 MG tablet, Take 1 tablet (6.25 mg total) by mouth 2 (two)  times daily with a meal., Disp: 180 tablet, Rfl: 0   levonorgestrel (MIRENA) 20 MCG/24HR IUD, 1 each by Intrauterine route once., Disp: , Rfl:    pantoprazole (PROTONIX) 40 MG tablet, Take 1 tablet (40 mg total) by mouth 2 (two) times daily. TAKE 1 TABLET(40 MG) BY MOUTH TWICE DAILY Strength: 40 mg, Disp: 180 tablet, Rfl: 1   pravastatin (PRAVACHOL) 40 MG tablet, TAKE 1 TABLET(40 MG) BY MOUTH DAILY, Disp: 90 tablet, Rfl: 1   tiZANidine (ZANAFLEX) 4 MG tablet, Take 1 tablet (4 mg total) by mouth every 6 (six) hours as needed for muscle spasms., Disp: 30 tablet, Rfl: 0   traZODone (DESYREL) 50 MG tablet, Take 0.5-1 tablets (25-50 mg total) by mouth at bedtime as needed for sleep., Disp: 30 tablet, Rfl: 3   triamterene-hydrochlorothiazide (MAXZIDE-25) 37.5-25 MG tablet, Take 1 tablet by mouth daily., Disp: 90 tablet, Rfl: 0   albuterol (VENTOLIN HFA) 108 (90 Base) MCG/ACT inhaler, Inhale 2 puffs into the lungs every 6 (six) hours as needed for wheezing or shortness of breath. (Patient not taking: Reported on 11/18/2021), Disp: 8 g, Rfl: 0   Semaglutide-Weight Management (WEGOVY) 0.25 MG/0.5ML SOAJ, Inject 0.25 mg into the skin once a week. (Patient not taking: Reported on 11/18/2021), Disp: 2 mL, Rfl: 1  Allergies  Allergen Reactions   Bextra [Valdecoxib]     Rash     Objective:   There were no vitals taken for this visit. AAOx3, NAD NCAT, EOMI No obvious CN deficits Coloring WNL Pt is able to  speak clearly, coherently without shortness of breath or increased work of breathing.  Thought process is linear.  Mood is appropriate.   Assessment and Plan:   Insomnia- much improved since starting Trazodone.  She is sleeping through the night for the first time in quite awhile.  Reports this has translated to improved mood and feeling better physically.  She still has anxiety about mom's ongoing health issues but she feels that she can get to the end of the school year (2 weeks) and then she will  have more time to focus on mom.  She is not interested in starting a med for depression or anxiety at this time but will let me know.   Neena Rhymes, MD 11/18/2021

## 2022-01-09 ENCOUNTER — Other Ambulatory Visit: Payer: Self-pay

## 2022-01-09 DIAGNOSIS — I10 Essential (primary) hypertension: Secondary | ICD-10-CM

## 2022-01-09 MED ORDER — PRAVASTATIN SODIUM 40 MG PO TABS: ORAL_TABLET | ORAL | 1 refills | Status: DC

## 2022-01-09 MED ORDER — TRIAMTERENE-HCTZ 37.5-25 MG PO TABS: 1.0000 | ORAL_TABLET | Freq: Every day | ORAL | 0 refills | Status: DC

## 2022-01-09 MED ORDER — CARVEDILOL 6.25 MG PO TABS: 6.2500 mg | ORAL_TABLET | Freq: Two times a day (BID) | ORAL | 0 refills | Status: DC

## 2022-02-08 ENCOUNTER — Encounter (INDEPENDENT_AMBULATORY_CARE_PROVIDER_SITE_OTHER): Payer: Self-pay

## 2022-03-27 ENCOUNTER — Encounter: Payer: Self-pay | Admitting: Family Medicine

## 2022-03-27 ENCOUNTER — Ambulatory Visit: Payer: BC Managed Care – PPO | Admitting: Family Medicine

## 2022-03-27 VITALS — BP 118/78 | HR 78 | Temp 98.6°F | Ht 67.0 in | Wt 297.6 lb

## 2022-03-27 DIAGNOSIS — M549 Dorsalgia, unspecified: Secondary | ICD-10-CM

## 2022-03-27 DIAGNOSIS — R3915 Urgency of urination: Secondary | ICD-10-CM

## 2022-03-27 LAB — POCT URINALYSIS DIP (MANUAL ENTRY)
Bilirubin, UA: NEGATIVE
Blood, UA: NEGATIVE
Glucose, UA: NEGATIVE mg/dL
Ketones, POC UA: NEGATIVE mg/dL
Nitrite, UA: NEGATIVE
Spec Grav, UA: 1.015
Urobilinogen, UA: NEGATIVE U/dL — AB
pH, UA: 6

## 2022-03-27 MED ORDER — CYCLOBENZAPRINE HCL 5 MG PO TABS: 5.0000 mg | ORAL_TABLET | Freq: Three times a day (TID) | ORAL | 0 refills | Status: DC | PRN

## 2022-03-27 NOTE — Patient Instructions (Addendum)
See information below on back pain.  Short-term anti-inflammatory like ibuprofen is okay for now.  Continue to drink plenty of fluids.  I will check some other urine tests, but okay to hold on antibiotics for now as long as your urine is improving.  Return to the clinic or go to the nearest emergency room if any of your symptoms worsen or new symptoms occur.   Acute Back Pain, Adult Acute back pain is sudden and usually short-lived. It is often caused by an injury to the muscles and tissues in the back. The injury may result from: A muscle, tendon, or ligament getting overstretched or torn. Ligaments are tissues that connect bones to each other. Lifting something improperly can cause a back strain. Wear and tear (degeneration) of the spinal disks. Spinal disks are circular tissue that provide cushioning between the bones of the spine (vertebrae). Twisting motions, such as while playing sports or doing yard work. A hit to the back. Arthritis. You may have a physical exam, lab tests, and imaging tests to find the cause of your pain. Acute back pain usually goes away with rest and home care. Follow these instructions at home: Managing pain, stiffness, and swelling Take over-the-counter and prescription medicines only as told by your health care provider. Treatment may include medicines for pain and inflammation that are taken by mouth or applied to the skin, or muscle relaxants. Your health care provider may recommend applying ice during the first 24-48 hours after your pain starts. To do this: Put ice in a plastic bag. Place a towel between your skin and the bag. Leave the ice on for 20 minutes, 2-3 times a day. Remove the ice if your skin turns bright red. This is very important. If you cannot feel pain, heat, or cold, you have a greater risk of damage to the area. If directed, apply heat to the affected area as often as told by your health care provider. Use the heat source that your health care  provider recommends, such as a moist heat pack or a heating pad. Place a towel between your skin and the heat source. Leave the heat on for 20-30 minutes. Remove the heat if your skin turns bright red. This is especially important if you are unable to feel pain, heat, or cold. You have a greater risk of getting burned. Activity  Do not stay in bed. Staying in bed for more than 1-2 days can delay your recovery. Sit up and stand up straight. Avoid leaning forward when you sit or hunching over when you stand. If you work at a desk, sit close to it so you do not need to lean over. Keep your chin tucked in. Keep your neck drawn back, and keep your elbows bent at a 90-degree angle (right angle). Sit high and close to the steering wheel when you drive. Add lower back (lumbar) support to your car seat, if needed. Take short walks on even surfaces as soon as you are able. Try to increase the length of time you walk each day. Do not sit, drive, or stand in one place for more than 30 minutes at a time. Sitting or standing for long periods of time can put stress on your back. Do not drive or use heavy machinery while taking prescription pain medicine. Use proper lifting techniques. When you bend and lift, use positions that put less stress on your back: [place] your knees. Keep the load close to your body. Avoid twisting. Exercise regularly as  told by your health care provider. Exercising helps your back heal faster and helps prevent back injuries by keeping muscles strong and flexible. Work with a physical therapist to make a safe exercise program, as recommended by your health care provider. Do any exercises as told by your physical therapist. Lifestyle Maintain a healthy weight. Extra weight puts stress on your back and makes it difficult to have good posture. Avoid activities or situations that make you feel anxious or stressed. Stress and anxiety increase muscle tension and can make back pain worse. Learn  ways to manage anxiety and stress, such as through exercise. General instructions Sleep on a firm mattress in a comfortable position. Try lying on your side with your knees slightly bent. If you lie on your back, put a pillow under your knees. Keep your head and neck in a straight line with your spine (neutral position) when using electronic equipment like smartphones or pads. To do this: Raise your smartphone or pad to look at it instead of bending your head or neck to look down. Put the smartphone or pad at the level of your face while looking at the screen. Follow your treatment plan as told by your health care provider. This may include: Cognitive or behavioral therapy. Acupuncture or massage therapy. Meditation or yoga. Contact a health care provider if: You have pain that is not relieved with rest or medicine. You have increasing pain going down into your legs or buttocks. Your pain does not improve after 2 weeks. You have pain at night. You lose weight without trying. You have a fever or chills. You develop nausea or vomiting. You develop abdominal pain. Get help right away if: You develop new bowel or bladder control problems. You have unusual weakness or numbness in your arms or legs. You feel faint. These symptoms may represent a serious problem that is an emergency. Do not wait to see if the symptoms will go away. Get medical help right away. Call your local emergency services (911 in the [place] Do not drive yourself to the hospital. Summary Acute back pain is sudden and usually short-lived. Use proper lifting techniques. When you bend and lift, use positions that put less stress on your back. Take over-the-counter and prescription medicines only as told by your health care provider, and apply heat or ice as told. This information is not intended to replace advice given to you by your health care provider. Make sure you discuss any questions you have with your health care  provider. Document Revised: 09/10/2020 Document Reviewed: 09/10/2020 Elsevier Patient Education  2023 [employer].

## 2022-03-27 NOTE — Progress Notes (Unsigned)
Subjective:  Patient ID: Marcia Chan, female    DOB: 02/25/1971  Age: 51 y.o. MRN: [id number]  CC:  Chief Complaint  Patient presents with   Back Pain    Possible UTI, or pulled muscle, pt has taken advil for 2 days and motrin for 2 day, pt states it does not hurt to urinate     HPI Marcia Chan presents for   Back Pain: Started 2 days ago. R mid back. 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 No fever. No cough or dyspnea.  Sense of urgency with urination, then minimal amount with odor. Darker urine. Improved with more fluids.  No abd pain/n/v.  No dysuria,  no hematuria No hx of kidney stones.  Prior low back pain, usually lower. No radiation of pain. No rash.  Tx: advil, motrin - some improvement.  No bowel or bladder incontinence, no saddle anesthesia, no lower extremity weakness.   No fever/night sweats.  Trouble to sleep d/t discomfort.    History Patient Active Problem List   Diagnosis Date Noted   Unilateral primary osteoarthritis, right hip 12/27/2020   Pain in joint of right hip 07/15/2020   Palpitations 09/19/2019   Morbid obesity ([place]) 09/19/2019   Cardiac murmur 09/19/2019   Anxiety 05/01/2019   Primary osteoarthritis of both knees 08/30/2018   Vitamin D deficiency 12/14/2016   Plantar fasciitis of left foot 01/06/2014   Numbness and tingling of left leg 06/09/2013   Lumbar back pain 12/23/2012   Lateral epicondylitis 12/23/2012   GERD (gastroesophageal reflux disease) 05/03/2012   Seasonal allergic rhinitis 10/08/2011   General medical examination 10/06/2011   Hair loss 02/21/2011   HTN (hypertension) 02/10/2011   Hyperlipidemia 02/10/2011   Nail fungus 02/10/2011   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 Authorizing Provider  carvedilol (COREG) 6.25 MG tablet Take 1 tablet (6.25 mg total) by mouth 2 (two) times daily with a meal. 01/09/22  Yes Midge Minium, MD  levonorgestrel (MIRENA) 20 MCG/24HR IUD 1 each by Intrauterine route once.   Yes [provider]  pantoprazole (PROTONIX) 40 MG tablet Take 1 tablet (40 mg total) by mouth 2 (two) times daily. TAKE 1 TABLET(40 MG) BY MOUTH TWICE DAILY Strength: 40 mg 10/26/21  Yes Midge Minium, MD  pravastatin (PRAVACHOL) 40 MG tablet  TAKE 1 TABLET(40 MG) BY MOUTH DAILY 01/09/22  Yes Midge Minium, MD  triamterene-hydrochlorothiazide (MAXZIDE-25) 37.5-25 MG tablet Take 1 tablet by mouth daily. 01/09/22  Yes Midge Minium, MD  albuterol (VENTOLIN HFA) 108 (90 Base) MCG/ACT inhaler Inhale 2 puffs into the lungs every 6 (six) hours as needed for wheezing or shortness of breath. Patient not taking: Reported on 11/18/2021 06/24/21   Midge Minium, MD  Semaglutide-Weight Management (WEGOVY) 0.25 MG/0.5ML SOAJ Inject 0.25 mg into the skin once a week. Patient not taking: Reported on 11/18/2021 08/22/21   Midge Minium, MD  tiZANidine  (ZANAFLEX) 4 MG tablet Take 1 tablet (4 mg total) by mouth every 6 (six) hours as needed for muscle spasms. 10/27/21   Midge Minium, MD  traZODone (DESYREL) 50 MG tablet Take 0.5-1 tablets (25-50 mg total) by mouth at bedtime as needed for sleep. 10/27/21   Midge Minium, MD   Social History   Socioeconomic History   Marital status: Married    Spouse name: Not on file   Number of children: Not on file   Years of education: Not on file   Highest education level: Not on file  Occupational History   Occupation: Teacher  Tobacco Use   Smoking status: Never   Smokeless tobacco: Never  Vaping Use   Vaping Use: Never used  Substance and Sexual Activity   Alcohol use: No   Drug use: No   Sexual activity: Not on file  Other Topics Concern   Not on file  Social History Narrative   G3 P1 A2   Social Determinants of Health   Financial Resource Strain: Not on file  Food Insecurity: Not on file  Transportation Needs: Not on file  Physical Activity: Not on file  Stress: Not on file  Social Connections: Not on file  Intimate Partner Violence: Not on file    Review of Systems Per HPI.   Objective:   Vitals:   03/27/22 1341  BP: 118/78  Pulse: 78  Temp: 98.6 F (37 C)  SpO2: 98%  Weight: 297 lb 9.6 oz (135 kg)  Height: 5\' 7"  (1.702 m)     Physical Exam Vitals reviewed.  Constitutional:      Appearance: Normal appearance. She is well-developed.  HENT:     Head: Normocephalic and atraumatic.  Eyes:     Conjunctiva/sclera: Conjunctivae normal.     Pupils: Pupils are equal, round, and reactive to light.  Neck:     Vascular: No carotid bruit.  Cardiovascular:     Rate and Rhythm: Normal rate and regular rhythm.     Heart sounds: Normal heart sounds.  Pulmonary:     Effort: Pulmonary effort is normal.     Breath sounds: Normal breath sounds.  Abdominal:     General: There is no distension.     Palpations: Abdomen is soft. There is no pulsatile mass.      Tenderness: There is no abdominal tenderness. There is no right CVA tenderness or left CVA tenderness.  Musculoskeletal:     Right lower leg: No edema.     Left lower leg: No edema.     Comments: Reproducible pain of the paraspinals, right mid back.  No focal bony tenderness.  No CVA tenderness.  Skin:    General: Skin is warm and dry.  Neurological:     Mental Status: She is alert and oriented to person, place, and time.  Psychiatric:  Mood and Affect: Mood normal.        Behavior: Behavior normal.     Results for orders placed or performed in visit on 03/27/22  POCT urinalysis dipstick  Result Value Ref Range   Color, UA yellow yellow   Clarity, UA clear clear   Glucose, UA negative negative mg/dL   Bilirubin, UA negative negative   Ketones, POC UA negative negative mg/dL   Spec Grav, UA 1.015 1.010 - 1.025   Blood, UA negative negative   pH, UA 6.0 5.0 - 8.0   Protein Ur, POC trace (A) negative mg/dL   Urobilinogen, UA negative (A) 0.2 or 1.0 E.U./dL   Nitrite, UA Negative Negative   Leukocytes, UA Trace (A) Negative      Assessment & Plan:  Marcia Chan is a 51 y.o. female . No diagnosis found.   No orders of the defined types were placed in this encounter.  There are no Patient Instructions on file for this visit.    Signed,   Merri Ray, MD [place] 03/27/22 1:52 PM

## 2022-03-28 ENCOUNTER — Telehealth: Payer: Self-pay | Admitting: Family Medicine

## 2022-03-28 LAB — URINALYSIS, MICROSCOPIC ONLY: RBC / HPF: NONE SEEN (ref 0–?)

## 2022-03-28 LAB — URINE CULTURE
MICRO NUMBER:: 13963358
SPECIMEN QUALITY:: ADEQUATE

## 2022-03-28 NOTE — Telephone Encounter (Signed)
Caller name: Manus Gunning  On DPR? :yes/no: Yes  Call back number: [phone]  Provider they see: Birdie Riddle  Reason for call: Calling to get results of urine test from 03/27/22. Advised that rx for flexeril was sent to [facility]; pt also states that AVS not uploaded to her mychart yet.

## 2022-03-28 NOTE — Telephone Encounter (Signed)
Pt is asking about the results of urine I see culture is still pending, anything I can inform her of from the micro?  Also pt is asking about AVS not being available in MyChart at this time

## 2022-03-29 ENCOUNTER — Encounter: Payer: Self-pay | Admitting: Family Medicine

## 2022-03-29 ENCOUNTER — Telehealth: Payer: Self-pay

## 2022-03-29 DIAGNOSIS — M549 Dorsalgia, unspecified: Secondary | ICD-10-CM

## 2022-03-29 NOTE — Telephone Encounter (Signed)
-----   Message from Wendie Agreste, MD sent at 03/29/2022  4:29 PM EDT ----- Results sent by MyChart, but can call to make sure patient received results.  Let me know if there are questions.

## 2022-03-29 NOTE — Telephone Encounter (Signed)
I spoke w/ the lab and she states she sent the urine off on Monday . I explained to the pt yesterday afternoon that it may be back and that DR Carlota Raspberry may not have reviewed the results yet . Could you check and pt is stating the Flexeril is not helping her Please advise

## 2022-03-29 NOTE — Telephone Encounter (Signed)
Culture is back can you please review so I may call the patient

## 2022-03-29 NOTE — Telephone Encounter (Signed)
Pt called stating that the flexeril 5 mg isn't helping with her pain and she still waiting to hear about her results of urine test. Pt states that she like to hear about her urine test results this morning.

## 2022-03-29 NOTE — Telephone Encounter (Signed)
Spoke w/ pt  and advised of the Urine results . She ask what can she do about her back pain that the Flexeril is not helping . I suggested a visit  but she would like your opinion

## 2022-03-29 NOTE — Telephone Encounter (Signed)
Notes in lab results. Thanks!

## 2022-03-30 MED ORDER — MELOXICAM 7.5 MG PO TABS
7.5000 mg | ORAL_TABLET | Freq: Every day | ORAL | 0 refills | Status: DC | PRN
Start: 2022-03-30 — End: 2023-03-06

## 2022-03-30 NOTE — Addendum Note (Signed)
Addended by: Merri Ray R on: 03/30/2022 06:13 PM   Modules accepted: Orders

## 2022-03-30 NOTE — Telephone Encounter (Signed)
Called patient.  Back feels about the same. Taking flexeril - one at bedtime  at 9pm and again at 4am. Taking 2 per night.  No relief of pain, hurts to turn over. Same area.  No bowel or bladder incontinence, no saddle anesthesia, no lower extremity weakness. Still in same muscle area. No leg radiation.  Some constipation, no n/v. No abd pain.  Urinating more frequently 3-4 each afternoon, nocturia once per night. No dysuria, no hematuria. Slight vaginal irritation and itching. No vaginal Has visit scheduled with OB next  week.  No history of kidney stones.  Feels like urine more frequent as more water intake. No new sx's.  Ibuprofen 600mg  at bedtime.  - has taken mobic in past and tolerated.   Option of otc monistat cream for possible yeast infection with itching. Colace or miralax for constipation.  Mobic 7.5-15mg  QD. Avoid other nsaids.  She will try heat/heating pad.  Urgent care eval if not improving in next 2 days to consider other testing or imaging.  RTC/ER precautions if worse sooner.understanding expressed.

## 2022-04-06 ENCOUNTER — Ambulatory Visit (INDEPENDENT_AMBULATORY_CARE_PROVIDER_SITE_OTHER): Payer: BC Managed Care – PPO | Admitting: Family Medicine

## 2022-04-06 ENCOUNTER — Other Ambulatory Visit: Payer: Self-pay

## 2022-04-06 ENCOUNTER — Encounter: Payer: Self-pay | Admitting: Family Medicine

## 2022-04-06 VITALS — BP 126/84 | HR 82 | Temp 97.9°F | Resp 16 | Ht 67.0 in | Wt 292.1 lb

## 2022-04-06 DIAGNOSIS — R109 Unspecified abdominal pain: Secondary | ICD-10-CM | POA: Diagnosis not present

## 2022-04-06 DIAGNOSIS — M545 Low back pain, unspecified: Secondary | ICD-10-CM

## 2022-04-06 DIAGNOSIS — I10 Essential (primary) hypertension: Secondary | ICD-10-CM

## 2022-04-06 LAB — POCT URINALYSIS DIPSTICK
Glucose, UA: NEGATIVE
Leukocytes, UA: NEGATIVE
Protein, UA: NEGATIVE
Spec Grav, UA: 1.01 (ref 1.010–1.025)
Urobilinogen, UA: 0.2 E.U./dL
pH, UA: 6 (ref 5.0–8.0)

## 2022-04-06 MED ORDER — PRAVASTATIN SODIUM 40 MG PO TABS: ORAL_TABLET | ORAL | 1 refills | Status: DC

## 2022-04-06 MED ORDER — METHOCARBAMOL 500 MG PO TABS: 500.0000 mg | ORAL_TABLET | Freq: Three times a day (TID) | ORAL | 0 refills | Status: DC | PRN

## 2022-04-06 MED ORDER — TRIAMTERENE-HCTZ 37.5-25 MG PO TABS: 1.0000 | ORAL_TABLET | Freq: Every day | ORAL | 1 refills | Status: DC

## 2022-04-06 MED ORDER — PREDNISONE 10 MG PO TABS: ORAL_TABLET | ORAL | 0 refills | Status: DC

## 2022-04-06 MED ORDER — TRAMADOL HCL 50 MG PO TABS: 50.0000 mg | ORAL_TABLET | Freq: Three times a day (TID) | ORAL | 0 refills | Status: AC | PRN

## 2022-04-06 MED ORDER — CARVEDILOL 6.25 MG PO TABS: 6.2500 mg | ORAL_TABLET | Freq: Two times a day (BID) | ORAL | 1 refills | Status: DC

## 2022-04-06 NOTE — Progress Notes (Signed)
   Subjective:    Patient ID: Marcia Chan, female    DOB: 05-May-1971, 51 y.o.   MRN: [id number]  HPI Flank pain- new.  R sided.  Sxs started on 9/23 'out of the blue'.  No known injury but pt was helping to reposition mom in hospital bed.  'it was hurting so bad I couldn't move'.  Saw Dr Carlota Raspberry on 9/25.  No relief w/ muscle relaxers or Meloxicam.  No relief w/ heating pad.  Pain at rest is described as an 'ache' when she moves 'it's like someone stabbing me'.  Pt reports pain has 'shifted' from R back to R flank.  No hx of kidney stone.  Hx of gallstones.  No N/V.  No change in pain w/ eating.  No skin changes or rash.   Review of Systems For ROS see HPI     Objective:   Physical Exam Vitals reviewed.  Constitutional:      General: She is in acute distress (obviously in pain).     Appearance: Normal appearance. She is not ill-appearing.  HENT:     Head: Normocephalic and atraumatic.  Eyes:     Extraocular Movements: Extraocular movements intact.     Conjunctiva/sclera: Conjunctivae normal.     Pupils: Pupils are equal, round, and reactive to light.  Abdominal:     General: There is no distension.     Palpations: Abdomen is soft.     Tenderness: There is no abdominal tenderness. There is right CVA tenderness. There is no guarding or rebound.  Musculoskeletal:        General: Tenderness (TTP from R CVA around to midaxillary line) present.  Skin:    General: Skin is warm and dry.     Findings: No lesion or rash.  Neurological:     General: No focal deficit present.     Mental Status: She is alert and oriented to person, place, and time.  Psychiatric:        Mood and Affect: Mood normal.        Behavior: Behavior normal.        Thought Content: Thought content normal.           Assessment & Plan:  R flank pain- new.  No evidence of blood in urine which I would expect if this were a kidney stone.  Radiation of pain is consistent w/ stone but it would be very odd that  there is no hematuria.  Will start Prednisone taper to treat musculoskeletal pain as well as Methocarbamol and Tramadol.  Reviewed supportive care and red flags that should prompt return.  Pt expressed understanding and is in agreement w/ plan.

## 2022-04-06 NOTE — Patient Instructions (Signed)
Follow up as needed or as scheduled START the Prednisone as directed- take w/ food USE the methocarbamol as a muscle relaxer TAKE the Tramadol as needed for severe pain Continue to ice or heat- whichever feels better The Tramadol will possibly worsen constipation.  Take 2 Sennokot pills nightly in addition to Miralax once daily.  If not working, we call [facility] (milk of magnesia) Call with any questions or concerns Hang in there!!!

## 2022-04-08 ENCOUNTER — Emergency Department (HOSPITAL_BASED_OUTPATIENT_CLINIC_OR_DEPARTMENT_OTHER): Payer: BC Managed Care – PPO

## 2022-04-08 ENCOUNTER — Emergency Department (HOSPITAL_BASED_OUTPATIENT_CLINIC_OR_DEPARTMENT_OTHER)
Admission: EM | Admit: 2022-04-08 | Discharge: 2022-04-08 | Disposition: A | Discharge status: Home / Self Care | Payer: BC Managed Care – PPO | Attending: Emergency Medicine | Admitting: Emergency Medicine

## 2022-04-08 ENCOUNTER — Other Ambulatory Visit: Payer: Self-pay

## 2022-04-08 ENCOUNTER — Encounter (HOSPITAL_BASED_OUTPATIENT_CLINIC_OR_DEPARTMENT_OTHER): Payer: Self-pay | Admitting: Emergency Medicine

## 2022-04-08 DIAGNOSIS — I10 Essential (primary) hypertension: Secondary | ICD-10-CM | POA: Insufficient documentation

## 2022-04-08 DIAGNOSIS — R109 Unspecified abdominal pain: Secondary | ICD-10-CM | POA: Diagnosis present

## 2022-04-08 DIAGNOSIS — K8020 Calculus of gallbladder without cholecystitis without obstruction: Secondary | ICD-10-CM | POA: Diagnosis not present

## 2022-04-08 LAB — COMPREHENSIVE METABOLIC PANEL
ALT: 17 U/L (ref 0–44)
AST: 20 U/L (ref 15–41)
Albumin: 3.7 g/dL (ref 3.5–5.0)
Alkaline Phosphatase: 57 U/L (ref 38–126)
Anion gap: 7 (ref 5–15)
BUN: 10 mg/dL (ref 6–20)
CO2: 27 mmol/L (ref 22–32)
Calcium: 8.8 mg/dL — ABNORMAL LOW (ref 8.9–10.3)
Chloride: 104 mmol/L (ref 98–111)
Creatinine, Ser: 0.64 mg/dL (ref 0.44–1.00)
GFR, Estimated: >60 mL/min (ref >60)
Glucose, Bld: 107 mg/dL — ABNORMAL HIGH (ref 70–99)
Potassium: 3.5 mmol/L (ref 3.5–5.1)
Sodium: 138 mmol/L (ref 135–145)
Total Bilirubin: 0.5 mg/dL (ref 0.3–1.2)
Total Protein: 7.5 g/dL (ref 6.5–8.1)

## 2022-04-08 LAB — PREGNANCY, URINE: Preg Test, Ur: NEGATIVE

## 2022-04-08 LAB — URINALYSIS, ROUTINE W REFLEX MICROSCOPIC
Glucose, UA: NEGATIVE mg/dL
Hgb urine dipstick: NEGATIVE
Ketones, ur: NEGATIVE mg/dL
Leukocytes,Ua: NEGATIVE
Nitrite: NEGATIVE
Protein, ur: NEGATIVE mg/dL
Specific Gravity, Urine: 1.025 (ref 1.005–1.030)
pH: 6 (ref 5.0–8.0)

## 2022-04-08 LAB — CBC WITH DIFFERENTIAL/PLATELET
Abs Immature Granulocytes: 0.04 10*3/uL (ref 0.00–0.07)
Basophils Absolute: 0 10*3/uL (ref 0.0–0.1)
Basophils Relative: 0 %
Eosinophils Absolute: 0.1 10*3/uL (ref 0.0–0.5)
Eosinophils Relative: 1 %
HCT: 36.4 % (ref 36.0–46.0)
Hemoglobin: 12.3 g/dL (ref 12.0–15.0)
Immature Granulocytes: 1 %
Lymphocytes Relative: 26 %
Lymphs Abs: 2.3 10*3/uL (ref 0.7–4.0)
MCH: 29.6 pg (ref 26.0–34.0)
MCHC: 33.8 g/dL (ref 30.0–36.0)
MCV: 87.5 fL (ref 80.0–100.0)
Monocytes Absolute: 0.9 10*3/uL (ref 0.1–1.0)
Monocytes Relative: 10 %
Neutro Abs: 5.4 10*3/uL (ref 1.7–7.7)
Neutrophils Relative %: 62 %
Platelets: 245 10*3/uL (ref 150–400)
RBC: 4.16 MIL/uL (ref 3.87–5.11)
RDW: 12.2 % (ref 11.5–15.5)
WBC: 8.7 10*3/uL (ref 4.0–10.5)
nRBC: 0 % (ref 0.0–0.2)

## 2022-04-08 LAB — LIPASE, BLOOD: Lipase: 34 U/L (ref 11–51)

## 2022-04-08 MED ORDER — LIDOCAINE 5 % EX PTCH
1.0000 | MEDICATED_PATCH | Freq: Once | CUTANEOUS | Status: DC
  Administered 2022-04-08: 3 via TRANSDERMAL
  Filled 2022-04-08: qty 3

## 2022-04-08 MED ORDER — ACETAMINOPHEN 325 MG PO TABS: 650.0000 mg | ORAL_TABLET | Freq: Four times a day (QID) | ORAL | 0 refills | Status: DC | PRN

## 2022-04-08 MED ORDER — LIDOCAINE 5 % EX PTCH: 1.0000 | MEDICATED_PATCH | CUTANEOUS | 0 refills | Status: DC

## 2022-04-08 MED ORDER — ACETAMINOPHEN 500 MG PO TABS
1000.0000 mg | ORAL_TABLET | Freq: Once | ORAL | Status: AC
  Administered 2022-04-08: 1000 mg via ORAL
  Filled 2022-04-08: qty 2

## 2022-04-08 MED ORDER — OXYCODONE HCL 5 MG PO TABS
5.0000 mg | ORAL_TABLET | Freq: Once | ORAL | Status: AC
  Administered 2022-04-08: 5 mg via ORAL
  Filled 2022-04-08: qty 1

## 2022-04-08 NOTE — ED Triage Notes (Signed)
Pt is c/o pain to her right side x 2 weeks  Pt has been to her PCP twice for same  Pt states they did a urinalysis but it was clear, was given some pain pills but is not getting any relief with them so they told her to come to the ED

## 2022-04-08 NOTE — Discharge Instructions (Addendum)
You were seen in the emergency department for your flank pain.  Your work-up showed no signs of kidney stones, urinary tract infection or kidney infection.  Your CT did show that you have gallstones but no signs of gallbladder infection and based on your pain pattern, it is unclear if your pain is related to the gallstones or if this was an incidental finding.  It is likely that your pain is likely secondary to a muscle type of pain or could be related to the arthritis in your back.  You should continue the steroid taper as prescribed by your primary doctor, you can also take Tylenol up to every 6 hours daily, use lidocaine patches in addition to the muscle relaxant and tramadol prescribed by your primary doctor.  You should follow-up with your primary doctor in the next few days to have your symptoms rechecked.  You can follow-up with surgery as needed for gallbladder removal.  He should return to the emergency department if you are having significantly worsening pain, you have fevers, you have repetitive vomiting, or if you have any other new or concerning symptoms.

## 2022-04-08 NOTE — ED Provider Notes (Signed)
[facility] EMERGENCY DEPARTMENT Provider Note   CSN: [id number] Arrival date & time: 04/08/22  7564     History  Chief Complaint  Patient presents with   Flank Pain    Marcia Chan is a 51 y.o. female.  Patient is a 51 year old female with a past medical history of hypertension and hyperlipidemia presenting to the emergency department with back pain.  Patient states that 2 weeks ago she woke up in the morning and felt a mild pain on the right side of her mid back.  She states that throughout that day but the pain worsened and by the next day the pain was severe.  She states that she saw her primary doctor on Monday and they thought that it was a muscle strain and prescribed her Flexeril.  They state they also checked her urine and it was normal.  She states that her pain did not improve with the Flexeril and she was seen again by her doctor who prescribed her tramadol, steroid taper and methocarbamol.  She states that she took the tramadol and started the steroid taper yesterday but had no improvement so her primary doctor recommended that she come to the emergency department.  She denies any fevers or chills, nausea or vomiting.  She states she has been constipated.  She denies any dysuria, hematuria, saddle anesthesia, loss of bowel or bladder function, numbness or tingling.  She denies any cough or shortness of breath prior to the pain starting.  She denies any recent heavy lifting, trauma, or changes in activity.  She states that she always feels a dull ache and gets a sharp severe pain when turning side to side or when laying on her right side.  The history is provided by the patient.  Flank Pain       Home Medications Prior to Admission medications   Medication Sig Start Date End Date Taking? Authorizing Provider  acetaminophen (TYLENOL) 325 MG tablet Take 2 tablets (650 mg total) by mouth every 6 (six) hours as needed. 04/08/22  Yes Kingsley, [place] K, DO   lidocaine (LIDODERM) 5 % Place 1 patch onto the skin daily. Remove & Discard patch within 12 hours or as directed by MD 04/08/22  Yes Maylon Peppers, Norman Herrlich, DO  carvedilol (COREG) 6.25 MG tablet Take 1 tablet (6.25 mg total) by mouth 2 (two) times daily with a meal. 04/06/22   Midge Minium, MD  levonorgestrel (MIRENA) 20 MCG/24HR IUD 1 each by Intrauterine route once.    [provider]  meloxicam (MOBIC) 7.5 MG tablet Take 1-2 tablets (7.5-15 mg total) by mouth daily as needed for pain. 03/30/22   Wendie Agreste, MD  methocarbamol (ROBAXIN) 500 MG tablet Take 1 tablet (500 mg total) by mouth every 8 (eight) hours as needed for muscle spasms. 04/06/22   Midge Minium, MD  pantoprazole (PROTONIX) 40 MG tablet Take 1 tablet (40 mg total) by mouth 2 (two) times daily. TAKE 1 TABLET(40 MG) BY MOUTH TWICE DAILY Strength: 40 mg 10/26/21   Midge Minium, MD  pravastatin (PRAVACHOL) 40 MG tablet TAKE 1 TABLET(40 MG) BY MOUTH DAILY 04/06/22   Midge Minium, MD  predniSONE (DELTASONE) 10 MG tablet 3 tabs x3 days and then 2 tabs x3 days and then 1 tab x3 days.  Take w/ food. 04/06/22   Midge Minium, MD  traMADol (ULTRAM) 50 MG tablet Take 1 tablet (50 mg total) by mouth every 8 (eight) hours as needed  for up to 5 days. 04/06/22 04/11/22  Sheliah Hatch, MD  triamterene-hydrochlorothiazide (MAXZIDE-25) 37.5-25 MG tablet Take 1 tablet by mouth daily. 04/06/22   Sheliah Hatch, MD      Allergies    Bextra [valdecoxib]    Review of Systems   Review of Systems  Genitourinary:  Positive for flank pain.    Physical Exam Updated Vital Signs BP 122/78 (BP Location: Left Arm)   Pulse 76   Temp 98.8 F (37.1 C) (Oral)   Resp 17   Ht 5\' 7"  (1.702 m)   Wt 132.9 kg   SpO2 98%   BMI 45.89 kg/m  Physical Exam Vitals and nursing note reviewed.  Constitutional:      General: She is not in acute distress.    Appearance: Normal appearance. She is obese.  HENT:      Head: Normocephalic and atraumatic.     Nose: Nose normal.     Mouth/Throat:     Mouth: Mucous membranes are moist.     Pharynx: Oropharynx is clear.  Eyes:     Extraocular Movements: Extraocular movements intact.     Conjunctiva/sclera: Conjunctivae normal.  Neck:     Comments: No midline neck tenderness Cardiovascular:     Rate and Rhythm: Normal rate and regular rhythm.     Pulses: Normal pulses.     Heart sounds: Normal heart sounds.  Pulmonary:     Effort: Pulmonary effort is normal.     Breath sounds: Normal breath sounds.  Abdominal:     General: Abdomen is flat.     Palpations: Abdomen is soft.     Tenderness: There is no abdominal tenderness. There is right CVA tenderness. There is no left CVA tenderness.  Musculoskeletal:        General: Normal range of motion.     Cervical back: Normal range of motion and neck supple.     Right lower leg: No edema.     Left lower leg: No edema.     Comments: No midline back tenderness, tenderness to palpation of right mid back and flank, no overlying skin changes  Skin:    General: Skin is warm and dry.     Findings: No rash.  Neurological:     General: No focal deficit present.     Mental Status: She is alert and oriented to person, place, and time.  Psychiatric:        Mood and Affect: Mood normal.        Behavior: Behavior normal.     ED Results / Procedures / Treatments   Labs (all labs ordered are listed, but only abnormal results are displayed) Labs Reviewed  COMPREHENSIVE METABOLIC PANEL - Abnormal; Notable for the following components:      Result Value   Glucose, Bld 107 (*)    Calcium 8.8 (*)    All other components within normal limits  URINALYSIS, ROUTINE W REFLEX MICROSCOPIC - Abnormal; Notable for the following components:   APPearance HAZY (*)    Bilirubin Urine SMALL (*)    All other components within normal limits  CBC WITH DIFFERENTIAL/PLATELET  LIPASE, BLOOD  PREGNANCY, URINE     EKG None  Radiology CT Renal Stone Study  Result Date: 04/08/2022 CLINICAL DATA:  51 year old female with right side flank pain for 2 weeks, reportedly urinalysis was negative. EXAM: CT ABDOMEN AND PELVIS WITHOUT CONTRAST TECHNIQUE: Multidetector CT imaging of the abdomen and pelvis was performed following the standard protocol without  IV contrast. RADIATION DOSE REDUCTION: This exam was performed according to the departmental dose-optimization program which includes automated exposure control, adjustment of the mA and/or kV according to patient size and/or use of iterative reconstruction technique. COMPARISON:  Noncontrast CT Abdomen and Pelvis 11/27/2019 and earlier. FINDINGS: Lower chest: Negative. Hepatobiliary: Cholelithiasis is evident on series 2, image 29. No pericholecystic inflammation. Stable and negative noncontrast liver. Pancreas: Negative. Spleen: Negative. Adrenals/Urinary Tract: Stable and negative. No convincing urinary calculus. Stomach/Bowel: Redundant sigmoid colon. Descending and sigmoid diverticulosis with no active inflammation. Redundant transverse colon and right colon with low-density retained stool. Normal appendix on series 2, image 53. The cecum is on a lax mesentery. No large bowel inflammation. Decompressed terminal ileum. No dilated small bowel. Stomach and duodenum appear negative. No free air or free fluid identified. Vascular/Lymphatic: Normal caliber abdominal aorta. No calcified atherosclerosis or lymphadenopathy identified. Vascular patency is not evaluated in the absence of IV contrast. Reproductive: Chronic IUD appears stable along with the noncontrast appearance of the uterus and adnexa. Possible fibroid uterus. Other: No pelvic free fluid.  Chronic pelvic phleboliths. Musculoskeletal: Advanced lumbar spine degeneration. Bulky facet arthropathy at L4-L5 and L5-S1. Vacuum facet on the right. Bulky disc and endplate degeneration superimposed. 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 No acute osseous abnormality identified. IMPRESSION: 1. Cholelithiasis without CT evidence of acute cholecystitis. 2. 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 3. Normal appendix and no acute or inflammatory process identified in the non-contrast abdomen or pelvis. Electronically Signed   By: Odessa Fleming M.D.   On: 04/08/2022 08:12    Procedures Procedures    Medications Ordered in ED Medications  lidocaine (LIDODERM) 5 % 1-3 patch (3 patches Transdermal Patch Applied 04/08/22 0740)  acetaminophen (TYLENOL) tablet 1,000 mg (1,000 mg Oral Given 04/08/22 0740)  oxyCODONE (Oxy IR/ROXICODONE) immediate release tablet 5 mg (5 mg Oral Given 04/08/22 0740)    ED Course/ Medical Decision Making/ A&P Clinical Course as of 04/08/22 5643  Sat Apr 08, 2022  0819 CT stone search showed no evidence of nephrolithiasis.  It did show cholelithiasis without cholecystitis as well as lumbar degeneration. [VK]  0820 Labs show no evidence of biliary obstruction, no evidence of UTI or AKI. [VK]  3295 Patient reports the pain is not gone but it is improving.  Patient is stable for discharge home with primary care follow-up.  She was recommended to continue the steroid taper and start taking Tylenol up to every 6 hours, using the lidocaine patches as well as the muscle relaxant and tramadol as prescribed by her primary doctor.  She will be given surgery follow-up for her cholelithiasis.  She was given strict return precautions. [VK]    Clinical Course User Index [VK] Rexford Maus, DO                           Medical Decision Making This patient presents to the ED with chief complaint(s) of flank pain with pertinent past medical history of hypertension, hyperlipidemia which further complicates the presenting complaint. The complaint involves an extensive differential diagnosis and also  carries with it a high risk of complications and morbidity.    The differential diagnosis includes muscle strain, fracture less likely as no trauma, pneumonia or pleural effusion less likely as no recent cough or shortness of breath, possible nephrolithiasis who pain has been unchanged in 2 weeks, possible  Coley lithiasis, cholecystitis less likely as no fevers, nausea or vomiting, no evidence of rash making zoster unlikely  Additional history obtained: Additional history obtained from N/A Records reviewed Primary Care Documents  ED Course and Reassessment: Reviewed patient's primary care records and she had urine studies that were normal including a negative urine culture and was given treatment for MSK pain without significant relief.  Due to patient's prolonged symptoms she will have further work-up with urine, labs and CT to further evaluate for alternative causes of her back pain.  She will be given Tylenol, oxycodone and lidocaine patch due to her NSAID allergy.  Independent labs interpretation:  The following labs were independently interpreted: Within normal range  Independent visualization of imaging: - I independently visualized the following imaging with scope of interpretation limited to determining acute life threatening conditions related to emergency care: CT stone search, which revealed cholelithiasis without cholecystitis, no evidence of nephrolithiasis, degenerative changes of the lumbar spine  Consultation: - Consulted or discussed management/test interpretation w/ external professional: N/A  Consideration for admission or further workup: Patient is stable for discharge home with primary care follow-up and surgery as needed.  She was given strict return precautions Social Determinants of health: N/A    Amount and/or Complexity of Data Reviewed Labs: ordered. Radiology: ordered.  Risk OTC drugs. Prescription drug management.          Final Clinical  Impression(s) / ED Diagnoses Final diagnoses:  Right flank pain  Calculus of gallbladder without cholecystitis without obstruction    Rx / DC Orders ED Discharge Orders          Ordered    acetaminophen (TYLENOL) 325 MG tablet  Every 6 hours PRN        04/08/22 0831    lidocaine (LIDODERM) 5 %  Every 24 hours        04/08/22 0831              Rexford Maus, DO 04/08/22 [phone]

## 2022-04-10 ENCOUNTER — Other Ambulatory Visit: Payer: Self-pay

## 2022-04-10 DIAGNOSIS — I10 Essential (primary) hypertension: Secondary | ICD-10-CM

## 2022-04-10 MED ORDER — TRIAMTERENE-HCTZ 37.5-25 MG PO TABS: 1.0000 | ORAL_TABLET | Freq: Every day | ORAL | 1 refills | Status: DC

## 2022-04-10 MED ORDER — CARVEDILOL 6.25 MG PO TABS: 6.2500 mg | ORAL_TABLET | Freq: Two times a day (BID) | ORAL | 1 refills | Status: DC

## 2022-04-13 ENCOUNTER — Ambulatory Visit (INDEPENDENT_AMBULATORY_CARE_PROVIDER_SITE_OTHER): Payer: BC Managed Care – PPO | Admitting: Family Medicine

## 2022-04-13 ENCOUNTER — Ambulatory Visit: Payer: BC Managed Care – PPO

## 2022-04-13 ENCOUNTER — Encounter: Payer: Self-pay | Admitting: Family Medicine

## 2022-04-13 VITALS — BP 124/82 | HR 86 | Temp 99.0°F | Resp 17 | Ht 67.0 in | Wt 293.5 lb

## 2022-04-13 DIAGNOSIS — M545 Low back pain, unspecified: Secondary | ICD-10-CM | POA: Diagnosis not present

## 2022-04-13 DIAGNOSIS — Z23 Encounter for immunization: Secondary | ICD-10-CM | POA: Diagnosis not present

## 2022-04-13 DIAGNOSIS — K8020 Calculus of gallbladder without cholecystitis without obstruction: Secondary | ICD-10-CM | POA: Diagnosis not present

## 2022-04-13 MED ORDER — TRAMADOL HCL 50 MG PO TABS: 50.0000 mg | ORAL_TABLET | Freq: Three times a day (TID) | ORAL | 0 refills | Status: AC | PRN

## 2022-04-13 NOTE — Progress Notes (Signed)
   Subjective:    Patient ID: Marcia Chan, female    DOB: 12/06/1970, 51 y.o.   MRN: [id number]  HPI ER f/u- pt went to ER on 10/7 b/c 'i couldn't even move'.  Had complete workup- no evidence of kidney stones.  + gallstones w/o inflammation or infection.  + degenerative changes of lumbar spine.  Continues to have twisting, gripping pains at night- particularly when she rolls over.  Does ok during the day but sxs return at night.  Mom has been in the hospital recently and she has been sleeping in hospital chairs and other uncomfortable positions.   Review of Systems For ROS see HPI     Objective:   Physical Exam Vitals reviewed.  Constitutional:      General: She is not in acute distress.    Appearance: Normal appearance. She is obese. She is not ill-appearing.  HENT:     Head: Normocephalic and atraumatic.  Abdominal:     General: There is no distension.     Palpations: Abdomen is soft.     Tenderness: There is no abdominal tenderness. There is no guarding or rebound.  Musculoskeletal:        General: Tenderness (TTP over R lower paraspinal muscles) present. No swelling or deformity.  Skin:    General: Skin is warm and dry.     Findings: No rash.  Neurological:     General: No focal deficit present.     Mental Status: She is alert and oriented to person, place, and time.  Psychiatric:        Mood and Affect: Mood normal.        Behavior: Behavior normal.        Thought Content: Thought content normal.           Assessment & Plan:  R sided low back pain- improving.  Pt feels that things are slowly starting to turn the corner.  She knows she hasn't been able to take care of herself the way she needed to w/ her back in spasms, but now that mom is home from the hospital she is hoping to take time to recover.  Continue tramadol as needed for pain.  Methocarbamol prn.  Pt expressed understanding and is in agreement w/ plan.   Gall stones- new.  Seen on imaging.  Pt is  asymptomatic but would like to discuss options going forward.  Referral placed for surgical consultation

## 2022-04-13 NOTE — Patient Instructions (Signed)
Follow up as needed or as scheduled We'll call you to schedule your surgery consultation Continue the Tramadol as needed for pain Jack!!!! Call with any questions or concerns Hang in there!!!

## 2022-04-14 ENCOUNTER — Telehealth: Payer: Self-pay

## 2022-04-14 NOTE — Telephone Encounter (Signed)
Left pt  a VM stating her PA for her Rx for Tramadol has been approved and that she can have the pharmacy run the Rx again

## 2022-04-14 NOTE — Telephone Encounter (Signed)
Pharmacy sent a PA request for pt's Rx Tramadol I have submitted the PA thru Cover My Meds . Waiting on response

## 2022-04-17 ENCOUNTER — Telehealth: Payer: Self-pay | Admitting: Family Medicine

## 2022-04-17 DIAGNOSIS — M545 Low back pain, unspecified: Secondary | ICD-10-CM

## 2022-04-17 NOTE — Telephone Encounter (Signed)
Pt states she is still in severe pain in lower right side . She is doing everything we told her to do . She is asking what else can she do states the Tramadol is not helping now

## 2022-04-17 NOTE — Telephone Encounter (Signed)
Form completed and returned to [facility] 

## 2022-04-17 NOTE — Telephone Encounter (Signed)
Pt called; states that pain level has increased; states that it's back "up to a 10".  States that pain medication is not helping; would like to know the next steps in helping to manage her pain.   [phone]

## 2022-04-17 NOTE — Telephone Encounter (Signed)
Forms faxed back to the [facility]

## 2022-04-17 NOTE — Telephone Encounter (Signed)
PA forms received for Tramadol. Placed in Dr. Virgil Benedict bin.

## 2022-04-17 NOTE — Telephone Encounter (Signed)
Placed forms in Dr Birdie Riddle to be signed folder once signed I will contact pt and fax them

## 2022-04-17 NOTE — Telephone Encounter (Signed)
Left pt a vm to return my call.

## 2022-04-18 NOTE — Telephone Encounter (Signed)
Placed an urgent referral to Ortho.  Her CT scan showed degenerative disc disease on the R and I'm afraid this is what is causing all of her pain.  Hopefully they will be able to help get it under control

## 2022-04-18 NOTE — Telephone Encounter (Signed)
Informed pt of the message stating we placed an urgent referral to ortho

## 2022-06-15 ENCOUNTER — Ambulatory Visit: Payer: BC Managed Care – PPO | Admitting: Family Medicine

## 2022-06-17 ENCOUNTER — Emergency Department (HOSPITAL_BASED_OUTPATIENT_CLINIC_OR_DEPARTMENT_OTHER)
Admission: EM | Admit: 2022-06-17 | Discharge: 2022-06-17 | Disposition: A | Discharge status: Home / Self Care | Payer: BC Managed Care – PPO | Attending: Emergency Medicine | Admitting: Emergency Medicine

## 2022-06-17 ENCOUNTER — Encounter (HOSPITAL_BASED_OUTPATIENT_CLINIC_OR_DEPARTMENT_OTHER): Payer: Self-pay | Admitting: Emergency Medicine

## 2022-06-17 ENCOUNTER — Other Ambulatory Visit: Payer: Self-pay

## 2022-06-17 DIAGNOSIS — J020 Streptococcal pharyngitis: Secondary | ICD-10-CM | POA: Diagnosis not present

## 2022-06-17 DIAGNOSIS — J029 Acute pharyngitis, unspecified: Secondary | ICD-10-CM | POA: Diagnosis present

## 2022-06-17 DIAGNOSIS — I10 Essential (primary) hypertension: Secondary | ICD-10-CM | POA: Insufficient documentation

## 2022-06-17 LAB — GROUP A STREP BY PCR: Group A Strep by PCR: DETECTED — AB

## 2022-06-17 MED ORDER — AMOXICILLIN 500 MG PO CAPS: 500.0000 mg | ORAL_CAPSULE | Freq: Two times a day (BID) | ORAL | 0 refills | Status: AC

## 2022-06-17 NOTE — ED Notes (Signed)
Pt discharged to home. Discharge instructions have been discussed with patient and/or family members. Pt verbally acknowledges understanding d/c instructions, and endorses comprehension to checkout at registration before leaving.  °

## 2022-06-17 NOTE — Discharge Instructions (Signed)
You are being treated today for strep throat.  You may continue over-the-counter Tylenol and ibuprofen unless you have been told not to take 1 of these medicines by your doctor.  Please complete your antibiotic course and return with any new or suddenly worsening symptoms.

## 2022-06-17 NOTE — ED Notes (Signed)
ED Provider at bedside. 

## 2022-06-17 NOTE — ED Provider Notes (Signed)
Emergency Department Provider Note   I have reviewed the triage vital signs and the nursing notes.   HISTORY  Chief Complaint Sore Throat   HPI Marcia Chan is a 51 y.o. female with past history of hypertension, hyperlipidemia presents emergency department with postnasal drip and sore throat.  Symptoms been going on for the past 10 days.  She has associated cough and mild shortness of breath.  No chest pain.  No fevers.  She has tried over-the-counter Mucinex, NyQuil, Motrin/Tylenol with minimal relief in symptoms.    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   Seasonal allergic rhinitis 10/08/2011   Severe obesity (BMI >= 40) (HCC) 02/10/2011   Vertigo    Vitamin D deficiency 12/14/2016    Review of Systems  Constitutional: No fever/chills ENT: Positive sore throat. Cardiovascular: Denies chest pain. Respiratory: Positive shortness of breath and cough.  Gastrointestinal: No abdominal pain.   Musculoskeletal: Negative for back pain. Skin: Negative for rash.  ____________________________________________   PHYSICAL EXAM:  VITAL SIGNS: ED Triage Vitals  Enc Vitals Group     BP 06/17/22 0719 (!) 144/92     Pulse Rate 06/17/22 0719 73     Resp 06/17/22 0719 20     Temp 06/17/22 0719 98.3 F (36.8 C)     Temp Source 06/17/22 0719 Oral     SpO2 06/17/22 0719 99  %     Weight 06/17/22 0719 294 lb 1.5 oz (133.4 kg)     Height 06/17/22 0719 5\' 7"  (1.702 m)   Constitutional: Alert and oriented. Well appearing and in no acute distress. Eyes: Conjunctivae are normal.  Head: Atraumatic. Nose: Mild congestion/rhinnorhea. Mouth/Throat: Mucous membranes are moist.  Oropharynx is erythematous. No exudate. No PTA.  Neck: No stridor. Cardiovascular: Normal rate, regular rhythm. Good peripheral circulation. Grossly normal heart sounds.   Respiratory: Normal respiratory effort.  No retractions. Lungs CTAB. Gastrointestinal: No distention.  Musculoskeletal: No gross deformities of extremities. Neurologic:  Normal speech and language.  Skin:  Skin is warm, dry and intact. No rash noted.  ____________________________________________   LABS (all labs ordered are listed, but only abnormal results are displayed)  Labs Reviewed  GROUP A STREP BY PCR - Abnormal; Notable for the following components:      Result Value   Group A Strep by PCR DETECTED (*)    All other components within normal limits    ____________________________________________   PROCEDURES  Procedure(s) performed:   Procedures  None ____________________________________________   INITIAL IMPRESSION / ASSESSMENT AND PLAN / ED COURSE  Pertinent labs & imaging results that were available during my care of the patient were reviewed by me and considered in my medical decision making (see chart for details).   This patient is Presenting for Evaluation of sore throat, which does require a  range of treatment options, and is a complaint that involves a moderate risk of morbidity and mortality.  The Differential Diagnoses include viral pharyngitis, strep pharyngitis, allergic rhinitis, bronchitis, etc.   Clinical Laboratory Tests Ordered, included  Strep PCR positive.   Medical Decision Making: Summary:  Patient presents emergency department for evaluation of postnasal drip and sore  throat.  Lungs are clear.  Oxygen saturation normal.  Considered chest imaging but defer with normal exam and overall well appearance.  Mild erythema to the posterior pharynx.  Plan for strep PCR and reassess.   Reevaluation with update and discussion with patient.  Discussed strep positive result.  No antibiotic allergies.  Discussed continued supportive care at home and will add amoxicillin twice daily for the next 7 days.  Patient's presentation is most consistent with acute, uncomplicated illness.   Disposition: discharge  ____________________________________________  FINAL CLINICAL IMPRESSION(S) / ED DIAGNOSES  Final diagnoses:  Strep throat     NEW OUTPATIENT MEDICATIONS STARTED DURING THIS VISIT:  New Prescriptions   AMOXICILLIN (AMOXIL) 500 MG CAPSULE    Take 1 capsule (500 mg total) by mouth 2 (two) times daily for 7 days.    Note:  This document was prepared using Dragon voice recognition software and may include unintentional dictation errors.  Alona Bene, MD, [facility]    Maridee Slape, Arlyss Repress, MD 06/17/22 [phone]

## 2022-06-17 NOTE — ED Triage Notes (Signed)
Pt arrives pov, steady gait, c/o  sore throat with post nasal drip x 1.5 weeks, Denies fever

## 2022-07-07 HISTORY — PX: CHOLECYSTECTOMY: SHX55

## 2022-07-14 ENCOUNTER — Telehealth: Payer: Self-pay | Admitting: Family Medicine

## 2022-07-14 ENCOUNTER — Encounter: Payer: Self-pay | Admitting: Family Medicine

## 2022-07-14 ENCOUNTER — Ambulatory Visit: Payer: BC Managed Care – PPO | Admitting: Family Medicine

## 2022-07-14 VITALS — BP 132/82 | HR 77 | Temp 98.4°F | Resp 18 | Ht 67.0 in | Wt 289.5 lb

## 2022-07-14 DIAGNOSIS — K1379 Other lesions of oral mucosa: Secondary | ICD-10-CM

## 2022-07-14 DIAGNOSIS — J309 Allergic rhinitis, unspecified: Secondary | ICD-10-CM

## 2022-07-14 DIAGNOSIS — J029 Acute pharyngitis, unspecified: Secondary | ICD-10-CM

## 2022-07-14 DIAGNOSIS — S00512A Abrasion of oral cavity, initial encounter: Secondary | ICD-10-CM

## 2022-07-14 DIAGNOSIS — K143 Hypertrophy of tongue papillae: Secondary | ICD-10-CM

## 2022-07-14 LAB — POCT RAPID STREP A (OFFICE): Rapid Strep A Screen: NEGATIVE

## 2022-07-14 MED ORDER — FLUCONAZOLE 150 MG PO TABS: 150.0000 mg | ORAL_TABLET | Freq: Every day | ORAL | 0 refills | Status: DC

## 2022-07-14 MED ORDER — MAGIC MOUTHWASH W/LIDOCAINE: 5.0000 mL | Freq: Four times a day (QID) | ORAL | 0 refills | Status: DC | PRN

## 2022-07-14 NOTE — Telephone Encounter (Signed)
error 

## 2022-07-14 NOTE — Progress Notes (Unsigned)
Subjective:  Patient ID: Marcia Chan, female    DOB: 03-17-1971  Age: 52 y.o. MRN: [id number]  CC:  Chief Complaint  Patient presents with   Headache    Pt states her mouth is raw , having some facial pain and now having headaches  Had surgery on Friday to remove gallbladder and this started on Sunday surgery told pt to see PCP     HPI Marcia Chan presents for   Headache: Noted 4 nights ago.  Mouth feels raw.  Some face pain, some headaches.  Surgery 1 week ago for cholecystectomy. Some sore throat day after surgery, clearing throat. Roof of mouth sore 2 days later. Upper mouth red. No blisters, white spots initially. Yesterday noticed white area on top of mouth.  Sinus pressure and HA past week. Left sinus congestion. Usually takes claritin for allergies, off this week.  No fever. No bodyache.  Hurts to eat/drink, but drinking fluids -sipping water/apple juice.  Tx: tylenol from surgery. Warm salt water gargle.  Strep infection for few weeks prior to surgery (12/16), stopped after 4 days due to side effects.  No known sick contacts at home.  History Patient Active Problem List   Diagnosis Date Noted   Unilateral primary osteoarthritis, right hip 12/27/2020   Pain in joint of right hip 07/15/2020   Palpitations 09/19/2019   Morbid obesity ([place]) 09/19/2019   Cardiac murmur 09/19/2019   Anxiety 05/01/2019   Primary osteoarthritis of both knees 08/30/2018   Vitamin D deficiency 12/14/2016   Plantar fasciitis of left foot 01/06/2014   Numbness and tingling of left leg 06/09/2013   Lumbar back pain 12/23/2012   Lateral epicondylitis 12/23/2012   GERD (gastroesophageal reflux disease) 05/03/2012   Seasonal allergic rhinitis 10/08/2011   General medical examination 10/06/2011   Hair loss 02/21/2011   HTN (hypertension) 02/10/2011   Hyperlipidemia 02/10/2011   Nail fungus 02/10/2011   Past Medical History:  Diagnosis Date   Anxiety 05/01/2019   Back pain     Bilateral swelling of feet    Constipation    Cough 06/21/2011   General medical examination 10/06/2011   GERD (gastroesophageal reflux disease)    Hair loss 02/21/2011   HTN (hypertension) 02/10/2011   Hyperlipidemia    Hypertension    Joint pain    Lactose intolerance    Lateral epicondylitis 12/23/2012   Lumbar back pain 12/23/2012   Nail fungus 02/10/2011   Numbness and tingling of left leg 06/09/2013   Plantar fasciitis of left foot 01/06/2014   Primary osteoarthritis of both knees 08/30/2018   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 Authorizing Provider  acetaminophen (TYLENOL) 325 MG tablet Take 2 tablets (650 mg total) by mouth every 6 (six) hours as needed. Patient not taking: Reported on 04/13/2022 04/08/22   Leanord Asal K, DO  carvedilol (COREG) 6.25 MG tablet Take 1 tablet (6.25 mg total) by mouth 2 (two) times daily with a meal. 04/10/22   Tabori,  [name], MD  levonorgestrel (MIRENA) 20 MCG/24HR IUD 1 each by Intrauterine route once.    [provider]  lidocaine (LIDODERM) 5 % Place 1 patch onto the skin daily. Remove & Discard patch within 12 hours or as directed by MD Patient not taking: Reported on 04/13/2022 04/08/22   Elayne Snare K, DO  meloxicam (MOBIC) 7.5 MG tablet Take 1-2 tablets (7.5-15 mg total) by mouth daily as needed for pain. 03/30/22   Shade Flood, MD  methocarbamol (ROBAXIN) 500 MG tablet Take 1 tablet (500 mg total) by mouth every 8 (eight) hours as needed for muscle spasms.  04/06/22   Sheliah Hatch, MD  pantoprazole (PROTONIX) 40 MG tablet Take 1 tablet (40 mg total) by mouth 2 (two) times daily. TAKE 1 TABLET(40 MG) BY MOUTH TWICE DAILY Strength: 40 mg 10/26/21   Sheliah Hatch, MD  pravastatin (PRAVACHOL) 40 MG tablet TAKE 1 TABLET(40 MG) BY MOUTH DAILY 04/06/22   Sheliah Hatch, MD  predniSONE (DELTASONE) 10 MG tablet 3 tabs x3 days and then 2 tabs x3 days and then 1 tab x3 days.  Take w/ food. 04/06/22   Sheliah Hatch, MD  triamterene-hydrochlorothiazide (MAXZIDE-25) 37.5-25 MG tablet Take 1 tablet by mouth daily. 04/10/22   Sheliah Hatch, MD   Social History   Socioeconomic History   Marital status: Married    Spouse name: Not on file   Number of children: Not on file   Years of education: Not on file   Highest education level: Not on file  Occupational History   Occupation: Teacher  Tobacco Use   Smoking status: Never   Smokeless tobacco: Never  Vaping Use   Vaping Use: Never used  Substance and Sexual Activity   Alcohol use: No   Drug use: No   Sexual activity: Not on file  Other Topics Concern   Not on file  Social History Narrative   G3 P1 A2   Social Determinants of Health   Financial Resource Strain: Not on file  Food Insecurity: Not on file  Transportation Needs: Not on file  Physical Activity: Not on file  Stress: Not on file  Social Connections: Not on file  Intimate Partner Violence: Not on file   Review of Systems Per HPI.   Objective:   Vitals:   07/14/22 0818  BP: 132/82  Pulse: 77  Resp: 18  Temp: 98.4 F (36.9 C)  TempSrc: Oral  SpO2: 98%  Weight: 289 lb 8 oz (131.3 kg)  Height: 5\' 7"  (1.702 m)     Physical Exam Vitals reviewed.  Constitutional:      General: She is not in acute distress.    Appearance: She is well-developed.  HENT:     Head: Normocephalic and atraumatic.     Right Ear: Hearing, tympanic membrane, ear canal and external ear normal.     Left Ear: Hearing,  tympanic membrane, ear canal and external ear normal.     Nose: Nose normal.     Mouth/Throat:     Pharynx: No posterior oropharyngeal erythema.     Comments: White coating on top of tongue.  Abraded/ulcerative area upper mouth - see photo. Mild erythema of posterior OP. No exudate.  Eyes:     Conjunctiva/sclera: Conjunctivae normal.     Pupils: Pupils are equal, round, and reactive to light.  Cardiovascular:     Rate and Rhythm: Normal rate and regular rhythm.     Heart sounds: Normal heart  sounds. No murmur heard. Pulmonary:     Effort: Pulmonary effort is normal. No respiratory distress.     Breath sounds: Normal breath sounds. No wheezing or rhonchi.  Lymphadenopathy:     Cervical: No cervical adenopathy.  Skin:    General: Skin is warm and dry.     Findings: No rash.  Neurological:     Mental Status: She is alert and oriented to person, place, and time.  Psychiatric:        Mood and Affect: Mood normal.        Behavior: Behavior normal.      Results for orders placed or performed in visit on 07/14/22  POCT rapid strep A  Result Value Ref Range   Rapid Strep A Screen Negative Negative      Assessment & Plan:  Marcia Chan is a 52 y.o. female . Mouth pain - Plan: magic mouthwash w/lidocaine SOLN, fluconazole (DIFLUCAN) 150 MG tablet, POCT rapid strep A Tongue coating - Plan: fluconazole (DIFLUCAN) 150 MG tablet Abrasion of mouth region - Plan: magic mouthwash w/lidocaine SOLN Allergic rhinitis, unspecified seasonality, unspecified trigger Sore throat - Plan: POCT rapid strep A  Appears to have small abrasion over the torus palatinus which may be part of the discomfort, as well as some discomfort from possible thrush with tongue coating.  No white patches seen on buccal mucosa or posterior oropharynx.  He also have some irritation of the posterior oropharynx from prior intubation.  No apparent uvula swelling.  Clearing secretions normally, no stridor.  Strep  testing negative, unlikely strep infection.  -Continue salt water gargles, symptomatic care, Diflucan for possible thrush as well as Magic mouthwash with nystatin.  Urgent care precautions over the weekend if any worsening symptoms.  RTC precautions.   Meds ordered this encounter  Medications   magic mouthwash w/lidocaine SOLN    Sig: Take 5 mLs by mouth 4 (four) times daily as needed for mouth pain.    Dispense:  120 mL    Refill:  0    Ok to substitute ingredients per pharmacy usual "magic mouthwash" prep.add nystatin suspension. Call with questions.   fluconazole (DIFLUCAN) 150 MG tablet    Sig: Take 1 tablet (150 mg total) by mouth daily.    Dispense:  3 tablet    Refill:  0   Patient Instructions  See information sore throat care below.  I suspect the irritation in the upper throat/mouth is from prior intubation.  The area should heal with a little more time.  Try the Magic mouthwash, okay to continue salt water gargles.  Magic mouthwash should have antifungal medicine as well, but take Diflucan 1 pill for the next 3 days to cover for possible thrush.  If strep testing is positive I will let you know.  If any worsening symptoms over the weekend be seen in urgent care.  Hope you feel better soon.    Sore Throat A sore throat is pain, burning, irritation, or scratchiness in the throat. When you have a sore throat, you may feel pain or tenderness in your throat when you swallow or talk. Many things can cause a sore throat, including: An infection. Seasonal allergies. Dryness in the air. Irritants, such as smoke or pollution. Radiation treatment for cancer. Gastroesophageal reflux disease (GERD). A tumor. A sore throat is often the first sign of another sickness. It may happen with other symptoms, such as coughing, sneezing, fever, and swollen neck glands. Most sore throats go away without  medical treatment. Follow these instructions at home:     Medicines Take over-the-counter  and prescription medicines only as told by your health care provider. Children often get sore throats. Do not give your child aspirin because of the association with Reye's syndrome. Use throat sprays to soothe your throat as told by your health care provider. Managing pain To help with pain, try: Sipping warm liquids, such as broth, herbal tea, or warm water. Eating or drinking cold or frozen liquids, such as frozen ice pops. Gargling with a mixture of salt and water 3-4 times a day or as needed. To make salt water, completely dissolve -1 tsp (3-6 g) of salt in 1 cup (237 mL) of warm water. Sucking on hard candy or throat lozenges. Putting a cool-mist humidifier in your bedroom at night to moisten the air. Sitting in the bathroom with the door closed for 5-10 minutes while you run hot water in the shower. General instructions Do not use any products that contain nicotine or tobacco. These products include cigarettes, chewing tobacco, and vaping devices, such as e-cigarettes. If you need help quitting, ask your health care provider. Rest as needed. Drink enough fluid to keep your urine pale yellow. Wash your hands often with soap and water for at least 20 seconds. If soap and water are not available, use hand sanitizer. Contact a health care provider if: You have a fever for more than 2-3 days. You have symptoms that last for more than 2-3 days. Your throat does not get better within 7 days. You have a fever and your symptoms suddenly get worse. Get help right away if: You have difficulty breathing. You cannot swallow fluids, soft foods, or your saliva. You have increased swelling in your throat or neck. You have persistent nausea and vomiting. These symptoms may represent a serious problem that is an emergency. Do not wait to see if the symptoms will go away. Get medical help right away. Call your local emergency services (911 in the [place] Do not drive yourself to the  hospital. Summary A sore throat is pain, burning, irritation, or scratchiness in the throat. Many things can cause a sore throat. Take over-the-counter medicines only as told by your health care provider. Rest as needed. Drink enough fluid to keep your urine pale yellow. Contact a health care provider if your throat does not get better within 7 days. This information is not intended to replace advice given to you by your health care provider. Make sure you discuss any questions you have with your health care provider. Document Revised: 09/15/2020 Document Reviewed: 09/15/2020 Elsevier Patient Education  2023 Elsevier Inc.       Signed,   Meredith Staggers, MD [facility], [facility] 07/14/22 8:36 AM

## 2022-07-14 NOTE — Patient Instructions (Signed)
See information sore throat care below.  I suspect the irritation in the upper throat/mouth is from prior intubation.  The area should heal with a little more time.  Try the Magic mouthwash, okay to continue salt water gargles.  Magic mouthwash should have antifungal medicine as well, but take Diflucan 1 pill for the next 3 days to cover for possible thrush.  If strep testing is positive I will let you know.  If any worsening symptoms over the weekend be seen in urgent care.  Hope you feel better soon.    Sore Throat A sore throat is pain, burning, irritation, or scratchiness in the throat. When you have a sore throat, you may feel pain or tenderness in your throat when you swallow or talk. Many things can cause a sore throat, including: An infection. Seasonal allergies. Dryness in the air. Irritants, such as smoke or pollution. Radiation treatment for cancer. Gastroesophageal reflux disease (GERD). A tumor. A sore throat is often the first sign of another sickness. It may happen with other symptoms, such as coughing, sneezing, fever, and swollen neck glands. Most sore throats go away without medical treatment. Follow these instructions at home:     Medicines Take over-the-counter and prescription medicines only as told by your health care provider. Children often get sore throats. Do not give your child aspirin because of the association with Reye's syndrome. Use throat sprays to soothe your throat as told by your health care provider. Managing pain To help with pain, try: Sipping warm liquids, such as broth, herbal tea, or warm water. Eating or drinking cold or frozen liquids, such as frozen ice pops. Gargling with a mixture of salt and water 3-4 times a day or as needed. To make salt water, completely dissolve -1 tsp (3-6 g) of salt in 1 cup (237 mL) of warm water. Sucking on hard candy or throat lozenges. Putting a cool-mist humidifier in your bedroom at night to moisten the  air. Sitting in the bathroom with the door closed for 5-10 minutes while you run hot water in the shower. General instructions Do not use any products that contain nicotine or tobacco. These products include cigarettes, chewing tobacco, and vaping devices, such as e-cigarettes. If you need help quitting, ask your health care provider. Rest as needed. Drink enough fluid to keep your urine pale yellow. Wash your hands often with soap and water for at least 20 seconds. If soap and water are not available, use hand sanitizer. Contact a health care provider if: You have a fever for more than 2-3 days. You have symptoms that last for more than 2-3 days. Your throat does not get better within 7 days. You have a fever and your symptoms suddenly get worse. Get help right away if: You have difficulty breathing. You cannot swallow fluids, soft foods, or your saliva. You have increased swelling in your throat or neck. You have persistent nausea and vomiting. These symptoms may represent a serious problem that is an emergency. Do not wait to see if the symptoms will go away. Get medical help right away. Call your local emergency services (911 in the [place] Do not drive yourself to the hospital. Summary A sore throat is pain, burning, irritation, or scratchiness in the throat. Many things can cause a sore throat. Take over-the-counter medicines only as told by your health care provider. Rest as needed. Drink enough fluid to keep your urine pale yellow. Contact a health care provider if your throat does not get  better within 7 days. This information is not intended to replace advice given to you by your health care provider. Make sure you discuss any questions you have with your health care provider. Document Revised: 09/15/2020 Document Reviewed: 09/15/2020 Elsevier Patient Education  [place].

## 2022-07-28 ENCOUNTER — Ambulatory Visit: Payer: BC Managed Care – PPO | Admitting: Family Medicine

## 2022-08-09 ENCOUNTER — Telehealth: Payer: Self-pay

## 2022-08-09 NOTE — Telephone Encounter (Signed)
Received notification that the Rx [facility] was denied for the pt

## 2022-08-09 NOTE — Telephone Encounter (Signed)
Please let pt know it was denied despite our best efforts

## 2022-08-09 NOTE — Telephone Encounter (Signed)
Informed pt of denial

## 2022-08-11 ENCOUNTER — Encounter: Payer: Self-pay | Admitting: Family Medicine

## 2022-08-11 ENCOUNTER — Ambulatory Visit: Payer: BC Managed Care – PPO | Admitting: Family Medicine

## 2022-08-11 VITALS — BP 120/82 | HR 72 | Temp 98.0°F | Ht 67.0 in | Wt 287.2 lb

## 2022-08-11 DIAGNOSIS — L299 Pruritus, unspecified: Secondary | ICD-10-CM | POA: Diagnosis not present

## 2022-08-11 DIAGNOSIS — Z9049 Acquired absence of other specified parts of digestive tract: Secondary | ICD-10-CM

## 2022-08-11 MED ORDER — CLOTRIMAZOLE-BETAMETHASONE 1-0.05 % EX CREA: 1.0000 | TOPICAL_CREAM | Freq: Every day | CUTANEOUS | 1 refills | Status: DC

## 2022-08-11 NOTE — Patient Instructions (Signed)
Follow up as needed or as scheduled START the Lotrisone cream twice daily for the foot itching Try and keep feet clean and dry Call with any questions or concerns Stay Safe!  Stay Healthy! Have a great weekend!!!

## 2022-08-11 NOTE — Progress Notes (Signed)
   Subjective:    Patient ID: Marcia Chan, female    DOB: 25-Nov-1970, 52 y.o.   MRN: [id number]  HPI Foot itching- L.  Pt has seen a podiatrist previously and had toenail removed.  Pt reports itching all around edge of foot and along base of toes.  Denies itching between toes.  Area is dry, peeling, burning.  S/p cholecystectomy- pt had surgery 07/07/22.  Pt reports pain is manageable.  Pt is trying to determine 'what I can and cannot eat'.     Review of Systems For ROS see HPI     Objective:   Physical Exam Vitals reviewed.  Constitutional:      General: She is not in acute distress.    Appearance: She is obese. She is not ill-appearing.  HENT:     Head: Normocephalic and atraumatic.  Eyes:     Extraocular Movements: Extraocular movements intact.     Conjunctiva/sclera: Conjunctivae normal.  Cardiovascular:     Rate and Rhythm: Normal rate and regular rhythm.  Pulmonary:     Effort: Pulmonary effort is normal. No respiratory distress.  Abdominal:     General: There is no distension.     Palpations: Abdomen is soft.     Tenderness: There is no abdominal tenderness. There is no guarding or rebound.  Musculoskeletal:     Right lower leg: No edema.     Left lower leg: No edema.  Skin:    General: Skin is warm and dry.     Comments: Dry, peeling skin along base of L toes, along lateral edge of foot and along medial edge  Neurological:     General: No focal deficit present.     Mental Status: She is alert and oriented to person, place, and time.  Psychiatric:        Mood and Affect: Mood normal.        Behavior: Behavior normal.        Thought Content: Thought content normal.           Assessment & Plan:   Itching- new.  Suspect that she has some form of foot fungus given the peeling and itching.  Start Lotrisone cream.  Pt expressed understanding and is in agreement w/ plan.

## 2022-08-11 NOTE — Assessment & Plan Note (Signed)
Well healed.  Still having difficulty knowing what to eat and how it will impact her bowels.  Discussed that this will improve w/ time.

## 2022-08-16 ENCOUNTER — Telehealth: Payer: Self-pay

## 2022-08-16 NOTE — Telephone Encounter (Signed)
I have submitted a PA thu Cover my meds for Rx [facility] for pt . Waiting a response

## 2022-08-17 NOTE — Telephone Encounter (Signed)
I have texted Bill for [facility] and waiting to see what he says

## 2022-08-17 NOTE — Telephone Encounter (Signed)
Is there a way to reach out to the rep and ask him what qualifies as documentation of a 'comprehensive weight management program'?  We are getting a lot of denials for this reason but I'm not even sure what they are looking for, let alone how to provide proof of such a program.  Any info would be appreciated

## 2022-08-17 NOTE — Telephone Encounter (Signed)
Pharmacy Patient Advocate Encounter  Received notification from [facility] that the request for prior authorization for [facility] has been denied due to not being in a comprehensive weight management program for at least 6 months before starting this drug.    Please be advised we currently do not have a Pharmacist to review denials, therefore you will need to process appeals accordingly as needed. Thanks for your support at this time.   Denial letter attached to chart

## 2022-08-17 NOTE — Telephone Encounter (Signed)
How would you like to proceed, please advise.

## 2022-08-17 NOTE — Telephone Encounter (Signed)
Going to speak with Danae Chen ,NP for the [facility] she works along side Bill on Tuesday 08/22/22 in regards to what we are suppose to send when insurance companies ask for weight loss program documentation and so forth

## 2022-08-28 ENCOUNTER — Other Ambulatory Visit: Payer: Self-pay | Admitting: Family Medicine

## 2022-09-06 ENCOUNTER — Other Ambulatory Visit (HOSPITAL_COMMUNITY): Payer: Self-pay

## 2022-09-28 ENCOUNTER — Ambulatory Visit: Payer: BC Managed Care – PPO | Admitting: Family

## 2022-10-17 LAB — HM MAMMOGRAPHY

## 2022-10-21 ENCOUNTER — Other Ambulatory Visit: Payer: Self-pay | Admitting: Family Medicine

## 2022-10-23 ENCOUNTER — Other Ambulatory Visit: Payer: Self-pay

## 2022-10-23 DIAGNOSIS — I10 Essential (primary) hypertension: Secondary | ICD-10-CM

## 2022-10-23 MED ORDER — TRIAMTERENE-HCTZ 37.5-25 MG PO TABS: 1.0000 | ORAL_TABLET | Freq: Every day | ORAL | 1 refills | Status: DC

## 2022-10-26 ENCOUNTER — Other Ambulatory Visit: Payer: Self-pay | Admitting: Family Medicine

## 2022-10-26 DIAGNOSIS — I10 Essential (primary) hypertension: Secondary | ICD-10-CM

## 2022-10-30 ENCOUNTER — Telehealth (INDEPENDENT_AMBULATORY_CARE_PROVIDER_SITE_OTHER): Payer: BC Managed Care – PPO | Admitting: Family Medicine

## 2022-10-30 ENCOUNTER — Encounter: Payer: Self-pay | Admitting: Family Medicine

## 2022-10-30 VITALS — Ht 67.0 in | Wt 283.0 lb

## 2022-10-30 DIAGNOSIS — U071 COVID-19: Secondary | ICD-10-CM | POA: Diagnosis not present

## 2022-10-30 MED ORDER — NIRMATRELVIR/RITONAVIR (PAXLOVID)TABLET: 3.0000 | ORAL_TABLET | Freq: Two times a day (BID) | ORAL | 0 refills | Status: AC

## 2022-10-30 NOTE — Progress Notes (Signed)
Virtual Visit via Video   I connected with patient on 10/30/22 at 10:40 AM EDT by a video enabled telemedicine application and verified that I am speaking with the correct person using two identifiers.  Location patient: Home Location provider: [place], Office Persons participating in the virtual visit: Patient, Provider, CMA Sheryle Hail C)  I discussed the limitations of evaluation and management by telemedicine and the availability of in person appointments. The patient expressed understanding and agreed to proceed.  Subjective:   HPI:   COVID- sxs started Friday w/ sneezing and 'scratchy throat'.  Saturday had cough and congestion.  Yesterday woke up not able to smell or taste, had a fever, + congestion.  Had + home test yesterday.    ROS:   See pertinent positives and negatives per HPI.  Patient Active Problem List   Diagnosis Date Noted   S/P cholecystectomy 08/11/2022   Unilateral primary osteoarthritis, right hip 12/27/2020   Pain in joint of right hip 07/15/2020   Palpitations 09/19/2019   Morbid obesity ([facility]) 09/19/2019   Cardiac murmur 09/19/2019   Anxiety 05/01/2019   Primary osteoarthritis of both knees 08/30/2018   Vitamin D deficiency 12/14/2016   Plantar fasciitis of left foot 01/06/2014   Numbness and tingling of left leg 06/09/2013   Lumbar back pain 12/23/2012   Lateral epicondylitis 12/23/2012   GERD (gastroesophageal reflux disease) 05/03/2012   Seasonal allergic rhinitis 10/08/2011   General medical examination 10/06/2011   Hair loss 02/21/2011   HTN (hypertension) 02/10/2011   Hyperlipidemia 02/10/2011   Nail fungus 02/10/2011    Social History   Tobacco Use   Smoking status: Never   Smokeless tobacco: Never  Substance Use Topics   Alcohol use: No    Current Outpatient Medications:    acetaminophen (TYLENOL) 325 MG tablet, Take 2 tablets (650 mg total) by mouth every 6 (six) hours as needed., Disp: 30 tablet, Rfl: 0    carvedilol (COREG) 6.25 MG tablet, Take 1 tablet (6.25 mg total) by mouth 2 (two) times daily with a meal., Disp: 180 tablet, Rfl: 1   clotrimazole-betamethasone (LOTRISONE) cream, Apply 1 Application topically daily., Disp: 45 g, Rfl: 1   levonorgestrel (MIRENA) 20 MCG/24HR IUD, 1 each by Intrauterine route once., Disp: , Rfl:    pantoprazole (PROTONIX) 40 MG tablet, TAKE 1 TABLET BY MOUTH TWICE DAILY, Disp: 180 tablet, Rfl: 1   pravastatin (PRAVACHOL) 40 MG tablet, TAKE 1 TABLET(40 MG) BY MOUTH DAILY, Disp: 90 tablet, Rfl: 1   triamterene-hydrochlorothiazide (MAXZIDE-25) 37.5-25 MG tablet, Take 1 tablet by mouth daily., Disp: 90 tablet, Rfl: 1   meloxicam (MOBIC) 7.5 MG tablet, Take 1-2 tablets (7.5-15 mg total) by mouth daily as needed for pain. (Patient not taking: Reported on 10/30/2022), Disp: 30 tablet, Rfl: 0   methocarbamol (ROBAXIN) 500 MG tablet, Take 1 tablet (500 mg total) by mouth every 8 (eight) hours as needed for muscle spasms. (Patient not taking: Reported on 10/30/2022), Disp: 45 tablet, Rfl: 0  Allergies  Allergen Reactions   Bextra [Valdecoxib]     Rash     Objective:   Ht 5\' 7"  (1.702 m)   Wt 283 lb (128.4 kg)   BMI 44.32 kg/m  AAOx3, NAD NCAT, EOMI No obvious CN deficits Coloring WNL Pt is able to speak clearly, coherently without shortness of breath or increased work of breathing.  Thought process is linear.  Mood is appropriate.   Assessment and Plan:   COVID- new.  Pt's sxs are consistent  w/ dx and rapid test confirms.  Start Paxlovid.  Hold Pravastatin.  Reviewed supportive care and red flags that should prompt return.  Pt expressed understanding and is in agreement w/ plan.    Neena Rhymes, MD 10/30/2022

## 2022-11-07 ENCOUNTER — Telehealth: Payer: Self-pay | Admitting: Family Medicine

## 2022-11-07 NOTE — Telephone Encounter (Signed)
Caller name: MARYLEA ROBLYER  On DPR?: Yes  Call back number: [phone] (home)  Provider they see: Sheliah Hatch, MD  Reason for call: Pt post Covid -having severe back pain taking OTC pain medication (Tylenol and Motrin) -advise

## 2022-11-07 NOTE — Telephone Encounter (Signed)
Patient states this started day 3 of covid and it hasn't stopped, even after Covid. She states the OTC medicine helps some but as soon as the med wears off it comes right back. She states sometimes the pain is so bad it's hard to walk.

## 2022-11-07 NOTE — Telephone Encounter (Signed)
I did let patient know that Dr. Beverely Low would definitely want to see her and evaluate this back pain. Dr Beverely Low did not have anything until Thursday but I could get her in with [facility] tomorrow afternoon

## 2022-11-08 ENCOUNTER — Ambulatory Visit: Payer: BC Managed Care – PPO | Admitting: Family Medicine

## 2022-11-20 LAB — HM PAP SMEAR

## 2022-11-25 ENCOUNTER — Other Ambulatory Visit: Payer: Self-pay

## 2022-11-25 ENCOUNTER — Emergency Department (HOSPITAL_BASED_OUTPATIENT_CLINIC_OR_DEPARTMENT_OTHER): Payer: BC Managed Care – PPO

## 2022-11-25 ENCOUNTER — Emergency Department (HOSPITAL_BASED_OUTPATIENT_CLINIC_OR_DEPARTMENT_OTHER)
Admission: EM | Admit: 2022-11-25 | Discharge: 2022-11-25 | Disposition: A | Discharge status: Home / Self Care | Payer: BC Managed Care – PPO | Attending: Emergency Medicine | Admitting: Emergency Medicine

## 2022-11-25 ENCOUNTER — Encounter (HOSPITAL_BASED_OUTPATIENT_CLINIC_OR_DEPARTMENT_OTHER): Payer: Self-pay

## 2022-11-25 DIAGNOSIS — R1011 Right upper quadrant pain: Secondary | ICD-10-CM | POA: Insufficient documentation

## 2022-11-25 DIAGNOSIS — I10 Essential (primary) hypertension: Secondary | ICD-10-CM | POA: Diagnosis not present

## 2022-11-25 DIAGNOSIS — R109 Unspecified abdominal pain: Secondary | ICD-10-CM | POA: Diagnosis present

## 2022-11-25 LAB — CBC WITH DIFFERENTIAL/PLATELET
Abs Immature Granulocytes: 0.01 10*3/uL (ref 0.00–0.07)
Basophils Absolute: 0 10*3/uL (ref 0.0–0.1)
Basophils Relative: 1 %
Eosinophils Absolute: 0.1 10*3/uL (ref 0.0–0.5)
Eosinophils Relative: 2 %
HCT: 37.9 % (ref 36.0–46.0)
Hemoglobin: 12.7 g/dL (ref 12.0–15.0)
Immature Granulocytes: 0 %
Lymphocytes Relative: 32 %
Lymphs Abs: 1.8 10*3/uL (ref 0.7–4.0)
MCH: 29.8 pg (ref 26.0–34.0)
MCHC: 33.5 g/dL (ref 30.0–36.0)
MCV: 89 fL (ref 80.0–100.0)
Monocytes Absolute: 0.6 10*3/uL (ref 0.1–1.0)
Monocytes Relative: 11 %
Neutro Abs: 3 10*3/uL (ref 1.7–7.7)
Neutrophils Relative %: 54 %
Platelets: 243 10*3/uL (ref 150–400)
RBC: 4.26 MIL/uL (ref 3.87–5.11)
RDW: 12.2 % (ref 11.5–15.5)
WBC: 5.5 10*3/uL (ref 4.0–10.5)
nRBC: 0 % (ref 0.0–0.2)

## 2022-11-25 LAB — COMPREHENSIVE METABOLIC PANEL
ALT: 18 U/L (ref 0–44)
AST: 20 U/L (ref 15–41)
Albumin: 3.9 g/dL (ref 3.5–5.0)
Alkaline Phosphatase: 56 U/L (ref 38–126)
Anion gap: 8 (ref 5–15)
BUN: 12 mg/dL (ref 6–20)
CO2: 28 mmol/L (ref 22–32)
Calcium: 8.9 mg/dL (ref 8.9–10.3)
Chloride: 100 mmol/L (ref 98–111)
Creatinine, Ser: 0.81 mg/dL (ref 0.44–1.00)
GFR, Estimated: >60 mL/min (ref >60)
Glucose, Bld: 107 mg/dL — ABNORMAL HIGH (ref 70–99)
Potassium: 4 mmol/L (ref 3.5–5.1)
Sodium: 136 mmol/L (ref 135–145)
Total Bilirubin: 0.8 mg/dL (ref 0.3–1.2)
Total Protein: 8 g/dL (ref 6.5–8.1)

## 2022-11-25 LAB — LIPASE, BLOOD: Lipase: 38 U/L (ref 11–51)

## 2022-11-25 LAB — URINALYSIS, W/ REFLEX TO CULTURE (INFECTION SUSPECTED)
Bilirubin Urine: NEGATIVE
Glucose, UA: NEGATIVE mg/dL
Hgb urine dipstick: NEGATIVE
Ketones, ur: NEGATIVE mg/dL
Leukocytes,Ua: NEGATIVE
Nitrite: NEGATIVE
Protein, ur: NEGATIVE mg/dL
Specific Gravity, Urine: 1.02 (ref 1.005–1.030)
pH: 7 (ref 5.0–8.0)

## 2022-11-25 LAB — D-DIMER, QUANTITATIVE: D-Dimer, Quant: 1.09 ug/mL-FEU — ABNORMAL HIGH (ref 0.00–0.50)

## 2022-11-25 MED ORDER — IOHEXOL 350 MG/ML SOLN
75.0000 mL | Freq: Once | INTRAVENOUS | Status: AC | PRN
  Administered 2022-11-25: 75 mL via INTRAVENOUS

## 2022-11-25 MED ORDER — CYCLOBENZAPRINE HCL 10 MG PO TABS: 10.0000 mg | ORAL_TABLET | Freq: Two times a day (BID) | ORAL | 0 refills | Status: DC | PRN

## 2022-11-25 MED ORDER — LACTATED RINGERS IV BOLUS
1000.0000 mL | Freq: Once | INTRAVENOUS | Status: AC
  Administered 2022-11-25: 1000 mL via INTRAVENOUS

## 2022-11-25 MED ORDER — DICYCLOMINE HCL 10 MG/ML IM SOLN
20.0000 mg | Freq: Once | INTRAMUSCULAR | Status: AC
  Administered 2022-11-25: 20 mg via INTRAMUSCULAR
  Filled 2022-11-25: qty 2

## 2022-11-25 MED ORDER — MORPHINE SULFATE (PF) 4 MG/ML IV SOLN
4.0000 mg | Freq: Once | INTRAVENOUS | Status: AC
  Administered 2022-11-25: 4 mg via INTRAVENOUS
  Filled 2022-11-25: qty 1

## 2022-11-25 MED ORDER — IOHEXOL 300 MG/ML  SOLN
100.0000 mL | Freq: Once | INTRAMUSCULAR | Status: AC | PRN
  Administered 2022-11-25: 100 mL via INTRAVENOUS

## 2022-11-25 MED ORDER — ONDANSETRON HCL 4 MG/2ML IJ SOLN
4.0000 mg | Freq: Once | INTRAMUSCULAR | Status: AC
  Administered 2022-11-25: 4 mg via INTRAVENOUS
  Filled 2022-11-25: qty 2

## 2022-11-25 NOTE — ED Triage Notes (Signed)
Patient having right flank pain. She denied fever, N/V/D or pain when urinating.

## 2022-11-25 NOTE — ED Notes (Signed)
US at bedside

## 2022-11-25 NOTE — ED Provider Notes (Signed)
EMERGENCY DEPARTMENT AT [facility] Provider Note   CSN: [id number] Arrival date & time: 11/25/22  8657     History  Chief Complaint  Patient presents with   Flank Pain    [name] is a 52 y.o. female.  HPI       52 year old female female with a history of hypertension, hyperlipidemia, cholecystectomy January, presents with concern for flank pain.  Reports right-sided abdominal pain has been present waxing waning over the past few weeks, but more significant over the last 2 days.  Reports the pain is now severe, located in the right upper quadrant with radiation towards her right flank.  Also has some pain that radiates down into the right lower quadrant.  It is worse with turning, palpation, deep breaths.  Denies nausea, vomiting, fever, dysuria, diarrhea, numbness, weakness, loss of control or bowel or bladder.  Denies trauma.  Reports she has had constipation.  No hematuria or history of nephrolithiasis.  Denies any chest pain or shortness of breath today but did have dyspnea a few days ago, pain worse with deep breaths.  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   Seasonal allergic rhinitis 10/08/2011   Severe obesity (BMI >= 40) (HCC) 02/10/2011   Vertigo    Vitamin D deficiency 12/14/2016    Home Medications Prior to Admission medications    Medication Sig Start Date End Date Taking? Authorizing Provider  cyclobenzaprine (FLEXERIL) 10 MG tablet Take 1 tablet (10 mg total) by mouth 2 (two) times daily as needed for muscle spasms. 11/25/22  Yes Alvira Monday, MD  acetaminophen (TYLENOL) 325 MG tablet Take 2 tablets (650 mg total) by mouth every 6 (six) hours as needed. 04/08/22   Elayne Snare K, DO  carvedilol (COREG) 6.25 MG tablet Take 1 tablet (6.25 mg total) by mouth 2 (two) times daily with a meal. 04/10/22   Sheliah Hatch, MD  clotrimazole-betamethasone (LOTRISONE) cream Apply 1 Application topically daily. 08/11/22   Sheliah Hatch, MD  levonorgestrel (MIRENA) 20 MCG/24HR IUD 1 each by Intrauterine route once.    [provider]  meloxicam (MOBIC) 7.5 MG tablet Take 1-2 tablets (7.5-15 mg total) by mouth daily as needed for pain. Patient not taking: Reported on 10/30/2022 03/30/22   Shade Flood, MD  pantoprazole (PROTONIX) 40 MG tablet TAKE 1 TABLET BY MOUTH TWICE DAILY 10/23/22   Sheliah Hatch, MD  pravastatin (PRAVACHOL) 40 MG tablet TAKE 1 TABLET(40 MG) BY MOUTH DAILY 10/27/22   Sheliah Hatch, MD  triamterene-hydrochlorothiazide (MAXZIDE-25) 37.5-25 MG tablet Take 1 tablet by mouth daily. 10/23/22   Sheliah Hatch, MD      Allergies    Bextra [valdecoxib]    Review of Systems   Review of Systems  Physical Exam Updated Vital Signs BP  114/70   Pulse 68   Temp 98.8 F (37.1 C) (Oral)   Resp 16   Ht 5\' 7"  (1.702 m)   Wt 130.2 kg   SpO2 97%   BMI 44.95 kg/m  Physical Exam Vitals and nursing note reviewed.  Constitutional:      General: She is not in acute distress.    Appearance: She is well-developed. She is not diaphoretic.  HENT:     Head: Normocephalic and atraumatic.  Eyes:     Conjunctiva/sclera: Conjunctivae normal.  Cardiovascular:     Rate and Rhythm: Normal rate and regular rhythm.     Pulses: Normal pulses.  Pulmonary:     Effort: Pulmonary effort is  normal. No respiratory distress.  Abdominal:     General: There is no distension.     Palpations: Abdomen is soft.     Tenderness: There is abdominal tenderness. There is right CVA tenderness and guarding.  Musculoskeletal:        General: No tenderness.     Cervical back: Normal range of motion.  Skin:    General: Skin is warm and dry.     Findings: No erythema or rash.  Neurological:     Mental Status: She is alert and oriented to person, place, and time.     ED Results / Procedures / Treatments   Labs (all labs ordered are listed, but only abnormal results are displayed) Labs Reviewed  COMPREHENSIVE METABOLIC PANEL - Abnormal; Notable for the following components:      Result Value   Glucose, Bld 107 (*)    All other components within normal limits  URINALYSIS, W/ REFLEX TO CULTURE (INFECTION SUSPECTED) - Abnormal; Notable for the following components:   Bacteria, UA RARE (*)    All other components within normal limits  D-DIMER, QUANTITATIVE - Abnormal; Notable for the following components:   D-Dimer, Quant 1.09 (*)    All other components within normal limits  CBC WITH DIFFERENTIAL/PLATELET  LIPASE, BLOOD    EKG Marcia Chan  Radiology US Venous Img Lower Unilateral Left  Result Date: 11/25/2022 CLINICAL DATA:  Left calf pain for 1 week.  Shortness of breath. EXAM: LEFT LOWER EXTREMITY VENOUS DOPPLER ULTRASOUND TECHNIQUE: Gray-scale sonography with compression, as well as color and duplex ultrasound, were performed to evaluate the deep venous system(s) from the level of the common femoral vein through the popliteal and proximal calf veins. COMPARISON:  Left lower extremity venous Doppler ultrasound 01/12/2017 FINDINGS: VENOUS Normal compressibility of the common femoral, superficial femoral, and popliteal veins, as well as the visualized calf veins. Visualized portions of profunda femoral vein and great saphenous vein unremarkable. No filling defects to suggest DVT on grayscale  or color Doppler imaging. Doppler waveforms show normal direction of venous flow, normal respiratory plasticity and response to augmentation. Limited views of the contralateral common femoral vein are unremarkable. OTHER Marcia Chan. Limitations: Marcia Chan IMPRESSION: Negative. Electronically Signed   By: Neita Garnet M.D.   On: 11/25/2022 11:48   CT Angio Chest PE W and/or Wo Contrast  Result Date: 11/25/2022 CLINICAL DATA:  Right flank pain. EXAM: CT ANGIOGRAPHY CHEST WITH CONTRAST TECHNIQUE: Multidetector CT imaging of the chest was performed using the standard protocol during bolus administration of intravenous contrast. Multiplanar CT image reconstructions and MIPs were obtained to evaluate the vascular anatomy. RADIATION DOSE REDUCTION: This exam was performed according to the departmental dose-optimization program which includes automated exposure control, adjustment of the mA and/or kV according to patient size and/or use  of iterative reconstruction technique. CONTRAST:  75mL OMNIPAQUE IOHEXOL 350 MG/ML SOLN COMPARISON:  Chest x-ray dated July 13, 2021. FINDINGS: Cardiovascular: Satisfactory opacification of the pulmonary arteries to the segmental level. No evidence of pulmonary embolism. Normal heart size. No pericardial effusion. No thoracic aortic aneurysm or dissection. Mediastinum/Nodes: No enlarged mediastinal, hilar, or axillary lymph nodes. Thyroid gland, trachea, and esophagus demonstrate no significant findings. Lungs/Pleura: No focal consolidation, pleural effusion, or pneumothorax. Upper Abdomen: No acute abnormality. Musculoskeletal: No chest wall abnormality. No acute or significant osseous findings. Review of the MIP images confirms the above findings. IMPRESSION: 1. No evidence of pulmonary embolism. No acute intrathoracic process. Electronically Signed   By: Obie Dredge M.D.   On: 11/25/2022 11:31   CT ABDOMEN PELVIS W CONTRAST  Result Date: 11/25/2022 CLINICAL DATA:  Severe right-sided  abdominal and flank pain. Recent cholecystectomy in January. EXAM: CT ABDOMEN AND PELVIS WITH CONTRAST TECHNIQUE: Multidetector CT imaging of the abdomen and pelvis was performed using the standard protocol following bolus administration of intravenous contrast. RADIATION DOSE REDUCTION: This exam was performed according to the departmental dose-optimization program which includes automated exposure control, adjustment of the mA and/or kV according to patient size and/or use of iterative reconstruction technique. CONTRAST:  OMNIPAQUE IOHEXOL 300 MG/ML  SOLN COMPARISON:  CT stone study 04/08/2022 FINDINGS: Lower chest: Unremarkable. Hepatobiliary: Liver measures 19 cm craniocaudal length, enlarged. No suspicious focal abnormality within the liver parenchyma. Gallbladder is surgically absent. Distal common bile duct measures upper normal dash 6 mm diameter. Pancreas: No focal mass lesion. No dilatation of the main duct. No intraparenchymal cyst. No peripancreatic edema. Spleen: No splenomegaly. No focal mass lesion. Adrenals/Urinary Tract: No adrenal nodule or mass. Kidneys unremarkable. No evidence for hydroureter. The urinary bladder appears normal for the degree of distention. Stomach/Bowel: Stomach is unremarkable. No gastric wall thickening. No evidence of outlet obstruction. Duodenum is normally positioned as is the ligament of Treitz. No small bowel wall thickening. No small bowel dilatation. The terminal ileum is normal. The appendix is normal. No gross colonic mass. No colonic wall thickening. Diverticular changes are noted in the left colon without evidence of diverticulitis. Vascular/Lymphatic: No abdominal aortic aneurysm. No abdominal aortic atherosclerotic calcification. There is no gastrohepatic ligament lymphadenopathy. No retroperitoneal or mesenteric lymphadenopathy. Mild lymphadenopathy in the hepatoduodenal ligament is stable, suggesting benign reactive etiology. No pelvic sidewall  lymphadenopathy. Reproductive: IUD is visualized in the uterus. There is no adnexal mass. Other: No intraperitoneal free fluid. Musculoskeletal: Status post right hip replacement. No worrisome lytic or sclerotic osseous abnormality. IMPRESSION: 1. No acute findings in the abdomen or pelvis. Specifically, no findings to explain the patient's history of right sided pain. 2. Hepatomegaly. 3. Left colonic diverticulosis without diverticulitis. Electronically Signed   By: Kennith Center M.D.   On: 11/25/2022 09:01    Procedures Procedures    Medications Ordered in ED Medications  dicyclomine (BENTYL) injection 20 mg (20 mg Intramuscular Given 11/25/22 0813)  iohexol (OMNIPAQUE) 300 MG/ML solution 100 mL (100 mLs Intravenous Contrast Given 11/25/22 0837)  lactated ringers bolus 1,000 mL (0 mLs Intravenous Stopped 11/25/22 1258)  morphine (PF) 4 MG/ML injection 4 mg (4 mg Intravenous Given 11/25/22 1106)  ondansetron (ZOFRAN) injection 4 mg (4 mg Intravenous Given 11/25/22 1106)  iohexol (OMNIPAQUE) 350 MG/ML injection 75 mL (75 mLs Intravenous Contrast Given 11/25/22 1110)    ED Course/ Medical Decision Making/ A&P  52 year old female female with a history of hypertension, hyperlipidemia, cholecystectomy January, presents with concern for flank pain.  Differential diagnosis includes choledocholithiasis, cholangitis, pancreatitis, nephrolithiasis, pyelonephritis, appendicitis, intra-abdominal abscess, PE. Doubt dissection with history, exam with normal bilateral pulses.  No signs of acute spinal emergency.  Labs are completed and personally abided interpreted by me shows no leukocytosis, no anemia, normal transaminases, normal creatinine, normal lipase, urinalysis without infection.   CT abdomen pelvis performed and shows no acute findings.  Consider PE given pleuritic pain, recent dyspnea. DDimer evaluated and positive.  CT PE study then performed showing no evidence of PE.   Did report leg pain while being in the ED-pulses normal bilateral LE and doubt dissection or PE.    Suspect pain most likley musculoskeletal given worse with movements, palpation. Given rx for flexeril. Patient discharged in stable condition with understanding of reasons to return.           Final Clinical Impression(s) / ED Diagnoses Final diagnoses:  Right flank pain    Rx / DC Orders ED Discharge Orders          Ordered    cyclobenzaprine (FLEXERIL) 10 MG tablet  2 times daily PRN        11/25/22 1153              Alvira Monday, MD 11/25/22 [phone]

## 2023-02-08 ENCOUNTER — Encounter: Payer: BC Managed Care – PPO | Admitting: Family Medicine

## 2023-02-15 ENCOUNTER — Encounter: Payer: BC Managed Care – PPO | Admitting: Family Medicine

## 2023-02-15 ENCOUNTER — Encounter (INDEPENDENT_AMBULATORY_CARE_PROVIDER_SITE_OTHER): Payer: Self-pay

## 2023-03-06 ENCOUNTER — Encounter: Payer: Self-pay | Admitting: Family Medicine

## 2023-03-06 ENCOUNTER — Ambulatory Visit (INDEPENDENT_AMBULATORY_CARE_PROVIDER_SITE_OTHER): Payer: BC Managed Care – PPO | Admitting: Family Medicine

## 2023-03-06 VITALS — BP 114/74 | HR 76 | Temp 98.2°F | Resp 18 | Ht 68.75 in | Wt 292.2 lb

## 2023-03-06 DIAGNOSIS — Z0000 Encounter for general adult medical examination without abnormal findings: Secondary | ICD-10-CM

## 2023-03-06 DIAGNOSIS — Z23 Encounter for immunization: Secondary | ICD-10-CM

## 2023-03-06 DIAGNOSIS — L989 Disorder of the skin and subcutaneous tissue, unspecified: Secondary | ICD-10-CM

## 2023-03-06 DIAGNOSIS — E559 Vitamin D deficiency, unspecified: Secondary | ICD-10-CM

## 2023-03-06 LAB — CBC WITH DIFFERENTIAL/PLATELET
Basophils Absolute: 0 10*3/uL (ref 0.0–0.1)
Basophils Relative: 0.6 % (ref 0.0–3.0)
Eosinophils Absolute: 0.2 10*3/uL (ref 0.0–0.7)
Eosinophils Relative: 2.5 % (ref 0.0–5.0)
HCT: 39.5 % (ref 36.0–46.0)
Hemoglobin: 12.9 g/dL (ref 12.0–15.0)
Lymphocytes Relative: 22.6 % (ref 12.0–46.0)
Lymphs Abs: 1.8 10*3/uL (ref 0.7–4.0)
MCHC: 32.7 g/dL (ref 30.0–36.0)
MCV: 89.4 fl (ref 78.0–100.0)
Monocytes Absolute: 0.7 10*3/uL (ref 0.1–1.0)
Monocytes Relative: 9.2 % (ref 3.0–12.0)
Neutro Abs: 5.1 10*3/uL (ref 1.4–7.7)
Neutrophils Relative %: 65.1 % (ref 43.0–77.0)
Platelets: 260 10*3/uL (ref 150.0–400.0)
RBC: 4.42 Mil/uL (ref 3.87–5.11)
RDW: 12.6 % (ref 11.5–15.5)
WBC: 7.8 10*3/uL (ref 4.0–10.5)

## 2023-03-06 LAB — LIPID PANEL
Cholesterol: 149 mg/dL (ref 0–200)
HDL: 34.6 mg/dL — ABNORMAL LOW (ref >39.00)
LDL Cholesterol: 96 mg/dL (ref 0–99)
NonHDL: 114.67
Total CHOL/HDL Ratio: 4
Triglycerides: 92 mg/dL (ref 0.0–149.0)
VLDL: 18.4 mg/dL (ref 0.0–40.0)

## 2023-03-06 LAB — BASIC METABOLIC PANEL
BUN: 13 mg/dL (ref 6–23)
CO2: 25 meq/L (ref 19–32)
Calcium: 9.2 mg/dL (ref 8.4–10.5)
Chloride: 103 meq/L (ref 96–112)
Creatinine, Ser: 0.63 mg/dL (ref 0.40–1.20)
GFR: 101.52 mL/min (ref >60.00)
Glucose, Bld: 91 mg/dL (ref 70–99)
Potassium: 4 meq/L (ref 3.5–5.1)
Sodium: 137 meq/L (ref 135–145)

## 2023-03-06 LAB — HEPATIC FUNCTION PANEL
ALT: 21 U/L (ref 0–35)
AST: 18 U/L (ref 0–37)
Albumin: 3.9 g/dL (ref 3.5–5.2)
Alkaline Phosphatase: 64 U/L (ref 39–117)
Bilirubin, Direct: 0.1 mg/dL (ref 0.0–0.3)
Total Bilirubin: 0.5 mg/dL (ref 0.2–1.2)
Total Protein: 7.1 g/dL (ref 6.0–8.3)

## 2023-03-06 LAB — HEMOGLOBIN A1C: Hgb A1c MFr Bld: 6 % (ref 4.6–6.5)

## 2023-03-06 NOTE — Assessment & Plan Note (Signed)
Deteriorated.  Pt has gained 10 lbs since last visit.  She plans to start an exercise class on Wednesdays.  Provided encouragement.  Stressed low carb diet.  Check labs to risk stratify.  Will follow.

## 2023-03-06 NOTE — Assessment & Plan Note (Signed)
Pt's PE unchanged w/ exception of skin lesion on R cheek.  UTD on pap, mammo, colonoscopy, Tdap.  Flu shot given today.  Check labs.  Anticipatory guidance provided.

## 2023-03-06 NOTE — Patient Instructions (Signed)
Follow up in 6 months to recheck BP and cholesterol We'll notify you of your lab results and make any changes if needed Continue to work on healthy diet and regular exercise- you can do it! If the sleep does not improve in the next month or so, le tme know Call with any questions or concerns Stay Safe!  Stay Healthy! Hang in there!  You are a blessing!

## 2023-03-06 NOTE — Assessment & Plan Note (Signed)
Check labs and replete prn. 

## 2023-03-06 NOTE — Progress Notes (Signed)
   Subjective:    Patient ID: Marcia Chan, female    DOB: May 09, 1971, 52 y.o.   MRN: [id number]  HPI CPE- UTD on pap, Tdap, colonoscopy, Tdap.  Will get flu shot today.  Patient Care Team    Relationship Specialty Notifications Start End  Sheliah Hatch, MD PCP - General Family Medicine  02/10/11   Lynnea Ferrier., MD Referring Physician Obstetrics and Gynecology  06/03/15     Health Maintenance  Topic Date Due   PAP SMEAR-Modifier  07/30/2022   INFLUENZA VACCINE  02/01/2023   Hepatitis C Screening  03/28/2023 (Originally 02/05/1988)   HIV Screening  03/28/2023 (Originally 02/04/1985)   MAMMOGRAM  10/17/2023   Colonoscopy  01/10/2024   DTaP/Tdap/Td (2 - Td or Tdap) 04/17/2024   HPV VACCINES  Aged Out   COVID-19 Vaccine  Discontinued   Zoster Vaccines- Shingrix  Discontinued      Review of Systems Patient reports no vision/ hearing changes, adenopathy,fever,  persistant/recurrent hoarseness , swallowing issues, chest pain, palpitations, edema, persistant/recurrent cough, hemoptysis, dyspnea (rest/exertional/paroxysmal nocturnal), gastrointestinal bleeding (melena, rectal bleeding), abdominal pain, significant heartburn, bowel changes, GU symptoms (dysuria, hematuria, incontinence), Gyn symptoms (abnormal  bleeding, pain),  syncope, focal weakness, memory loss, numbness & tingling, skin/hair/nail changes, abnormal bruising or bleeding, anxiety, or depression.   + 10 lb weight gain- is going to start exercise class on Wednesdays. + poor sleep- 'my brain doesn't rest at night'.  Had a 4 day weekend and was able to sleep well last night.      Objective:   Physical Exam General Appearance:    Alert, cooperative, no distress, appears stated age, obese  Head:    Normocephalic, without obvious abnormality, atraumatic  Eyes:    PERRL, conjunctiva/corneas clear, EOM's intact both eyes  Ears:    Normal TM's and external ear canals, both ears  Nose:   Nares normal, septum midline,  mucosa normal, no drainage    or sinus tenderness  Throat:   Lips, mucosa, and tongue normal; teeth and gums normal  Neck:   Supple, symmetrical, trachea midline, no adenopathy;    Thyroid: no enlargement/tenderness/nodules  Back:     Symmetric, no curvature, ROM normal, no CVA tenderness  Lungs:     Clear to auscultation bilaterally, respirations unlabored  Chest Wall:    No tenderness or deformity   Heart:    Regular rate and rhythm, S1 and S2 normal, no murmur, rub   or gallop  Breast Exam:    Deferred to GYN  Abdomen:     Soft, non-tender, bowel sounds active all four quadrants,    no masses, no organomegaly  Genitalia:    Deferred to GYN  Rectal:    Extremities:   Extremities normal, atraumatic, no cyanosis or edema  Pulses:   2+ and symmetric all extremities  Skin:   Skin color, texture, turgor normal, no rashes.  Rapidly growing lesion that is TTP on R cheek  Lymph nodes:   Cervical, supraclavicular, and axillary nodes normal  Neurologic:   CNII-XII intact, normal strength, sensation and reflexes    throughout          Assessment & Plan:

## 2023-03-07 ENCOUNTER — Other Ambulatory Visit: Payer: Self-pay

## 2023-03-07 ENCOUNTER — Telehealth: Payer: Self-pay

## 2023-03-07 DIAGNOSIS — E559 Vitamin D deficiency, unspecified: Secondary | ICD-10-CM

## 2023-03-07 LAB — VITAMIN D 25 HYDROXY (VIT D DEFICIENCY, FRACTURES): VITD: 24.06 ng/mL — ABNORMAL LOW (ref 30.00–100.00)

## 2023-03-07 LAB — TSH: TSH: 1.95 u[IU]/mL (ref 0.35–5.50)

## 2023-03-07 MED ORDER — VITAMIN D (ERGOCALCIFEROL) 1.25 MG (50000 UNIT) PO CAPS
50000.0000 [IU] | ORAL_CAPSULE | ORAL | 12 refills | Status: DC
Start: 2023-03-07 — End: 2024-04-01

## 2023-03-07 NOTE — Telephone Encounter (Signed)
Left pt a vm w/ results and Vit d has been sent in

## 2023-03-07 NOTE — Telephone Encounter (Signed)
-----   Message from Neena Rhymes sent at 03/07/2023  3:53 PM EDT ----- Labs look great w/ exception of low Vit D.  Based on this, we need to start 50,000 units weekly x12 weeks in addition to daily OTC supplement of at least 2000 units.

## 2023-03-22 ENCOUNTER — Encounter: Payer: Self-pay | Admitting: Family Medicine

## 2023-03-22 ENCOUNTER — Ambulatory Visit: Payer: BC Managed Care – PPO | Admitting: Family Medicine

## 2023-03-22 VITALS — BP 114/70 | HR 75 | Temp 99.1°F | Ht 68.75 in | Wt 289.0 lb

## 2023-03-22 DIAGNOSIS — B9689 Other specified bacterial agents as the cause of diseases classified elsewhere: Secondary | ICD-10-CM

## 2023-03-22 DIAGNOSIS — R059 Cough, unspecified: Secondary | ICD-10-CM

## 2023-03-22 DIAGNOSIS — J329 Chronic sinusitis, unspecified: Secondary | ICD-10-CM

## 2023-03-22 LAB — POC COVID19 BINAXNOW: SARS Coronavirus 2 Ag: NEGATIVE

## 2023-03-22 MED ORDER — AMOXICILLIN 875 MG PO TABS: 875.0000 mg | ORAL_TABLET | Freq: Two times a day (BID) | ORAL | 0 refills | Status: AC

## 2023-03-22 MED ORDER — GUAIFENESIN-CODEINE 100-10 MG/5ML PO SYRP: 10.0000 mL | ORAL_SOLUTION | Freq: Three times a day (TID) | ORAL | 0 refills | Status: DC | PRN

## 2023-03-22 NOTE — Patient Instructions (Signed)
Follow up as needed or as scheduled START the Amoxicillin twice daily- take w/ food Robitussin or Delsym for daytime cough USE the codeine cough syrup as needed for nights/weekends/sleep Drink LOTS of fluids REST! Call with any questions or concerns Hang in there!

## 2023-03-22 NOTE — Progress Notes (Signed)
Subjective:    Patient ID: Marcia Chan, female    DOB: [birth year], 52 y.o.   MRN: [id number]  HPI Cough- sxs started Sunday w/ severe frontal HA.  + nasal congestion.  Developed cough Monday.  Lost voice.  + fever 'all day yesterday'- Tm 101.  + body aches.  No chills.  + diarrhea.  Lots of sick contacts.     Review of Systems For ROS see HPI     Objective:   Physical Exam Vitals reviewed.  Constitutional:      General: She is not in acute distress.    Appearance: She is well-developed. She is not ill-appearing.  HENT:     Head: Normocephalic and atraumatic.     Right Ear: Tympanic membrane normal.     Left Ear: Tympanic membrane normal.     Nose: Mucosal edema and congestion present. No rhinorrhea.     Right Sinus: Maxillary sinus tenderness and frontal sinus tenderness present.     Left Sinus: Maxillary sinus tenderness and frontal sinus tenderness present.     Mouth/Throat:     Pharynx: Uvula midline. No oropharyngeal exudate or posterior oropharyngeal erythema.  Eyes:     General: No scleral icterus.    Extraocular Movements: Extraocular movements intact.     Conjunctiva/sclera: Conjunctivae normal.     Pupils: Pupils are equal, round, and reactive to light.  Cardiovascular:     Rate and Rhythm: Normal rate and regular rhythm.     Heart sounds: Normal heart sounds.  Pulmonary:     Effort: Pulmonary effort is normal. No respiratory distress.     Breath sounds: Normal breath sounds. No wheezing.  Musculoskeletal:     Cervical back: Normal range of motion and neck supple.  Lymphadenopathy:     Cervical: No cervical adenopathy.  Skin:    General: Skin is warm and dry.  Neurological:     General: No focal deficit present.     Mental Status: She is alert and oriented to person, place, and time.     Cranial Nerves: No cranial nerve deficit.     Motor: No weakness.     Coordination: Coordination normal.  Psychiatric:        Mood and Affect: Mood normal.         Behavior: Behavior normal.        Thought Content: Thought content normal.           Assessment & Plan:  Bacterial sinusitis- new.  Thankfully pt's flu and COVID tests are negative.  PE consistent w/ sinus infxn.  Start abx.  Cough syrup prn.  Reviewed supportive care and red flags that should prompt return.  Pt expressed understanding and is in agreement w/ plan.

## 2023-03-26 ENCOUNTER — Encounter: Payer: Self-pay | Admitting: Family Medicine

## 2023-03-26 MED ORDER — FLUCONAZOLE 150 MG PO TABS: 150.0000 mg | ORAL_TABLET | Freq: Once | ORAL | 0 refills | Status: AC

## 2023-03-26 NOTE — Telephone Encounter (Signed)
Pt complains Amoxicillin caused yeast infection tried OTC treatment without relief

## 2023-04-04 ENCOUNTER — Other Ambulatory Visit: Payer: Self-pay

## 2023-04-04 DIAGNOSIS — I10 Essential (primary) hypertension: Secondary | ICD-10-CM

## 2023-04-04 MED ORDER — CARVEDILOL 6.25 MG PO TABS
6.2500 mg | ORAL_TABLET | Freq: Two times a day (BID) | ORAL | 1 refills | Status: DC
Start: 2023-04-04 — End: 2023-09-26

## 2023-04-04 NOTE — Telephone Encounter (Signed)
Received fax from patient's pharmacy   Medication: Carvedilol 6.25 mg Directions: Take 1 tablet by mouth 2 times a day  Last given: 04/10/22 Number refills: 1 Last o/v: 03/22/23 Follow up: 09/03/23 Labs: 03/06/23

## 2023-04-05 ENCOUNTER — Other Ambulatory Visit: Payer: Self-pay

## 2023-04-05 DIAGNOSIS — I10 Essential (primary) hypertension: Secondary | ICD-10-CM

## 2023-04-05 MED ORDER — TRIAMTERENE-HCTZ 37.5-25 MG PO TABS
1.0000 | ORAL_TABLET | Freq: Every day | ORAL | 1 refills | Status: DC
Start: 2023-04-05 — End: 2024-04-01

## 2023-04-05 NOTE — Telephone Encounter (Signed)
Received a fax from patient's pharmacy.  Medication: Triamterene-Hydrochlorothiazide 37.5-25 mg  Directions: Take 1 tablet by mouth daily  Last given: 10/23/22 Number refills: 1 Last o/v: 03/22/23 Follow up: 09/03/23 Labs: 03/06/23

## 2023-05-14 ENCOUNTER — Encounter: Payer: Self-pay | Admitting: Family Medicine

## 2023-05-14 ENCOUNTER — Ambulatory Visit: Payer: BC Managed Care – PPO | Admitting: Family Medicine

## 2023-05-18 ENCOUNTER — Telehealth: Payer: Self-pay | Admitting: Family Medicine

## 2023-05-18 ENCOUNTER — Encounter: Payer: Self-pay | Admitting: Family Medicine

## 2023-05-18 ENCOUNTER — Ambulatory Visit: Payer: BC Managed Care – PPO | Admitting: Family Medicine

## 2023-05-18 NOTE — Telephone Encounter (Signed)
Caller name: TRAVONDA GIRGENTI  On DPR?: Yes  Call back number: [phone] (mobile)  Provider they see: Sheliah Hatch, MD  Reason for call:   Pt called stating she's at ED with her son and will call back an reschedule her appt ASAP.

## 2023-05-18 NOTE — Telephone Encounter (Signed)
FYI about appt being canceled

## 2023-05-18 NOTE — Telephone Encounter (Signed)
I certainly understand and that is far more important than any visit with me.  I will send her a message

## 2023-05-27 ENCOUNTER — Emergency Department (HOSPITAL_BASED_OUTPATIENT_CLINIC_OR_DEPARTMENT_OTHER): Payer: BC Managed Care – PPO

## 2023-05-27 ENCOUNTER — Encounter (HOSPITAL_BASED_OUTPATIENT_CLINIC_OR_DEPARTMENT_OTHER): Payer: Self-pay | Admitting: Emergency Medicine

## 2023-05-27 ENCOUNTER — Emergency Department (HOSPITAL_BASED_OUTPATIENT_CLINIC_OR_DEPARTMENT_OTHER)
Admission: EM | Admit: 2023-05-27 | Discharge: 2023-05-27 | Disposition: A | Discharge status: Home / Self Care | Payer: BC Managed Care – PPO | Attending: Emergency Medicine | Admitting: Emergency Medicine

## 2023-05-27 DIAGNOSIS — I10 Essential (primary) hypertension: Secondary | ICD-10-CM | POA: Insufficient documentation

## 2023-05-27 DIAGNOSIS — M79605 Pain in left leg: Secondary | ICD-10-CM | POA: Diagnosis present

## 2023-05-27 DIAGNOSIS — Z79899 Other long term (current) drug therapy: Secondary | ICD-10-CM | POA: Insufficient documentation

## 2023-05-27 DIAGNOSIS — M5432 Sciatica, left side: Secondary | ICD-10-CM | POA: Diagnosis not present

## 2023-05-27 MED ORDER — IBUPROFEN 600 MG PO TABS: 600.0000 mg | ORAL_TABLET | Freq: Four times a day (QID) | ORAL | 0 refills | Status: AC | PRN

## 2023-05-27 MED ORDER — KETOROLAC TROMETHAMINE 30 MG/ML IJ SOLN
30.0000 mg | Freq: Once | INTRAMUSCULAR | Status: AC
  Administered 2023-05-27: 30 mg via INTRAMUSCULAR
  Filled 2023-05-27: qty 1

## 2023-05-27 MED ORDER — HYDROCODONE-ACETAMINOPHEN 5-325 MG PO TABS: 1.0000 | ORAL_TABLET | ORAL | 0 refills | Status: DC | PRN

## 2023-05-27 MED ORDER — PREDNISONE 50 MG PO TABS: 50.0000 mg | ORAL_TABLET | Freq: Every day | ORAL | 0 refills | Status: DC

## 2023-05-27 MED ORDER — OXYCODONE-ACETAMINOPHEN 5-325 MG PO TABS
1.0000 | ORAL_TABLET | Freq: Once | ORAL | Status: AC
  Administered 2023-05-27: 1 via ORAL
  Filled 2023-05-27: qty 1

## 2023-05-27 MED ORDER — DEXAMETHASONE SODIUM PHOSPHATE 10 MG/ML IJ SOLN
10.0000 mg | Freq: Once | INTRAMUSCULAR | Status: AC
  Administered 2023-05-27: 10 mg via INTRAMUSCULAR
  Filled 2023-05-27: qty 1

## 2023-05-27 NOTE — ED Provider Triage Note (Signed)
Emergency Medicine Provider Triage Evaluation Note  LAKEITA BAK , a 52 y.o. female  was evaluated in triage.  Pt complains of left hip pain now radiating to the left knee. Pain began in the left hip and has since moved to the knee. Has a history of similar pain on the right which ultimately led to a hip replacement on that side. Follows with ortho in [place]. No flank, low back, or buttock pain. No numbness. No CP or SOB.   Review of Systems  Positive: Left hip and knee pain Negative: Back pain, numbness, CP, or SOB  Physical Exam  BP 129/88 (BP Location: Right Arm)   Pulse 81   Temp 98.7 F (37.1 C) (Oral)   Resp 20   Ht 5\' 6"  (1.676 m)   Wt 132.9 kg   SpO2 98%   BMI 47.29 kg/m  Gen:   Awake, no distress; standing at bedside and shifting frequently.  Resp:  Normal effort  MSK:   Ambulatory with frequent shifting.   Medical Decision Making  Medically screening exam initiated at 6:27 AM.  Appropriate orders placed.  NORINE SKORUPSKI was informed that the remainder of the evaluation will be completed by another provider, this initial triage assessment does not replace that evaluation, and the importance of remaining in the ED until their evaluation is complete.  Plan to start with XR and pain medication.    Maia Plan, MD 05/27/23 [phone]

## 2023-05-27 NOTE — ED Notes (Signed)
Patient transported to X-ray 

## 2023-05-27 NOTE — ED Triage Notes (Signed)
Left  hip paint X 1 week developed into hip to knee pain since Friday. Hx of right hip replacement.

## 2023-05-27 NOTE — ED Provider Notes (Signed)
[place] [facility] AT [facility] Provider Note   CSN: [id number] Arrival date & time: 05/27/23  0557     History  Chief Complaint  Patient presents with   Leg Pain    Marcia Chan is a 52 y.o. female.  Pt is a 52 yo female with pmhx significant for hld, htn, gerd, oa, obesity, and anxiety.  Pt said she's had pain in her left hip radiating down her left leg to her knee for the past 3 days.  She could not sleep due to the discomfort.  She had similar pain before having a right hip replacement.         Home Medications Prior to Admission medications   Medication Sig Start Date End Date Taking? Authorizing Provider  carvedilol (COREG) 6.25 MG tablet Take 1 tablet (6.25 mg total) by mouth 2 (two) times daily with a meal. 04/04/23   Sheliah Hatch, MD  fluconazole (DIFLUCAN) 150 MG tablet Take 150 mg by mouth once.    [provider]  guaiFENesin-codeine (ROBITUSSIN AC) 100-10 MG/5ML syrup Take 10 mLs by mouth 3 (three) times daily as needed for cough. 03/22/23   Sheliah Hatch, MD  levonorgestrel (MIRENA) 20 MCG/24HR IUD 1 each by Intrauterine route once.    [provider]  pantoprazole (PROTONIX) 40 MG tablet TAKE 1 TABLET BY MOUTH TWICE DAILY 10/23/22   Sheliah Hatch, MD  pravastatin (PRAVACHOL) 40 MG tablet TAKE 1 TABLET(40 MG) BY MOUTH DAILY 10/27/22   Sheliah Hatch, MD  triamterene-hydrochlorothiazide (MAXZIDE-25) 37.5-25 MG tablet Take 1 tablet by mouth daily. 04/05/23   Sheliah Hatch, MD  Vitamin D, Ergocalciferol, (DRISDOL) 1.25 MG (50000 UNIT) CAPS capsule Take 1 capsule (50,000 Units total) by mouth every 7 (seven) days. 03/07/23   Sheliah Hatch, MD      Allergies    Bextra [valdecoxib]    Review of Systems   Review of Systems  Musculoskeletal:        Left hip and knee pain  All other systems reviewed and are negative.   Physical Exam Updated Vital Signs BP 129/88 (BP Location: Right  Arm)   Pulse 81   Temp 98.7 F (37.1 C) (Oral)   Resp 20   Ht 5\' 6"  (1.676 m)   Wt 132.9 kg   SpO2 98%   BMI 47.29 kg/m  Physical Exam Vitals and nursing note reviewed.  Constitutional:      Appearance: Normal appearance.  HENT:     Head: Normocephalic and atraumatic.     Right Ear: External ear normal.     Left Ear: External ear normal.     Nose: Nose normal.     Mouth/Throat:     Mouth: Mucous membranes are moist.     Pharynx: Oropharynx is clear.  Eyes:     Extraocular Movements: Extraocular movements intact.     Conjunctiva/sclera: Conjunctivae normal.     Pupils: Pupils are equal, round, and reactive to light.  Cardiovascular:     Rate and Rhythm: Normal rate and regular rhythm.     Pulses: Normal pulses.     Heart sounds: Normal heart sounds.  Pulmonary:     Effort: Pulmonary effort is normal.     Breath sounds: Normal breath sounds.  Abdominal:     General: Abdomen is flat. Bowel sounds are normal.     Palpations: Abdomen is soft.  Musculoskeletal:        General: Normal range of  motion.     Cervical back: Normal range of motion and neck supple.       Legs:  Skin:    General: Skin is warm.     Capillary Refill: Capillary refill takes less than 2 seconds.  Neurological:     General: No focal deficit present.     Mental Status: She is alert and oriented to person, place, and time.  Psychiatric:        Mood and Affect: Mood normal.        Behavior: Behavior normal.     ED Results / Procedures / Treatments   Labs (all labs ordered are listed, but only abnormal results are displayed) Labs Reviewed - No data to display  EKG None  Radiology No results found.  Procedures Procedures    Medications Ordered in ED Medications  dexamethasone (DECADRON) injection 10 mg (has no administration in time range)  ketorolac (TORADOL) 30 MG/ML injection 30 mg (has no administration in time range)  oxyCODONE-acetaminophen (PERCOCET/ROXICET) 5-325 MG per tablet  1 tablet (1 tablet Oral Given 05/27/23 7322)    ED Course/ Medical Decision Making/ A&P                                 Medical Decision Making Amount and/or Complexity of Data Reviewed Radiology: ordered.  Risk Prescription drug management.   This patient presents to the ED for concern of pain, this involves an extensive number of treatment options, and is a complaint that carries with it a high risk of complications and morbidity.  The differential diagnosis includes sciatica, oa, muscle strain   Co morbidities that complicate the patient evaluation  hld, htn, gerd, oa, obesity, and anxiety   Additional history obtained:  Additional history obtained from epic chart review   Imaging Studies ordered:  I ordered imaging studies including left hip, left knee, back  I independently visualized and interpreted imaging which showed  L knee: Negative.  L Hip: No acute findings.  2. Mild left hip osteoarthritis.  3. Status post right total hip arthroplasty.   Lumbar: No acute osseous abnormality of the lumbar spine. Mild multilevel  degenerative changes.   I agree with the radiologist interpretation  Medicines ordered and prescription drug management:  I ordered medication including percocet, toradol, decadron  for sx  Reevaluation of the patient after these medicines showed that the patient improved I have reviewed the patients home medicines and have made adjustments as needed   Test Considered:  xr   Critical Interventions:  Pain control   Problem List / ED Course:  Sciatica:  pain has improved.  Pt is stable for d/c.  Return if worse.  F/u with ortho   Reevaluation:  After the interventions noted above, I reevaluated the patient and found that they have :improved   Social Determinants of Health:  Lives at home   Dispostion:  After consideration of the diagnostic results and the patients response to treatment, I feel that the patent would  benefit from discharge with outpatient f/u.          Final Clinical Impression(s) / ED Diagnoses Final diagnoses:  None    Rx / DC Orders ED Discharge Orders     None         Jacalyn Lefevre, MD 05/27/23 [phone]

## 2023-06-06 ENCOUNTER — Encounter: Payer: Self-pay | Admitting: Family Medicine

## 2023-06-06 ENCOUNTER — Ambulatory Visit (HOSPITAL_BASED_OUTPATIENT_CLINIC_OR_DEPARTMENT_OTHER): Payer: BC Managed Care – PPO

## 2023-06-06 ENCOUNTER — Other Ambulatory Visit: Payer: Self-pay | Admitting: Family Medicine

## 2023-06-06 ENCOUNTER — Telehealth: Payer: Self-pay

## 2023-06-06 ENCOUNTER — Ambulatory Visit: Payer: BC Managed Care – PPO | Admitting: Family Medicine

## 2023-06-06 ENCOUNTER — Ambulatory Visit (HOSPITAL_BASED_OUTPATIENT_CLINIC_OR_DEPARTMENT_OTHER)
Admission: RE | Admit: 2023-06-06 | Discharge: 2023-06-06 | Disposition: A | Discharge status: Home / Self Care | Payer: BC Managed Care – PPO | Source: Ambulatory Visit | Attending: Family Medicine | Admitting: Family Medicine

## 2023-06-06 VITALS — BP 122/84 | HR 78 | Temp 97.9°F | Ht 66.0 in | Wt 294.2 lb

## 2023-06-06 DIAGNOSIS — M7989 Other specified soft tissue disorders: Secondary | ICD-10-CM | POA: Insufficient documentation

## 2023-06-06 DIAGNOSIS — M79605 Pain in left leg: Secondary | ICD-10-CM

## 2023-06-06 DIAGNOSIS — J029 Acute pharyngitis, unspecified: Secondary | ICD-10-CM

## 2023-06-06 LAB — POC COVID19 BINAXNOW: SARS Coronavirus 2 Ag: NEGATIVE

## 2023-06-06 MED ORDER — PREDNISONE 10 MG PO TABS
ORAL_TABLET | ORAL | 0 refills | Status: DC
Start: 2023-06-06 — End: 2023-07-05

## 2023-06-06 NOTE — Progress Notes (Signed)
   Subjective:    Patient ID: Marcia Chan, female    DOB: Mar 04, 1971, 52 y.o.   MRN: [id number]  HPI Leg swelling- pt reports swelling started ~1 month ago.  L>R.  No pain but socks and pant legs were 'too tight'.  'it felt like I had weights around my legs'.  Then developed L leg pain- worse at night.  Described as a 'squeezing' pain.  Severe pain lasted 3 days- went to the ER.  Pain runs from buttock to ankle on L side down the back of the leg.  Was given Ibuprofen 600mg  daily, Prednisone 50mg  x5 days, hydrocodone.  Pain relief was only temporary.    URI- woke up coughing yesterday and throughout the day lost her voice.  + PND but denies sinus congestion.  Feels that things are improving   Review of Systems For ROS see HPI     Objective:   Physical Exam Vitals reviewed.  Constitutional:      General: She is not in acute distress.    Appearance: Normal appearance. She is not ill-appearing.  HENT:     Head: Normocephalic and atraumatic.     Nose: No congestion or rhinorrhea.     Comments: No TTP over frontal or maxillary sinuses Eyes:     Extraocular Movements: Extraocular movements intact.     Conjunctiva/sclera: Conjunctivae normal.  Cardiovascular:     Rate and Rhythm: Normal rate and regular rhythm.  Pulmonary:     Effort: Pulmonary effort is normal. No respiratory distress.     Breath sounds: No wheezing or rhonchi.  Musculoskeletal:        General: Tenderness (TTP over L calf and posterior thigh) present.     Cervical back: Neck supple.     Right lower leg: Edema (trace- 1+) present.     Left lower leg: Edema (1-2+) present.  Lymphadenopathy:     Cervical: No cervical adenopathy.  Skin:    General: Skin is warm and dry.  Neurological:     General: No focal deficit present.     Mental Status: She is alert and oriented to person, place, and time.  Psychiatric:        Mood and Affect: Mood normal.        Behavior: Behavior normal.        Thought Content: Thought  content normal.           Assessment & Plan:  L leg pain and swelling- new.  Pt reports pain has been intense.  Severe enough to send her to ER.  At that time was given 5 days of Prednisone with only temporary relief.  Given TTP over L calf and posterior thigh will get venous doppler to r/o DVT.  Given radiation of pain, nerve irritation/inflammation is suspected.  Start Prednisone taper to improve pain.  Encouraged leg elevation, compression socks, low salt diet to help w/ swelling.  Reviewed supportive care and red flags that should prompt return.  Pt expressed understanding and is in agreement w/ plan.   Sore throat- new.  COVID test negative.  Sxs are improving w/ exception of laryngitis.  Viral illness.  Reviewed supportive care and red flags that should prompt return.  Pt expressed understanding and is in agreement w/ plan.

## 2023-06-06 NOTE — Telephone Encounter (Signed)
Pt has reviewed via MyChart

## 2023-06-06 NOTE — Patient Instructions (Signed)
Follow up as needed or as scheduled We'll notify you of your ultrasound results START the Prednisone as directed- 3 pills at the same time x3 days, then 2 pills at the same time x3 days, then 1 pill daily.  Take w/ food  Heat using a heating pad for pain relief Call with any questions or concerns Hang in there!!

## 2023-06-06 NOTE — Telephone Encounter (Signed)
-----   Message from Neena Rhymes sent at 06/06/2023 12:37 PM EST ----- No evidence of clot- this is great news!!  Please let me know if things improve with the Prednisone.  If not, we'll refer you to Ortho for a complete evaluation.

## 2023-06-26 ENCOUNTER — Other Ambulatory Visit: Payer: Self-pay | Admitting: Family Medicine

## 2023-06-28 ENCOUNTER — Other Ambulatory Visit: Payer: Self-pay

## 2023-06-28 DIAGNOSIS — I10 Essential (primary) hypertension: Secondary | ICD-10-CM

## 2023-06-28 MED ORDER — PRAVASTATIN SODIUM 40 MG PO TABS
ORAL_TABLET | ORAL | 1 refills | Status: DC
Start: 2023-06-28 — End: 2024-01-07

## 2023-07-05 ENCOUNTER — Encounter: Payer: Self-pay | Admitting: Family Medicine

## 2023-07-05 ENCOUNTER — Ambulatory Visit (INDEPENDENT_AMBULATORY_CARE_PROVIDER_SITE_OTHER): Payer: BC Managed Care – PPO | Admitting: Family Medicine

## 2023-07-05 VITALS — BP 128/74 | HR 78 | Temp 98.7°F | Ht 66.0 in | Wt 289.5 lb

## 2023-07-05 DIAGNOSIS — R0981 Nasal congestion: Secondary | ICD-10-CM

## 2023-07-05 DIAGNOSIS — R0683 Snoring: Secondary | ICD-10-CM | POA: Diagnosis not present

## 2023-07-05 DIAGNOSIS — G4730 Sleep apnea, unspecified: Secondary | ICD-10-CM

## 2023-07-05 MED ORDER — MOMETASONE FUROATE 50 MCG/ACT NA SUSP: 2.0000 | Freq: Every day | NASAL | 12 refills | Status: DC

## 2023-07-05 NOTE — Patient Instructions (Addendum)
 Follow up as needed or as scheduled START the Nasonex spray daily CONTINUE the Claritin daily We'll call you to schedule your sleep apnea appt with pulmonary Call with any questions or concerns Stay Safe!  Stay Healthy! Happy New Year!

## 2023-07-05 NOTE — Progress Notes (Signed)
   Subjective:    Patient ID: Marcia Chan, female    DOB: 1970/08/31, 53 y.o.   MRN: [id number]  HPI Snoring- pt reports husband has been complaining for the last 6 months about her loud snoring.  This was new at the time.  In the past 3 months, he notes pt stops breathing multiple times at night.  Over break, she shared a bed w/ mom who noted she stopped breathing.  Wakes w/ sore throat daily, frequent hoarseness.  Notes frequent congestion.  Was taking daily Claritin but stopped for ~1 month.  + fatigue.   Review of Systems For ROS see HPI     Objective:   Physical Exam Vitals reviewed.  Constitutional:      General: She is not in acute distress.    Appearance: Normal appearance. She is obese. She is not ill-appearing.  HENT:     Head: Normocephalic and atraumatic.     Nose: Congestion present. No rhinorrhea.     Comments: Swollen turbinates    Mouth/Throat:     Pharynx: Posterior oropharyngeal erythema (copious PND) present. No oropharyngeal exudate.  Eyes:     Extraocular Movements: Extraocular movements intact.     Conjunctiva/sclera: Conjunctivae normal.  Musculoskeletal:     Cervical back: Neck supple.  Lymphadenopathy:     Cervical: No cervical adenopathy.  Skin:    General: Skin is warm and dry.  Neurological:     General: No focal deficit present.     Mental Status: She is alert and oriented to person, place, and time.  Psychiatric:        Mood and Affect: Mood normal.        Behavior: Behavior normal.        Thought Content: Thought content normal.           Assessment & Plan:  Loud Snoring/Sleep Apnea- new.  Pt reports sxs of snoring started 6 months ago.  In the last 3 months, there have been reports of breathing pauses.  Given these reports, she has sleep apnea but we need to determine the underlying cause and treat appropriately.  Will start w/ Pulmonary referral for sleep study but given the amount of turbinate swelling, may need to see ENT.  Will  start Nasonex  daily in addition to daily Claritin.  Pt expressed understanding and is in agreement w/ plan.   Chronic congestion- new.  Pt reports she did not have any issues w/ congestion or PND until she had COVID last spring.  Now is constantly clearing her throat, battling hoarseness.  She is taking daily antihistamine.  Will add nasal steroid.  If no improvement will consider ENT or [facility]/Asthma or both.  Pt expressed understanding and is in agreement w/ plan.

## 2023-07-19 ENCOUNTER — Telehealth: Payer: Self-pay | Admitting: Family Medicine

## 2023-07-19 NOTE — Telephone Encounter (Signed)
Made visit to Pulmologist for pt. March 4.

## 2023-07-19 NOTE — Telephone Encounter (Signed)
Copied from CRM [id number]. Topic: Referral - Question >> Jul 19, 2023 11:50 AM Fredrich Romans wrote: Reason for CRM: patient called in stating that she is having a hard time getting [facility] on the phone to get set up for an appointment.She has called several times but unable to get through.

## 2023-09-03 ENCOUNTER — Encounter: Payer: Self-pay | Admitting: Family Medicine

## 2023-09-03 ENCOUNTER — Ambulatory Visit: Payer: 59 | Admitting: Family Medicine

## 2023-09-03 VITALS — BP 126/84 | HR 78 | Temp 97.8°F | Ht 66.0 in | Wt 303.0 lb

## 2023-09-03 DIAGNOSIS — Z6841 Body Mass Index (BMI) 40.0 and over, adult: Secondary | ICD-10-CM | POA: Diagnosis not present

## 2023-09-03 DIAGNOSIS — Z114 Encounter for screening for human immunodeficiency virus [HIV]: Secondary | ICD-10-CM

## 2023-09-03 DIAGNOSIS — I10 Essential (primary) hypertension: Secondary | ICD-10-CM

## 2023-09-03 DIAGNOSIS — Z1159 Encounter for screening for other viral diseases: Secondary | ICD-10-CM

## 2023-09-03 DIAGNOSIS — E782 Mixed hyperlipidemia: Secondary | ICD-10-CM | POA: Diagnosis not present

## 2023-09-03 LAB — CBC WITH DIFFERENTIAL/PLATELET
Basophils Absolute: 0 10*3/uL (ref 0.0–0.1)
Basophils Relative: 0.3 % (ref 0.0–3.0)
Eosinophils Absolute: 0.1 10*3/uL (ref 0.0–0.7)
Eosinophils Relative: 1.6 % (ref 0.0–5.0)
HCT: 37.6 % (ref 36.0–46.0)
Hemoglobin: 12.6 g/dL (ref 12.0–15.0)
Lymphocytes Relative: 26.6 % (ref 12.0–46.0)
Lymphs Abs: 1.4 10*3/uL (ref 0.7–4.0)
MCHC: 33.4 g/dL (ref 30.0–36.0)
MCV: 89.9 fl (ref 78.0–100.0)
Monocytes Absolute: 0.5 10*3/uL (ref 0.1–1.0)
Monocytes Relative: 9.4 % (ref 3.0–12.0)
Neutro Abs: 3.3 10*3/uL (ref 1.4–7.7)
Neutrophils Relative %: 62.1 % (ref 43.0–77.0)
Platelets: 256 10*3/uL (ref 150.0–400.0)
RBC: 4.18 Mil/uL (ref 3.87–5.11)
RDW: 12.6 % (ref 11.5–15.5)
WBC: 5.3 10*3/uL (ref 4.0–10.5)

## 2023-09-03 LAB — LIPID PANEL
Cholesterol: 140 mg/dL (ref 0–200)
HDL: 39.2 mg/dL (ref >39.00)
LDL Cholesterol: 86 mg/dL (ref 0–99)
NonHDL: 100.39
Total CHOL/HDL Ratio: 4
Triglycerides: 70 mg/dL (ref 0.0–149.0)
VLDL: 14 mg/dL (ref 0.0–40.0)

## 2023-09-03 LAB — HEMOGLOBIN A1C: Hgb A1c MFr Bld: 6.4 % (ref 4.6–6.5)

## 2023-09-03 LAB — HEPATIC FUNCTION PANEL
ALT: 23 U/L (ref 0–35)
AST: 22 U/L (ref 0–37)
Albumin: 3.9 g/dL (ref 3.5–5.2)
Alkaline Phosphatase: 50 U/L (ref 39–117)
Bilirubin, Direct: 0.1 mg/dL (ref 0.0–0.3)
Total Bilirubin: 0.5 mg/dL (ref 0.2–1.2)
Total Protein: 7.1 g/dL (ref 6.0–8.3)

## 2023-09-03 LAB — BASIC METABOLIC PANEL
BUN: 10 mg/dL (ref 6–23)
CO2: 31 meq/L (ref 19–32)
Calcium: 9.5 mg/dL (ref 8.4–10.5)
Chloride: 99 meq/L (ref 96–112)
Creatinine, Ser: 0.74 mg/dL (ref 0.40–1.20)
GFR: 92.27 mL/min (ref >60.00)
Glucose, Bld: 109 mg/dL — ABNORMAL HIGH (ref 70–99)
Potassium: 3.7 meq/L (ref 3.5–5.1)
Sodium: 138 meq/L (ref 135–145)

## 2023-09-03 LAB — TSH: TSH: 0.85 u[IU]/mL (ref 0.35–5.50)

## 2023-09-03 NOTE — Assessment & Plan Note (Signed)
 Chronic problem.  Adequate  control on Carvedilol 6.25mg  BID and Triamterene hydrochlorothiazide 37.5/25mg  daily.  Currently asymptomatic w/ exception of fatigue and some SOB w/ stairs.  This may be due to recent weight gain.  Check labs due to diuretic use but no anticipated med changes.

## 2023-09-03 NOTE — Progress Notes (Signed)
   Subjective:    Patient ID: ALOHA BARTOK, female    DOB: Dec 20, 1970, 53 y.o.   MRN: [id number]  HPI HTN- chronic problem, on Carvedilol 6.25mg  BID, Triamterene hydrochlorothiazide 37.5/25mg  daily w/ good control.  Reports some decreased energy level.  No CP.  Some SOB w/ stairs.  No visual changes.  Hyperlipidemia- chronic problem, on Pravastatin 40mg  daily.  No abd pain, N/V.  Obesity- pt has gained 13 lbs since last visit.  BMI now 48.81  No regular exercise- 'i be so tired'.  Pt wants to change her routine to allow her to walk in the afternoon.   Review of Systems For ROS see HPI     Objective:   Physical Exam Vitals reviewed.  Constitutional:      General: She is not in acute distress.    Appearance: Normal appearance. She is well-developed. She is obese. She is not ill-appearing.  HENT:     Head: Normocephalic and atraumatic.  Eyes:     Conjunctiva/sclera: Conjunctivae normal.     Pupils: Pupils are equal, round, and reactive to light.  Neck:     Thyroid: No thyromegaly.  Cardiovascular:     Rate and Rhythm: Normal rate and regular rhythm.     Heart sounds: Normal heart sounds. No murmur heard. Pulmonary:     Effort: Pulmonary effort is normal. No respiratory distress.     Breath sounds: Normal breath sounds.  Abdominal:     General: There is no distension.     Palpations: Abdomen is soft.     Tenderness: There is no abdominal tenderness.  Musculoskeletal:     Cervical back: Normal range of motion and neck supple.  Lymphadenopathy:     Cervical: No cervical adenopathy.  Skin:    General: Skin is warm and dry.  Neurological:     General: No focal deficit present.     Mental Status: She is alert and oriented to person, place, and time.  Psychiatric:        Mood and Affect: Mood normal.        Behavior: Behavior normal.        Thought Content: Thought content normal.           Assessment & Plan:

## 2023-09-03 NOTE — Patient Instructions (Signed)
 Schedule your complete physical in 6 months We'll notify you of your lab results and make any changes if needed Continue to work on low carb/low sugar diet and regular exercise Check out [employer] and see if you are able to add that to your busy life Call with any questions or concerns Stay Safe!  Stay Healthy! Hang in there!

## 2023-09-03 NOTE — Progress Notes (Deleted)
 @Patient  ID: Marcia Chan, female    DOB: 1970-10-19, 53 y.o.   MRN: [id number]  No chief complaint on file.   Referring provider: Sheliah Hatch, MD  HPI: 53 year old female, never smoked.  Past medical history significant for pretension, seasonal allergic rhinitis, GERD, cardiac murmur, hyperlipidemia, morbid obesity, vitamin D deficiency.  09/04/2023 Patient presents today for sleep consult.      Allergies  Allergen Reactions   Bextra [Valdecoxib]     Rash     Immunization History  Administered Date(s) Administered   Influenza Split 05/03/2012   Influenza, Seasonal, Injecte, Preservative Fre 03/06/2023   Influenza,inj,Quad PF,6+ Mos 05/06/2013, 04/17/2014, 05/07/2015, 04/17/2016, 04/12/2017, 04/10/2018, 05/01/2019, 05/07/2020, 04/11/2021, 04/13/2022   Influenza-Unspecified 05/03/2012   MMR 02/26/1992   PFIZER Comirnaty(Gray Top)Covid-19 Tri-Sucrose Vaccine 09/06/2019, 09/27/2019   PFIZER(Purple Top)SARS-COV-2 Vaccination 09/06/2019, 09/27/2019, 05/15/2020   Pfizer Covid-19 Vaccine Bivalent Booster 65yrs & up 05/28/2020   Tdap 04/17/2014    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   Seasonal allergic rhinitis 10/08/2011   Severe obesity (BMI >= 40) (HCC) 02/10/2011   Vertigo    Vitamin D deficiency 12/14/2016    Tobacco History: Social History    Tobacco Use  Smoking Status Never  Smokeless Tobacco Never   Counseling given: Not Answered   Outpatient Medications Prior to Visit  Medication Sig Dispense Refill   carvedilol (COREG) 6.25 MG tablet Take 1 tablet (6.25 mg total) by mouth 2 (two) times daily with a meal. 180 tablet 1   ibuprofen (ADVIL) 600 MG tablet Take 1 tablet (600 mg total) by mouth every 6 (six) hours as needed. 30 tablet 0   levonorgestrel (MIRENA) 20 MCG/24HR IUD 1 each by Intrauterine route once.     mometasone (NASONEX) 50 MCG/ACT nasal spray Place 2 sprays into the nose daily. 1 each 12   pantoprazole (PROTONIX) 40 MG tablet TAKE 1 TABLET BY MOUTH TWICE DAILY 180 tablet 1   pravastatin (PRAVACHOL) 40 MG tablet TAKE 1 TABLET(40 MG) BY MOUTH DAILY 90 tablet 1   triamterene-hydrochlorothiazide (MAXZIDE-25) 37.5-25 MG tablet Take 1 tablet by mouth daily. 90 tablet 1   Vitamin D, Ergocalciferol, (DRISDOL) 1.25 MG (50000 UNIT) CAPS capsule Take 1 capsule (50,000 Units total) by mouth every 7 (seven) days. 7 capsule 12   No facility-administered medications prior to visit.      Review of Systems  Review of Systems   Physical Exam  There were no vitals taken for this visit. Physical Exam   Lab Results:  CBC    Component Value Date/Time   WBC 5.3 09/03/2023 0917   RBC 4.18 09/03/2023 0917   HGB 12.6 09/03/2023 0917   HGB 13.2 02/18/2020 0915   HCT 37.6 09/03/2023 0917   HCT 39.5 02/18/2020 0915   PLT 256.0 09/03/2023 0917  PLT 237 02/18/2020 0915   MCV 89.9 09/03/2023 0917   MCV 90 02/18/2020 0915   MCH 29.8 11/25/2022 0742   MCHC 33.4 09/03/2023 0917   RDW 12.6 09/03/2023 0917   RDW 11.9 02/18/2020 0915   LYMPHSABS 1.4 09/03/2023 0917   LYMPHSABS 1.7 02/18/2020 0915   MONOABS 0.5 09/03/2023 0917   EOSABS 0.1 09/03/2023 0917   EOSABS 0.1 02/18/2020 0915   BASOSABS 0.0 09/03/2023 0917   BASOSABS 0.0 02/18/2020 0915    BMET    Component Value Date/Time   NA 138 09/03/2023 0917    NA 140 02/18/2020 0915   K 3.7 09/03/2023 0917   CL 99 09/03/2023 0917   CO2 31 09/03/2023 0917   GLUCOSE 109 (H) 09/03/2023 0917   Chan 10 09/03/2023 0917   Chan 10 02/18/2020 0915   CREATININE 0.74 09/03/2023 0917   CREATININE 0.62 05/20/2021 1450   CALCIUM 9.5 09/03/2023 0917   GFRNONAA >60 11/25/2022 0742   GFRNONAA 104 08/12/2018 0923   GFRAA 103 02/18/2020 0915   GFRAA 121 08/12/2018 0923    BNP No results found for: "BNP"  ProBNP No results found for: "PROBNP"  Imaging: No results found.   Assessment & Plan:   No problem-specific Assessment & Plan notes found for this encounter.     Glenford Bayley, NP 09/03/2023

## 2023-09-03 NOTE — Assessment & Plan Note (Signed)
 Chronic problem.  On Pravastatin 40mg  daily w/o difficulty.  Check labs.  Adjust meds prn

## 2023-09-03 NOTE — Assessment & Plan Note (Signed)
 Deteriorated.  Pt has gained 13 lbs since last visit.  BMI now 48.81.  She is not exercising and admits to stress eating- particularly late in the day.  She knows this is contributing to her weight gain.  Discussed need for physical activity and encouraged her to eat smaller amounts throughout the day.  Will follow.

## 2023-09-04 ENCOUNTER — Encounter: Payer: Self-pay | Admitting: Family Medicine

## 2023-09-04 ENCOUNTER — Institutional Professional Consult (permissible substitution): Payer: BC Managed Care – PPO | Admitting: Primary Care

## 2023-09-04 LAB — HIV ANTIBODY (ROUTINE TESTING W REFLEX): HIV 1&2 Ab, 4th Generation: NONREACTIVE

## 2023-09-04 LAB — HEPATITIS C ANTIBODY: Hepatitis C Ab: NONREACTIVE

## 2023-09-19 ENCOUNTER — Encounter: Payer: Self-pay | Admitting: Family Medicine

## 2023-09-19 DIAGNOSIS — G4730 Sleep apnea, unspecified: Secondary | ICD-10-CM

## 2023-09-26 ENCOUNTER — Other Ambulatory Visit: Payer: Self-pay | Admitting: Family Medicine

## 2023-09-26 DIAGNOSIS — I10 Essential (primary) hypertension: Secondary | ICD-10-CM

## 2023-10-14 ENCOUNTER — Other Ambulatory Visit: Payer: Self-pay | Admitting: Family Medicine

## 2023-11-02 ENCOUNTER — Encounter: Payer: Self-pay | Admitting: Primary Care

## 2023-11-02 ENCOUNTER — Ambulatory Visit: Admitting: Primary Care

## 2023-11-02 VITALS — BP 108/76 | HR 81 | Temp 96.2°F | Ht 66.0 in | Wt 298.8 lb

## 2023-11-02 DIAGNOSIS — R0683 Snoring: Secondary | ICD-10-CM | POA: Diagnosis not present

## 2023-11-02 DIAGNOSIS — U099 Post covid-19 condition, unspecified: Secondary | ICD-10-CM | POA: Diagnosis not present

## 2023-11-02 DIAGNOSIS — K219 Gastro-esophageal reflux disease without esophagitis: Secondary | ICD-10-CM

## 2023-11-02 MED ORDER — ALBUTEROL SULFATE HFA 108 (90 BASE) MCG/ACT IN AERS: 2.0000 | INHALATION_SPRAY | Freq: Four times a day (QID) | RESPIRATORY_TRACT | 0 refills | Status: AC | PRN

## 2023-11-02 NOTE — Progress Notes (Signed)
 @Patient  ID: Marcia Chan, female    DOB: [birth year], 53 y.o.   MRN: [id number]  No chief complaint on file.   Referring provider: Jess Morita, MD  HPI: 53 year old female, never smoked.  Past medical history significant for hypertension, seasonal allergic rhinitis, GERD, cardiac murmur, hyperlipidemia, morbid obesity.  11/02/2023 Discussed the use of AI scribe software for clinical note transcription with the patient, who gave verbal consent to proceed.  History of Present Illness   Marcia Chan is a 53 year old female who presents with sleep disturbances and suspected sleep apnea. She was referred by her primary care physician for evaluation of her sleep disturbances and daytime fatigue.  She experiences loud snoring that disrupts her husband's sleep and has been informed by her husband and mother that she stops breathing during sleep. She also wakes up gasping or choking. These symptoms are new.  She goes to bed at 9 PM, taking about 15 minutes to fall asleep, but her sleep is restless, waking up three to four times a night, and she starts her day at 5:30 AM. Despite this, she feels tired upon waking and experiences daytime fatigue, often feeling exhausted. She has not undergone a sleep study before and does not use CPAP or oxygen.  She has gained 20 pounds recently but does not notice a correlation between the weight gain and worsening sleep quality or snoring. She experiences shortness of breath more frequently in the past three to four months, along with hand and feet swelling and joint stiffness. No history of cardiac issues, COPD, asthma, seizures, or sleepwalking.  She contracted COVID-19 last spring (2024) and reports increased frequency of illness and various symptoms, including leg pains, since then. A CT scan in May 2024 showed clear lungs, normal pulmonary arteries, and a normal-sized heart. An ultrasound of her legs showed no blood clots.  She lives with  her husband and son and works as a Runner, broadcasting/film/video. She has a history of high blood pressure, high cholesterol, and acid reflux, and she had her gallbladder removed in 2024. She denies smoking and has no history of asthma as a child.      Allergies  Allergen Reactions   Bextra [Valdecoxib]     Rash     Immunization History  Administered Date(s) Administered   Influenza Split 05/03/2012   Influenza, Seasonal, Injecte, Preservative Fre 03/06/2023   Influenza,inj,Quad PF,6+ Mos 05/06/2013, 04/17/2014, 05/07/2015, 04/17/2016, 04/12/2017, 04/10/2018, 05/01/2019, 05/07/2020, 04/11/2021, 04/13/2022   Influenza-Unspecified 05/03/2012   MMR 02/26/1992   PFIZER Comirnaty(Gray Top)Covid-19 Tri-Sucrose Vaccine 09/06/2019, 09/27/2019   PFIZER(Purple Top)SARS-COV-2 Vaccination 09/06/2019, 09/27/2019, 05/15/2020   Pfizer Covid-19 Vaccine Bivalent Booster 64yrs & up 05/28/2020   Tdap 04/17/2014    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   Seasonal allergic rhinitis 10/08/2011   Severe obesity (  BMI >= 40) (HCC) 02/10/2011   Vertigo    Vitamin D  deficiency 12/14/2016    Tobacco History: Social History   Tobacco Use  Smoking Status Never  Smokeless Tobacco Never   Counseling given: Not Answered   Outpatient Medications Prior to Visit  Medication Sig Dispense Refill   carvedilol  (COREG ) 6.25 MG  tablet TAKE 1 TABLET(6.25 MG) BY MOUTH TWICE DAILY WITH A MEAL 180 tablet 1   ibuprofen  (ADVIL ) 600 MG tablet Take 1 tablet (600 mg total) by mouth every 6 (six) hours as needed. 30 tablet 0   levonorgestrel (MIRENA) 20 MCG/24HR IUD 1 each by Intrauterine route once.     mometasone  (NASONEX ) 50 MCG/ACT nasal spray Place 2 sprays into the nose daily. 1 each 12   pantoprazole  (PROTONIX ) 40 MG tablet TAKE 1 TABLET BY MOUTH TWICE DAILY 180 tablet 1   pravastatin  (PRAVACHOL ) 40 MG tablet TAKE 1 TABLET(40 MG) BY MOUTH DAILY 90 tablet 1   triamterene -hydrochlorothiazide (MAXZIDE-25) 37.5-25 MG tablet Take 1 tablet by mouth daily. 90 tablet 1   Vitamin D , Ergocalciferol , (DRISDOL ) 1.25 MG (50000 UNIT) CAPS capsule Take 1 capsule (50,000 Units total) by mouth every 7 (seven) days. 7 capsule 12   No facility-administered medications prior to visit.    Review of Systems  Review of Systems  Constitutional:  Positive for fatigue.  HENT: Negative.    Respiratory: Negative.    Cardiovascular: Negative.   Psychiatric/Behavioral:  Positive for sleep disturbance.     Physical Exam  There were no vitals taken for this visit. Physical Exam Constitutional:      Appearance: Normal appearance.  HENT:     Head: Normocephalic and atraumatic.     Mouth/Throat:     Mouth: Mucous membranes are moist.     Pharynx: Oropharynx is clear.     Comments: Mallampati class I Cardiovascular:     Rate and Rhythm: Normal rate and regular rhythm.  Pulmonary:     Effort: Pulmonary effort is normal.     Breath sounds: Normal breath sounds.  Neurological:     General: No focal deficit present.     Mental Status: She is alert and oriented to person, place, and time. Mental status is at baseline.  Psychiatric:        Mood and Affect: Mood normal.        Behavior: Behavior normal.        Thought Content: Thought content normal.        Judgment: Judgment normal.      Lab Results:  CBC    Component Value  Date/Time   WBC 5.3 09/03/2023 0917   RBC 4.18 09/03/2023 0917   HGB 12.6 09/03/2023 0917   HGB 13.2 02/18/2020 0915   HCT 37.6 09/03/2023 0917   HCT 39.5 02/18/2020 0915   PLT 256.0 09/03/2023 0917   PLT 237 02/18/2020 0915   MCV 89.9 09/03/2023 0917   MCV 90 02/18/2020 0915   MCH 29.8 11/25/2022 0742   MCHC 33.4 09/03/2023 0917   RDW 12.6 09/03/2023 0917   RDW 11.9 02/18/2020 0915   LYMPHSABS 1.4 09/03/2023 0917   LYMPHSABS 1.7 02/18/2020 0915   MONOABS 0.5 09/03/2023 0917   EOSABS 0.1 09/03/2023 0917   EOSABS 0.1 02/18/2020 0915   BASOSABS 0.0 09/03/2023 0917   BASOSABS 0.0 02/18/2020 0915    BMET    Component Value Date/Time   NA 138 09/03/2023 0917   NA 140 02/18/2020 0915   K 3.7 09/03/2023 0917  CL 99 09/03/2023 0917   CO2 31 09/03/2023 0917   GLUCOSE 109 (H) 09/03/2023 0917   BUN 10 09/03/2023 0917   BUN 10 02/18/2020 0915   CREATININE 0.74 09/03/2023 0917   CREATININE 0.62 05/20/2021 1450   CALCIUM 9.5 09/03/2023 0917   GFRNONAA >60 11/25/2022 0742   GFRNONAA 104 08/12/2018 0923   GFRAA 103 02/18/2020 0915   GFRAA 121 08/12/2018 0923    BNP No results found for: "BNP"  ProBNP No results found for: "PROBNP"  Imaging: No results found.   Assessment & Plan:   1. Loud snoring (Primary) - Home sleep test; Future   Assessment and Plan    Obstructive Sleep Apnea (OSA) Suspected OSA due to symptoms of loud snoring, witnessed apneas, daytime fatigue, and morning tongue soreness for the past 3-4 months.  Risks of untreated OSA include cardiac arrhythmias, stroke, diabetes, pulmonary hypertension, and potential link to Alzheimer's. Explained OSA mechanism as airway obstruction, often by the tongue during sleep. Treatment options depend on severity: CPAP for moderate to severe cases or symptomatic mild cases, and weight loss or oral appliances for mild cases. 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 Symptoms may be related to obesity, post-COVID syndrome or linked to sleep apnea. Discussed possibility of reactive airway disease post-COVID and option of using an albuterol  inhaler as needed. - Prescribe albuterol  inhaler for use as needed - Advise staying up to date with vaccinations, including flu vaccine - Recommend multivitamin with zinc and vitamin C to strengthen immune system - If symptoms persist check pulmonary function testing   Gastroesophageal Reflux Disease (GERD) GERD, which can exacerbate nighttime symptoms such as coughing. Recommend elevate HOB while sleeping and weight loss.   Obesity  Discussed potential weight loss medication, Zepbound, for obesity and sleep apnea. Explained it is approved for these conditions and may aid in weight loss, which can help reverse sleep apnea. Discussed insurance coverage and self-pay options. Explained that weight loss can help reverse sleep apnea and that Zepbound is typically a long-term medication.  - Discuss Zepbound (trazapotide) with primary care provider for potential prescription - Consider weight loss as part of long-term management for OSA  Follow-up Plan to follow up on sleep study results. If positive for OSA, consider virtual visit to discuss CPAP initiation or other management options. - Follow up in 2-3 weeks after sleep study results are available - Schedule virtual visit if sleep study is positive for OSA  Antonio Baumgarten, NP 11/02/2023

## 2023-11-02 NOTE — Patient Instructions (Addendum)
-  OBSTRUCTIVE SLEEP APNEA (OSA): Obstructive Sleep Apnea (OSA) is a condition where your airway becomes blocked during sleep, often by the tongue, causing you to stop breathing temporarily. We will conduct a home sleep study to confirm the diagnosis. In the meantime, use a wedge pillow to elevate your head during sleep and consider an over-the-counter mouthpiece to prevent tongue biting. If OSA is confirmed, we may discuss CPAP therapy or refer you to a dentist for an oral appliance.  -POST-COVID-19 CONDITION: Post-COVID-19 condition refers to ongoing symptoms that persist after recovering from COVID-19. You have been experiencing fatigue and shortness of breath since your infection. We prescribed an albuterol  inhaler to use as needed and recommended staying up to date with vaccinations, including the flu vaccine. Taking a multivitamin with zinc and vitamin C may also help strengthen your immune system.  -GASTROESOPHAGEAL REFLUX DISEASE (GERD): Gastroesophageal Reflux Disease (GERD) is a condition where stomach acid frequently flows back into the esophagus, causing irritation. There were no specific changes to your management plan for this condition during today's visit.  -GOALS OF CARE: We discussed the potential use of Zepbound for weight loss, which can help manage obesity and sleep apnea. This medication is typically used long-term, and stopping it may result in regaining some weight. Please discuss this option with your primary care provider.  Follow-up Please follow up in 2-3 weeks after your sleep study results are available. If the sleep study confirms OSA, we will schedule a virtual visit to discuss CPAP initiation or other management options.

## 2023-11-24 ENCOUNTER — Encounter

## 2023-11-24 DIAGNOSIS — R0683 Snoring: Secondary | ICD-10-CM

## 2023-12-13 ENCOUNTER — Ambulatory Visit: Payer: Self-pay

## 2023-12-13 NOTE — Telephone Encounter (Signed)
 FYI Only or Action Required?: FYI only for provider  Patient was last seen in primary care on 09/03/2023 by Jess Morita, MD. Called Nurse Triage reporting Hand Pain. Symptoms began several months ago. Interventions attempted: Other: Compression glove. Symptoms are: gradually worsening.  Triage Disposition: See HCP Within 4 Hours (Or PCP Triage)  Patient/caregiver understands and will follow disposition?: Yes          Copied from CRM [id number]. Topic: Clinical - Red Word Triage >> Dec 13, 2023  8:47 AM Alyse July wrote: Red Word that prompted transfer to Nurse Triage: Extreme Hand Pain   ----------------------------------------------------------------------- From previous Reason for Contact - Scheduling: Patient/patient representative is calling to schedule an appointment. Refer to attachments for appointment information. Reason for Disposition  [1] SEVERE pain (e.g., excruciating, unable to use hand at all) AND [2] not improved after 2 hours of pain medicine  Answer Assessment - Initial Assessment Questions 1. ONSET: When did the pain start?     X 6 months ago  2. LOCATION: Where is the pain located?       Right hand   3. PAIN: How bad is the pain? (Scale 1-10; or mild, moderate, severe)   - MILD (1-3): doesn't interfere with normal activities   - MODERATE (4-7): interferes with normal activities (e.g., work or school) or awakens from sleep   - SEVERE (8-10): excruciating pain, unable to use hand at all     Severe 4. WORK OR EXERCISE: Has there been any recent work or exercise that involved this part (i.e., hand or wrist) of the body?     No  5. CAUSE: What do you think is causing the pain?      Unknown  6. AGGRAVATING FACTORS: What makes the pain worse? (e.g., using computer)     Pain gets worse after writing/typing. 7. OTHER SYMPTOMS: Do you have any other symptoms? (e.g., neck pain, swelling, rash, numbness, fever)     No    Pain occurs mainly at  night, pt. Has been seen by PCP for this. Compression glove not affective. Pain is getting worse. Hand movements and cold water helps ease the pain. Pain gets worse after writing/typing. Sooner appointment offered, patient would like to be see scheduled the same day as her mother's appointment day. (also a patient)  Protocols used: Hand and Wrist Pain-A-AH

## 2023-12-13 NOTE — Telephone Encounter (Signed)
 FYI pt has appt on the 18th to see you

## 2023-12-14 ENCOUNTER — Telehealth: Payer: Self-pay

## 2023-12-14 NOTE — Telephone Encounter (Unsigned)
 Copied from CRM [id number]. Topic: Clinical - Lab/Test Results >> Dec 14, 2023 11:58 AM Chantha C wrote: Reason for CRM: Patient wants results for at home sleep study results and sent the kit back to [facility] a few weeks ago. Please call back [phone].

## 2023-12-17 NOTE — Telephone Encounter (Signed)
 The results are in the chart. I will send to Irby Mannan, NP to advise on results.

## 2023-12-18 LAB — HM MAMMOGRAPHY

## 2023-12-18 NOTE — Telephone Encounter (Signed)
 HST on 11/24/23 showed severe sleep apnea, he had an average of 31 apneas per hour. Recommend starting on auto CPAP 5-20cm h20, please place order if patient is open to treatment. Aim to wear CPAP nightly for >4 hours. If has additional questions set up virtual visit. Needs FU in 31- 90 days for compliance check.

## 2023-12-19 ENCOUNTER — Encounter: Payer: Self-pay | Admitting: Primary Care

## 2023-12-19 ENCOUNTER — Telehealth: Payer: Self-pay

## 2023-12-19 ENCOUNTER — Ambulatory Visit (INDEPENDENT_AMBULATORY_CARE_PROVIDER_SITE_OTHER): Admitting: Family Medicine

## 2023-12-19 ENCOUNTER — Telehealth (INDEPENDENT_AMBULATORY_CARE_PROVIDER_SITE_OTHER): Admitting: Primary Care

## 2023-12-19 ENCOUNTER — Encounter: Payer: Self-pay | Admitting: Family Medicine

## 2023-12-19 VITALS — BP 140/90 | HR 77 | Temp 98.2°F | Ht 66.0 in

## 2023-12-19 VITALS — Ht 67.0 in | Wt 300.0 lb

## 2023-12-19 DIAGNOSIS — Z6830 Body mass index (BMI) 30.0-30.9, adult: Secondary | ICD-10-CM

## 2023-12-19 DIAGNOSIS — G4733 Obstructive sleep apnea (adult) (pediatric): Secondary | ICD-10-CM

## 2023-12-19 DIAGNOSIS — I10 Essential (primary) hypertension: Secondary | ICD-10-CM

## 2023-12-19 DIAGNOSIS — M79641 Pain in right hand: Secondary | ICD-10-CM | POA: Diagnosis not present

## 2023-12-19 NOTE — Telephone Encounter (Signed)
 Please have pt scheduled to see Irby Mannan, NP in 6-8 weeks for CPAP compliance. Can be virtual.

## 2023-12-19 NOTE — Assessment & Plan Note (Signed)
 Deteriorated.  BP is elevated today but she is here w/ mom and is very frustrated regarding mom's walker situation and the hoops they are having to jump through.  Suspect this is stress related as BP at pulmonary was 108/76 last month.  Currently asymptomatic w/ exception of intermittent LE swelling but she has been traveling and eating out which is a change for her.  Encouraged low salt diet, increased water.  Will follow.

## 2023-12-19 NOTE — Telephone Encounter (Signed)
 Patient is scheduled for MyChart video visit on 8/1 with Beth.

## 2023-12-19 NOTE — Progress Notes (Signed)
 Virtual Visit via Video Note  I connected with Marcia Chan on 12/19/23 at  1:30 PM EDT by a video enabled telemedicine application and verified that I am speaking with the correct person using two identifiers.  Location: Patient: Home Provider: Office   I discussed the limitations of evaluation and management by telemedicine and the availability of in person appointments. The patient expressed understanding and agreed to proceed.  History of Present Illness: 53 year old female, never smoked.  Past medical history significant for hypertension, seasonal allergic rhinitis, GERD, cardiac murmur, hyperlipidemia, morbid obesity.  Previous [facility] pulmonary encounter:  11/02/2023 Discussed the use of AI scribe software for clinical note transcription with the patient, who gave verbal consent to proceed.  History of Present Illness   Marcia Chan is a 53 year old female who presents with sleep disturbances and suspected sleep apnea. She was referred by her primary care physician for evaluation of her sleep disturbances and daytime fatigue.  She experiences loud snoring that disrupts her husband's sleep and has been informed by her husband and mother that she stops breathing during sleep. She also wakes up gasping or choking. These symptoms are new.  She goes to bed at 9 PM, taking about 15 minutes to fall asleep, but her sleep is restless, waking up three to four times a night, and she starts her day at 5:30 AM. Despite this, she feels tired upon waking and experiences daytime fatigue, often feeling exhausted. She has not undergone a sleep study before and does not use CPAP or oxygen.  She has gained 20 pounds recently but does not notice a correlation between the weight gain and worsening sleep quality or snoring. She experiences shortness of breath more frequently in the past three to four months, along with hand and feet swelling and joint stiffness. No history of cardiac issues, COPD,  asthma, seizures, or sleepwalking.  She contracted COVID-19 last spring (2024) and reports increased frequency of illness and various symptoms, including leg pains, since then. A CT scan in May 2024 showed clear lungs, normal pulmonary arteries, and a normal-sized heart. An ultrasound of her legs showed no blood clots.  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 She has a history of high blood pressure, high cholesterol, and acid reflux, and she had her gallbladder removed in 2024. She denies smoking and has no history of asthma as a child.          12/19/2023- Interim hx  Discussed the use of AI scribe software for clinical note transcription with the patient, who gave verbal consent to proceed.  History of Present Illness   Marcia Chan is a 53 year old female with severe sleep apnea who presents with sleep disruption and daytime fatigue. She was referred by her primary care physician for sleep disruption and daytime fatigue.  Initially presented in May for sleep consult with sleep disruption and daytime fatigue. She experiences loud snoring that disrupts her husband's sleep, witnessed apneas, and episodes of waking up gasping or choking.  A sleep study conducted at the end of May confirmed severe sleep apnea, with an average of 32 apneic events per hour. Baseline oxygen saturation was 99%, but she experienced 189 drops in oxygen desaturation, with the lowest level reaching 70%. She spent approximately 30 minutes with an oxygen level below 88%.  Observations/Objective:  Appears well without overt respiratory symptoms  HST 11/24/23 showed severe sleep apnea, AHI 32.1/hour with SpO2 low 70% (baselinr 99%).  Patient spent 32 min with O2<88%.    Assessment and Plan:  Assessment and Plan    Severe Obstructive Sleep Apnea Severe obstructive sleep apnea confirmed by sleep study with 32 apneic events per hour and significant nocturnal oxygen desaturations, with the  lowest oxygen level at 70%. CPAP is recommended as the standard treatment for severe sleep apnea. Alternative options, including weight loss, oral appliance, and surgical interventions, were discussed, but CPAP was emphasized as the most effective treatment. Benefits of CPAP, such as reduction in snoring, apneic events, and improvement in sleep quality, were explained. Potential challenges with CPAP use were discussed, and she was encouraged to communicate any issues. Health risks associated with untreated sleep apnea, such as cardiac arrhythmia, stroke, pulmonary hypertension, and diabetes, were highlighted. Compliance requirements for insurance coverage, including wearing the CPAP for at least four hours per night for 70% of the nights, were explained. - Order CPAP machine with auto settings 5-20cm h20. - Advised patient aim to wear CPAP nightly 4-6 hours or longer - Encourage weight loss efforts - Schedule compliance check 31 to 90 days after CPAP initiation. - Provide after visit summary with CPAP maintenance instructions.  Obesity Obesity with a BMI over 30, contributing to the severity of sleep apnea. Weight loss was discussed as a potential treatment to reduce apneic events. Recommended discussing GLP-1 medication, Zepbound (tirzepatide), with primary care for weight loss. The mechanism of action of Zepbound and potential side effects, including nausea, vomiting, diarrhea, constipation, and contraindication in patients with medullary thyroid  cancer, were explained. Insurance coverage challenges and self-pay options for Zepbound, with a cost of approximately $499 per month, were discussed. - Include recommendation for GLP-1 medication (Zepbound) in after visit summary. - Advise her to discuss Zepbound with primary care provider. - Instruct her to check insurance coverage for Zepbound.  Follow Up Instructions:  6-8 week follow-up for CPAP compliance    I discussed the assessment and treatment  plan with the patient. The patient was provided an opportunity to ask questions and all were answered. The patient agreed with the plan and demonstrated an understanding of the instructions.   The patient was advised to call back or seek an in-person evaluation if the symptoms worsen or if the condition fails to improve as anticipated.  I provided 22 minutes of non-face-to-face time during this encounter.   Antonio Baumgarten, NP

## 2023-12-19 NOTE — Patient Instructions (Addendum)
 Follow up in 6 weeks to recheck blood pressure Try and limit your salt intake- restaurant food, frozen meals, etc Increase your water intake Get a wrist brace at [facility] or [employer] or similar and wear for support and comfort (something like this)  We'll call you with your hand appt for a complete evaluation ICE your wrist at the carpal tunnel (where it hurts on the mouse) Call with any questions or concerns Hang in there!!!

## 2023-12-19 NOTE — Patient Instructions (Addendum)
 -SEVERE OBSTRUCTIVE SLEEP APNEA: Severe obstructive sleep apnea is a condition where your airway becomes blocked during sleep, causing breathing interruptions. Your sleep study showed 32 apneic events per hour and significant drops in oxygen levels. We recommend using a CPAP machine, which will help keep your airway open and improve your sleep quality. We discussed the benefits and potential challenges of using CPAP, and the importance of compliance for insurance coverage. A CPAP machine with auto settings will be ordered for you, and a medical supply store will contact you for pick-up and mask fitting. We will schedule a compliance check 31 to 90 days after you start using the CPAP.  -OBESITY: Obesity, defined as having a BMI over 30, can worsen sleep apnea. We discussed weight loss as a way to reduce apneic events. We recommend discussing the medication Zepbound (tirzepatide) with your primary care provider, which can aid in weight loss. We explained how Zepbound works, its potential side effects, and the cost considerations. Please check with your insurance about coverage for Zepbound.  INSTRUCTIONS: Please follow up with your primary care provider to discuss the medication Zepbound for weight loss. Additionally, ensure you use the CPAP machine for at least four hours per night for 70% of the nights to meet compliance requirements. We will schedule a compliance check 31 to 90 days after you start using the CPAP.  Orders: Auto CPAP 5-20cm h20   Follow-up 6-8 weeks follow-up with Beth NP for CPAP compliance    CPAP and BIPAP Information CPAP and BIPAP use air pressure to keep your airways open and help you breathe well. CPAP and BIPAP use different amounts of pressure. Your health care provider will tell you whether CPAP or BIPAP would be best for you. CPAP stands for continuous positive airway pressure. With CPAP, the amount of pressure stays the same while you breathe in and out. BIPAP stands  for bi-level positive airway pressure. With BIPAP, the amount of pressure will be higher when you breathe in and lower when you breathe out. This allows you to take bigger breaths. CPAP or BIPAP may be used in the hospital or at home. You may need to have a sleep study before your provider can order a device for you to use at home. What are the advantages? CPAP and BIPAP are most often used for obstructive sleep apnea to keep the airways from collapsing when the muscles relax during sleep. CPAP or BIPAP can be used if you have: Chronic obstructive pulmonary disease. Heart failure. Medical conditions that cause muscle weakness. Other problems that cause breathing to be shallow, weak, or difficult. What are the risks? Your provider will talk with you about risks. These may include: Sores on your nose or face caused from the mask, prongs, or nasal pillows. Dry or stuffy nose or nosebleeds. Feeling gassy or bloated. Sinus or lung infection if the equipment is not cleaned well. When should CPAP or BIPAP be used? In most cases, CPAP or BIPAP is used during sleep at night or whenever the main sleep time happens. It's also used during naps. People with some medical conditions may need to wear the mask when they're awake. Follow instructions from your provider about when to use your CPAP or BIPAP. What happens during CPAP or BIPAP?  Both CPAP and BIPAP use a small machine that uses electricity to create air pressure. A long tube connects the device to a plastic mask. Air is blown through the mask into your nose or mouth. The amount  of pressure that's used to blow the air can be adjusted. Your provider will set the pressure setting and help you find the best mask for you. Tips for using the mask There are different types and sizes of masks. If your mask does not fit well, talk with your provider about getting a different one. Some common types of masks include: Full face masks, which fit over the mouth  and nose. Nasal masks, which fit over the nose. Nasal pillow or prong masks, which fit into the nostrils. The mask needs to be snug to your face, so some people feel trapped or closed in at first. If you feel this way, you may need to get used to the mask. Hold the mask loosely over your nose or mouth and then gradually put the the mask on more snugly. Slowly increase the amount of time you use the mask. If you have trouble with your mask not fitting well or leaking, talk with your provider. Do not stop using the mask. Tips for using the device Follow instructions from your provider about how to and how often to use the device. For home use, CPAP and BIPAP devices come from home health care companies. There are many different brands. Your health insurance company will help to decide which device you get. Keep the CPAP or BIPAP device and attachments clean. Ask your home health care company or check the instruction book for cleaning instructions. Make sure the humidifier is filled with germ-free (sterile) water and is working correctly. This will help prevent a dry or stuffy nose or nosebleeds. A nasal saline mist or spray may keep your nose from getting dry and sore. Do not eat or drink while the CPAP or BIPAP device is on. Food or drinks could get pushed into your lungs by the pressure of the CPAP or BIPAP. Follow these instructions at home: Take over-the-counter and prescription medicines only as told by your provider. Do not smoke, vape, or use nicotine or tobacco. Contact a health care provider if: You have redness or pressure sores on your head, face, mouth, or nose from the mask or headgear. You have trouble using the CPAP or BIPAP device. You have trouble going to sleep or staying asleep. Someone tells you that you snore even when wearing your CPAP or BIPAP device. Get help right away if: You have trouble breathing. You feel confused. These symptoms may be an emergency. Get help right  away. Call 911. Do not wait to see if the symptoms will go away. Do not drive yourself to the hospital. This information is not intended to replace advice given to you by your health care provider. Make sure you discuss any questions you have with your health care provider. Document Revised: 10/11/2022 Document Reviewed: 10/11/2022 Elsevier Patient Education  2024 [employer].

## 2023-12-19 NOTE — Telephone Encounter (Signed)
 I called and spoke to pt. Pt informed of Beth's note and verbalized understanding. Pt states she would like to wait until her virtual appointment with Beth this afternoon to discuss the CPAP further since this is all new to her. NFN

## 2023-12-19 NOTE — Telephone Encounter (Signed)
 Notes saw Pulm, was told she has severe sleep apnea and is advised to take zepbound.  Please advise

## 2023-12-19 NOTE — Progress Notes (Signed)
   Subjective:    Patient ID: Marcia Chan, female    DOB: [birth year], 53 y.o.   MRN: [id number]  HPI Hand pain- R hand.  Was initially only at night but has worsened to having pain all day.  Sxs started ~6 months ago.  Having difficulty w/ grip, writing, holding the steering wheel.  Will have numbness/tingling of hand/fingers.  Hand feels 'tight'.  R handed.    HTN- pt's BP is elevated today.  She reports recent swelling of feet and ankles.  Was out of town last weekend and ate out all meals.  Last month at Pulmonary, BP was 108/76.  Currently on Coreg  6.25mg  BID, Triamterene  hydrochlorothiazide 37.5/25mg  daily   Review of Systems For ROS see HPI     Objective:   Physical Exam Vitals reviewed.  Constitutional:      General: She is not in acute distress.    Appearance: Normal appearance. She is well-developed. She is obese. She is not ill-appearing.  HENT:     Head: Normocephalic and atraumatic.   Eyes:     Conjunctiva/sclera: Conjunctivae normal.     Pupils: Pupils are equal, round, and reactive to light.   Neck:     Thyroid : No thyromegaly.   Cardiovascular:     Rate and Rhythm: Normal rate and regular rhythm.     Heart sounds: Normal heart sounds. No murmur heard. Pulmonary:     Effort: Pulmonary effort is normal. No respiratory distress.     Breath sounds: Normal breath sounds.  Abdominal:     General: There is no distension.     Palpations: Abdomen is soft.     Tenderness: There is no abdominal tenderness.   Musculoskeletal:        General: Tenderness (TTP over R carpal tunnel) present. No swelling or deformity.     Cervical back: Normal range of motion and neck supple.  Lymphadenopathy:     Cervical: No cervical adenopathy.   Skin:    General: Skin is warm and dry.   Neurological:     General: No focal deficit present.     Mental Status: She is alert and oriented to person, place, and time.   Psychiatric:        Mood and Affect: Mood normal.         Behavior: Behavior normal.           Assessment & Plan:  R hand pain- deteriorated.  Pt is now having difficulty w/ grip and dropping things.  Doesn't feel her hand is necessarily weak but unable to grip due to pain.  Has tenderness over carpal tunnel.  Encouraged her to get OTC wrist brace and will refer to hand specialist.  Pt expressed understanding and is in agreement w/ plan.

## 2023-12-19 NOTE — Telephone Encounter (Signed)
 Weight loss is a frequently recommended tx for sleep apnea.  Unfortunately, her insurance does not cover Zepbound and paying out of pocket is ~$400/month.  I'm not opposed to writing a prescription, but I know this won't be covered.  I would definitely start using a CPAP machine if apnea is severe and we can work on weight loss over time.

## 2023-12-19 NOTE — Telephone Encounter (Signed)
 Copied from CRM [id number]. Topic: Clinical - Medication Question >> Dec 19, 2023  2:01 PM Aisha D wrote: Reason for CRM: Pt stated that she seen a pulmonologist today and was informed that she has severe sleep apnea. Pt stated that she was advised to contact her pcp to see if she is able to be prescribed Zepbound. Pt would like a callback today regarding this concern.

## 2023-12-20 NOTE — Telephone Encounter (Signed)
 Pt has been notified.

## 2023-12-25 ENCOUNTER — Encounter: Payer: Self-pay | Admitting: Family Medicine

## 2024-01-07 ENCOUNTER — Other Ambulatory Visit: Payer: Self-pay

## 2024-01-07 DIAGNOSIS — I10 Essential (primary) hypertension: Secondary | ICD-10-CM

## 2024-01-07 MED ORDER — PRAVASTATIN SODIUM 40 MG PO TABS: ORAL_TABLET | ORAL | 1 refills | Status: DC

## 2024-01-11 ENCOUNTER — Telehealth: Payer: Self-pay

## 2024-01-11 NOTE — Telephone Encounter (Signed)
 Copied from CRM [id number]. Topic: General - Billing Inquiry >> Jan 01, 2024 12:33 PM Russell PARAS wrote: Reason for CRM:  Pt is calling due to receiving letter from [facility], who provided her at home sleep study. The letter states that they are out of network with her insurance, which pt was not aware of.  Spoke with Billing, who transferred her over to clinic and advised her that the nurse that ordered the study would need to be the one she spoke with.  CB#[phone] >> Jan 10, 2024  3:27 PM Russell PARAS wrote: Pt is reaching out to clinic again concerning billing issue that is delaying her CPAP orderd CB#[phone] >> Jan 09, 2024  1:51 PM Sherlean HERO wrote: Spoke to [place] with [employer]. There was a  lack of note taking on [place] [facility] end which is causing this confusion. She was a no show for her appointment to recieve it. [name] is guessing they are waiting for her to contact them. But he doesn't know so he's contacting [employer]'s [place] branch to find out more information since they didn't receive anything on their end of any issues. They will contact the patient and then [name] will contact me after he recieves further information.  >> Jan 09, 2024 10:14 AM Rozanna G wrote: PT CALLING AGAIN ABOUT THE BILLING ISSUE FROM THE CRM [phone] STATED SHE IS ON HOLD FOR GETTING HER CPAP MACHINE DUE TO AN ERROR THAT WAS DONE ON THE SLEEP STUDY CODING. PT WOULD LIKE TO BE CONTACTED AS SOON AS POSSIBLE TO HAVE THIS CORRECTED.  >> Jan 09, 2024 10:09 AM Cranford J wrote: Patient has called back. This is her third time. She is having a billing issue with [employer] for her home sleep study and would like it sorted.  >> Jan 07, 2024 10:17 AM Rozanna MATSU wrote: PT CALLING BACK ABOUT THE INSURANCE PORTION OF THE HOME SLEEP STUDY. PT IS REQUESTING A CALL FROM THE NURSE WHO PUT THE CLAIM IN PER BILLING OFFICE HAS ADVISED PT     Silvano can you help out any with this?

## 2024-01-14 ENCOUNTER — Telehealth: Payer: Self-pay

## 2024-01-14 NOTE — Telephone Encounter (Signed)
 Copied from CRM [id number]. Topic: General - Other >> Jan 14, 2024  1:43 PM Marcia Chan wrote: Reason for CRM: Patient is insisting on speaking with the clinic supervisor as she has still not received a  update on why her sleep study was coded wrong as it is causing insurance issues and delaying obtaining her CPAP.  Called and spoke to patient. She is requesting update on cpap machine.  Ordering to our records, order was placed to to [facility] 12/19/2023.   Spoke to [place] with [employer] . He stated that patient has a 1000 dollar deductible that insurance is applying to the sleep study amount. [name] stated that Ronal would be reaching out to patient to discuss further. [name] suggested that patient get a EOB to confirm.  Pt is aware of above message and voiced her understanding. She will reach out to insurance.

## 2024-01-14 NOTE — Telephone Encounter (Signed)
 Copied from CRM [id number]. Topic: Referral - Question >> Jan 14, 2024  1:49 PM Deaijah H wrote:   Reason for CRM: Patient would like to know if Dr. Mahlon could send another referral to a different pulmonologist. Please call [phone]

## 2024-01-14 NOTE — Telephone Encounter (Signed)
 Patient called back. She stated that she reached out to insurance and was advised that the sleep study kit was sent to a company that was out of network, therefore she must pay the out of the pocket deductible. She is upset that she was not informed that [employer] was not in network with her insurance.  I did advise patient that [employer] should have obtained at PA and contacted her with cost prior to mailing the device.   Patient states that she would contact [employer] for further information.

## 2024-01-14 NOTE — Telephone Encounter (Signed)
Please advise PA needed.

## 2024-01-15 ENCOUNTER — Telehealth: Payer: Self-pay

## 2024-01-15 ENCOUNTER — Ambulatory Visit: Admitting: Orthopedic Surgery

## 2024-01-15 NOTE — Telephone Encounter (Signed)
 Thank you for the reply Yes, she is asking for another Pulmonary office.

## 2024-01-15 NOTE — Telephone Encounter (Signed)
**Note De-identified  [place] Obfuscation** Please advise 

## 2024-01-15 NOTE — Telephone Encounter (Signed)
 Dr.Tabori is PCP

## 2024-01-15 NOTE — Telephone Encounter (Signed)
Lm for patient for update.  

## 2024-01-15 NOTE — Telephone Encounter (Signed)
 [facility] team only does medications, not medical equipment.

## 2024-01-15 NOTE — Telephone Encounter (Signed)
 Appears she was referred for sleep issues, they were treating her for obstructive sleep apnea.  Can refer her to different sleep specialist if she would prefer.  There is another message regarding her DME.

## 2024-01-15 NOTE — Telephone Encounter (Signed)
 Looks like message was left that patient would contact [employer]?  If DME company is out of network do we need to send supplies to a different company that is in network?  It looks like this was ordered by pulmonary, so may need to coordinate with Almarie Ferrari, NP.

## 2024-01-15 NOTE — Telephone Encounter (Signed)
 Margie or [facility] team can you follow-up on this, patient had an issue with billing

## 2024-01-16 NOTE — Telephone Encounter (Signed)
 Called patient and asked her if she would like to be seen at [facility] or [facility] as mentioned below. She told me that someone recommended [facility] in [place]. She has an appointment next Wednesday,.

## 2024-01-16 NOTE — Telephone Encounter (Signed)
 Noted.  If she wants to be seen by a different pulmonologist, may need to go through provider at [facility] or in [facility] as [facility] is the main pulmonary group here.  Not sure if other pulmonary office would treat just sleep apnea.  We do have the sleep center at [facility] that does treat sleep apnea, and I do think if that cardiology does as well, I believe Dr. Shlomo but would have referrals to check into that first.  Thanks.

## 2024-01-17 ENCOUNTER — Encounter: Payer: Self-pay | Admitting: Physician Assistant

## 2024-01-17 NOTE — Telephone Encounter (Addendum)
 Spoke to patient. She stated that she reached out to [facility] and was advised that she would need to pay around 230 for the sleep study.  She stated that issue now is that she has not received the cpap. She is wanting to know if cpap cost will change since sleep study cost was resolved.    I have spoken to [facility] with [employer]. He stated that he would forward to his branch manager, Tabitha to research further.  Lm for patient to provide update.

## 2024-01-24 ENCOUNTER — Ambulatory Visit: Admitting: Family Medicine

## 2024-01-29 NOTE — Telephone Encounter (Signed)
 Lm x2 for patient for update.  Will close encounter per office protocol.

## 2024-02-01 ENCOUNTER — Ambulatory Visit (HOSPITAL_BASED_OUTPATIENT_CLINIC_OR_DEPARTMENT_OTHER): Admitting: Primary Care

## 2024-02-21 ENCOUNTER — Ambulatory Visit: Admitting: Family Medicine

## 2024-03-11 ENCOUNTER — Ambulatory Visit: Admitting: Physician Assistant

## 2024-03-17 ENCOUNTER — Ambulatory Visit: Admitting: Family Medicine

## 2024-03-24 ENCOUNTER — Encounter: Admitting: Family Medicine

## 2024-03-30 ENCOUNTER — Other Ambulatory Visit: Payer: Self-pay | Admitting: Family Medicine

## 2024-03-30 DIAGNOSIS — I10 Essential (primary) hypertension: Secondary | ICD-10-CM

## 2024-03-31 ENCOUNTER — Encounter: Admitting: Family Medicine

## 2024-04-01 ENCOUNTER — Ambulatory Visit: Admitting: Family Medicine

## 2024-04-01 VITALS — BP 122/82 | HR 80 | Temp 98.0°F | Ht 67.5 in | Wt 293.1 lb

## 2024-04-01 DIAGNOSIS — I10 Essential (primary) hypertension: Secondary | ICD-10-CM

## 2024-04-01 DIAGNOSIS — E559 Vitamin D deficiency, unspecified: Secondary | ICD-10-CM | POA: Diagnosis not present

## 2024-04-01 DIAGNOSIS — Z23 Encounter for immunization: Secondary | ICD-10-CM | POA: Diagnosis not present

## 2024-04-01 DIAGNOSIS — Z0000 Encounter for general adult medical examination without abnormal findings: Secondary | ICD-10-CM

## 2024-04-01 LAB — BASIC METABOLIC PANEL WITH GFR
BUN: 10 mg/dL (ref 6–23)
CO2: 30 meq/L (ref 19–32)
Calcium: 9.6 mg/dL (ref 8.4–10.5)
Chloride: 101 meq/L (ref 96–112)
Creatinine, Ser: 0.7 mg/dL (ref 0.40–1.20)
GFR: 98.23 mL/min (ref >60.00)
Glucose, Bld: 107 mg/dL — ABNORMAL HIGH (ref 70–99)
Potassium: 3.5 meq/L (ref 3.5–5.1)
Sodium: 138 meq/L (ref 135–145)

## 2024-04-01 LAB — CBC WITH DIFFERENTIAL/PLATELET
Basophils Absolute: 0 K/uL (ref 0.0–0.1)
Basophils Relative: 0.4 % (ref 0.0–3.0)
Eosinophils Absolute: 0.1 K/uL (ref 0.0–0.7)
Eosinophils Relative: 2 % (ref 0.0–5.0)
HCT: 38.6 % (ref 36.0–46.0)
Hemoglobin: 13 g/dL (ref 12.0–15.0)
Lymphocytes Relative: 27.7 % (ref 12.0–46.0)
Lymphs Abs: 1.6 K/uL (ref 0.7–4.0)
MCHC: 33.6 g/dL (ref 30.0–36.0)
MCV: 87.9 fl (ref 78.0–100.0)
Monocytes Absolute: 0.6 K/uL (ref 0.1–1.0)
Monocytes Relative: 10 % (ref 3.0–12.0)
Neutro Abs: 3.4 K/uL (ref 1.4–7.7)
Neutrophils Relative %: 59.9 % (ref 43.0–77.0)
Platelets: 246 K/uL (ref 150.0–400.0)
RBC: 4.39 Mil/uL (ref 3.87–5.11)
RDW: 12.7 % (ref 11.5–15.5)
WBC: 5.7 K/uL (ref 4.0–10.5)

## 2024-04-01 LAB — HEPATIC FUNCTION PANEL
ALT: 21 U/L (ref 0–35)
AST: 21 U/L (ref 0–37)
Albumin: 4.1 g/dL (ref 3.5–5.2)
Alkaline Phosphatase: 60 U/L (ref 39–117)
Bilirubin, Direct: 0.1 mg/dL (ref 0.0–0.3)
Total Bilirubin: 0.5 mg/dL (ref 0.2–1.2)
Total Protein: 7.6 g/dL (ref 6.0–8.3)

## 2024-04-01 LAB — HEMOGLOBIN A1C: Hgb A1c MFr Bld: 6.3 % (ref 4.6–6.5)

## 2024-04-01 LAB — LIPID PANEL
Cholesterol: 153 mg/dL (ref 0–200)
HDL: 38.5 mg/dL — ABNORMAL LOW (ref >39.00)
LDL Cholesterol: 100 mg/dL — ABNORMAL HIGH (ref 0–99)
NonHDL: 114.55
Total CHOL/HDL Ratio: 4
Triglycerides: 74 mg/dL (ref 0.0–149.0)
VLDL: 14.8 mg/dL (ref 0.0–40.0)

## 2024-04-01 LAB — TSH: TSH: 1.58 u[IU]/mL (ref 0.35–5.50)

## 2024-04-01 LAB — VITAMIN D 25 HYDROXY (VIT D DEFICIENCY, FRACTURES): VITD: 26.87 ng/mL — ABNORMAL LOW (ref 30.00–100.00)

## 2024-04-01 MED ORDER — PANTOPRAZOLE SODIUM 40 MG PO TBEC: 40.0000 mg | DELAYED_RELEASE_TABLET | Freq: Two times a day (BID) | ORAL | 1 refills | Status: AC

## 2024-04-01 MED ORDER — TRIAMTERENE-HCTZ 37.5-25 MG PO TABS: 1.0000 | ORAL_TABLET | Freq: Every day | ORAL | 1 refills | Status: AC

## 2024-04-01 NOTE — Assessment & Plan Note (Signed)
 Pt's PE WNL w/ exception of BMI.  UTD on pap, mammo, Tdap.  Due for colonoscopy- needs to reschedule appt w/ GI.  Flu shot given today.  Check labs.  Anticipatory guidance provided.

## 2024-04-01 NOTE — Patient Instructions (Addendum)
 Follow up in 6 months to recheck blood pressure and cholesterol (sooner if needed based on mood!) We'll notify you of your lab results and make any changes if needed Continue to work on healthy diet and regular exercise- you're doing great! Reschedule your GI appt at your convenience Call [facility] [phone] or check out [email or web address] Call with any questions or concerns Stay Safe!  Stay Healthy! Hang in there!!!

## 2024-04-01 NOTE — Progress Notes (Signed)
   Subjective:    Patient ID: Marcia Chan, female    DOB: January 29, 1971, 53 y.o.   MRN: [id number]  HPI CPE- UTD on mammo, pap, Tdap.  Due for colonoscopy (appt w/ GI pending).  Declines PNA.  Due for flu and shingrix.    Patient Care Team    Relationship Specialty Notifications Start End  Mahlon Comer BRAVO, MD PCP - General Family Medicine  02/10/11   Carlene Ozell BRAVO., MD Referring Physician Obstetrics and Gynecology  06/03/15   Steva, [facility]    12/19/23     Health Maintenance  Topic Date Due   Hepatitis B Vaccines 19-59 Average Risk (1 of 3 - 19+ 3-dose series) Never done   Pneumococcal Vaccine: 50+ Years (1 of 1 - PCV) Never done   Zoster Vaccines- Shingrix (1 of 2) Never done   Colonoscopy  01/10/2024   Influenza Vaccine  02/01/2024   COVID-19 Vaccine (7 - 2025-26 season) 03/03/2024   DTaP/Tdap/Td (2 - Td or Tdap) 04/17/2024   Mammogram  12/17/2024   Cervical Cancer Screening (HPV/Pap Cotest)  11/19/2025   Hepatitis C Screening  Completed   HIV Screening  Completed   HPV VACCINES  Aged Out   Meningococcal B Vaccine  Aged Out     Review of Systems Patient reports no vision/ hearing changes, adenopathy,fever, weight change,  persistant/recurrent hoarseness , swallowing issues, chest pain, palpitations, edema, persistant/recurrent cough, hemoptysis, dyspnea (rest/exertional/paroxysmal nocturnal), gastrointestinal bleeding (melena, rectal bleeding), abdominal pain, significant heartburn, bowel changes, GU symptoms (dysuria, hematuria, incontinence), Gyn symptoms (abnormal  bleeding, pain),  syncope, focal weakness, memory loss, numbness & tingling, skin/hair/nail changes, abnormal bruising or bleeding, anxiety, or depression.     Objective:   Physical Exam General Appearance:    Alert, cooperative, no distress, appears stated age, obese  Head:    Normocephalic, without obvious abnormality, atraumatic  Eyes:    PERRL, conjunctiva/corneas clear, EOM's intact  both eyes  Ears:    Normal TM's and external ear canals, both ears  Nose:   Nares normal, septum midline, mucosa normal, no drainage    or sinus tenderness  Throat:   Lips, mucosa, and tongue normal; teeth and gums normal  Neck:   Supple, symmetrical, trachea midline, no adenopathy;    Thyroid : no enlargement/tenderness/nodules  Back:     Symmetric, no curvature, ROM normal, no CVA tenderness  Lungs:     Clear to auscultation bilaterally, respirations unlabored  Chest Wall:    No tenderness or deformity   Heart:    Regular rate and rhythm, S1 and S2 normal, no murmur, rub   or gallop  Breast Exam:    Deferred to mammo  Abdomen:     Soft, non-tender, bowel sounds active all four quadrants,    no masses, no organomegaly  Genitalia:    Deferred to GYN  Rectal:    Extremities:   Extremities normal, atraumatic, no cyanosis or edema  Pulses:   2+ and symmetric all extremities  Skin:   Skin color, texture, turgor normal, no rashes or lesions  Lymph nodes:   Cervical, supraclavicular, and axillary nodes normal  Neurologic:   CNII-XII intact, normal strength, sensation and reflexes    throughout          Assessment & Plan:

## 2024-04-02 ENCOUNTER — Ambulatory Visit: Payer: Self-pay | Admitting: Family Medicine

## 2024-04-02 DIAGNOSIS — E559 Vitamin D deficiency, unspecified: Secondary | ICD-10-CM

## 2024-04-02 MED ORDER — VITAMIN D (ERGOCALCIFEROL) 1.25 MG (50000 UNIT) PO CAPS: 50000.0000 [IU] | ORAL_CAPSULE | ORAL | 0 refills | Status: AC

## 2024-04-07 ENCOUNTER — Ambulatory Visit

## 2024-05-05 ENCOUNTER — Encounter: Payer: Self-pay | Admitting: Radiology

## 2024-05-27 ENCOUNTER — Encounter: Payer: Self-pay | Admitting: Family Medicine

## 2024-05-27 ENCOUNTER — Ambulatory Visit: Admitting: Family Medicine

## 2024-05-27 VITALS — BP 132/76 | HR 77 | Temp 98.3°F | Ht 67.5 in | Wt 298.0 lb

## 2024-05-27 DIAGNOSIS — Z20828 Contact with and (suspected) exposure to other viral communicable diseases: Secondary | ICD-10-CM | POA: Diagnosis not present

## 2024-05-27 DIAGNOSIS — J069 Acute upper respiratory infection, unspecified: Secondary | ICD-10-CM | POA: Diagnosis not present

## 2024-05-27 MED ORDER — OSELTAMIVIR PHOSPHATE 75 MG PO CAPS: 75.0000 mg | ORAL_CAPSULE | Freq: Two times a day (BID) | ORAL | 0 refills | Status: AC

## 2024-05-27 MED ORDER — HYDROCOD POLI-CHLORPHE POLI ER 10-8 MG/5ML PO SUER: 5.0000 mL | Freq: Two times a day (BID) | ORAL | 0 refills | Status: AC | PRN

## 2024-05-27 MED ORDER — BENZONATATE 200 MG PO CAPS: 200.0000 mg | ORAL_CAPSULE | Freq: Two times a day (BID) | ORAL | 0 refills | Status: DC | PRN

## 2024-05-27 NOTE — Progress Notes (Signed)
 Acute Office Visit  Subjective:  Patient ID: Marcia Chan, female    DOB: 11-28-1970  Age: 53 y.o. MRN: [id number]  CC:  Chief Complaint  Patient presents with   Generalized Body Aches   Cough      HPI Marcia Chan is here for Body Aches, Headache, Cough beginning 05/24/2024.   Discussed the use of AI scribe software for clinical note transcription with the patient, who gave verbal consent to proceed.  History of Present Illness Marcia Chan is a 53 year old female who presents with flu-like symptoms.  She has been experiencing fever, chills, and body aches for the past two days. The body aches are severe, and she describes her headache as 'pounding'.  She has a persistent cough that is mostly dry but occasionally produces sputum. The cough is described as intermittent.  She is having difficulty sleeping due to the body aches and discomfort, stating she 'can't get comfortable'.  No issues with urination and she is staying hydrated.  She reports sinus pressure extending down to her eye but denies any ear pain, throat pain, or runny nose.          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   Seasonal allergic rhinitis 10/08/2011   Severe obesity (BMI >= 40) (HCC) 02/10/2011   Sleep apnea    Vertigo    Vitamin D  deficiency 12/14/2016     Past Surgical History:  Procedure Laterality Date   CHOLECYSTECTOMY  07/07/2022   DILATION AND CURETTAGE OF UTERUS  2002, 2003   JOINT REPLACEMENT      Family History  Problem Relation Age of Onset   Arthritis Mother        OA/DJD   Heart disease Mother    Diabetes Mother    Hypertension Mother    Hyperlipidemia Mother    Obesity Mother    Cancer Father    Hypertension Father    Hyperlipidemia Father    Sleep apnea Father    Colon cancer Neg Hx     Social History   Socioeconomic History   Marital status: Married    Spouse name: Not on file   Number of children: Not on file   Years of education: Not on file   Highest education level: Bachelor's degree (e.g., BA, AB, BS)  Occupational History   Occupation: Teacher  Tobacco Use   Smoking status: Never   Smokeless tobacco: Never  Vaping Use   Vaping status: Never Used  Substance and Sexual Activity   Alcohol use: No   Drug use: No   Sexual activity: Not on file  Other Topics Concern   Not on file  Social History Narrative   G3 P1 A2   Social Drivers of Health   Financial Resource Strain: Low Risk  (04/01/2024)  Overall Financial Resource Strain (CARDIA)    Difficulty of Paying Living Expenses: Not very hard  Recent Concern: Financial Resource Strain - Medium Risk (01/25/2024)   Received from [facility]   Overall Financial Resource Strain (CARDIA)    How hard is it for you to pay for the very basics like food, housing, medical care, and heating?: Somewhat hard  Food Insecurity: Food Insecurity Present (04/01/2024)   Hunger Vital Sign    Worried About Running Out of Food in the Last Year: Sometimes true    Ran Out of Food in the Last Year: Never true  Transportation Needs: No Transportation Needs (04/01/2024)   PRAPARE - Administrator, Civil Service (Medical): No    Lack of Transportation (Non-Medical): No  Physical Activity: Sufficiently Active (04/01/2024)   Exercise Vital Sign    Days of  Exercise per Week: 5 days    Minutes of Exercise per Session: 30 min  Recent Concern: Physical Activity - Insufficiently Active (01/25/2024)   Received from [facility]   Exercise Vital Sign    On average, how many days per week do you engage in moderate to strenuous exercise (like a brisk walk)?: 3 days    On average, how many minutes do you engage in exercise at this level?: 20 min  Stress: Stress Concern Present (04/01/2024)   [employer] of Occupational Health - Occupational Stress Questionnaire    Feeling of Stress: Very much  Social Connections: Socially Integrated (04/01/2024)   Social Connection and Isolation Panel    Frequency of Communication with Friends and Family: More than three times a week    Frequency of Social Gatherings with Friends and Family: More than three times a week    Attends Religious Services: More than 4 times per year    Active Member of [employer] or Organizations: Yes    Attends Banker Meetings: More than 4 times per year    Marital Status: Married  Catering Manager Violence: Not At Risk (01/25/2024)   Received from [facility]   HITS    Over the last 12 months how often did your partner physically hurt you?: Never    Over the last 12 months how often did your partner insult you or talk down to you?: Never    Over the last 12 months how often did your partner threaten you with physical harm?: Never    Over the last 12 months how often did your partner scream or curse at you?: Never    ROS All ROS negative except what is listed in the HPI.   Objective:   Today's Vitals: BP 132/76   Pulse 77   Temp 98.3 F (36.8 C) (Oral)   Ht 5' 7.5 (1.715 m)   Wt 298 lb (135.2 kg)   SpO2 99%   BMI 45.98 kg/m   Physical Exam Vitals reviewed.  Constitutional:      General: She is not in acute distress.    Appearance: Normal appearance. She is ill-appearing.  HENT:     Right Ear: Tympanic membrane normal.     Left Ear: Tympanic membrane  normal.     Nose: Congestion present.     Mouth/Throat:     Mouth: Mucous membranes are moist.     Pharynx: Oropharynx is clear. No oropharyngeal exudate or posterior oropharyngeal erythema.  Cardiovascular:     Rate and Rhythm: Normal rate and regular rhythm.  Pulmonary:     Effort: Pulmonary effort is normal.  Breath sounds: Normal breath sounds. No wheezing, rhonchi or rales.  Skin:    General: Skin is warm and dry.  Neurological:     Mental Status: She is alert and oriented to person, place, and time.  Psychiatric:        Mood and Affect: Mood normal.        Behavior: Behavior normal.        Thought Content: Thought content normal.        Judgment: Judgment normal.         Assessment & Plan:   Problem List Items Addressed This Visit   None Visit Diagnoses       Viral URI with cough    -  Primary   Relevant Medications   chlorpheniramine-HYDROcodone  (TUSSIONEX) 10-8 MG/5ML   benzonatate  (TESSALON ) 200 MG capsule   oseltamivir  (TAMIFLU ) 75 MG capsule     Exposure to the flu       Relevant Medications   oseltamivir  (TAMIFLU ) 75 MG capsule      Flu and COVID negative in office today, day 3 of symptoms. 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  Adding cough medicine. Continue supportive measures including rest, hydration, humidifier use, steam showers, warm compresses to sinuses, warm liquids with lemon and honey, and over-the-counter cough, cold, and analgesics as needed.    Patient aware of signs/symptoms requiring further/urgent evaluation.    Follow-up: Return if symptoms worsen or fail to improve.   Waddell FURY Almarie, DNP, FNP-C  I,Emily Lagle,acting as a neurosurgeon for Waddell KATHEE Almarie, NP.,have documented all relevant documentation on the behalf of Waddell KATHEE Almarie, NP.   I, Waddell KATHEE Almarie, NP, have reviewed all documentation for this  visit. The documentation on 05/27/2024 for the exam, diagnosis, procedures, and orders are all accurate and complete.

## 2024-05-27 NOTE — Patient Instructions (Signed)
 Likely a viral upper respiratory infection - consider Flu given recent exposures, but testing today was negative for Flu and COVID.  Continue supportive measures including rest, hydration, humidifier use, steam showers, warm compresses to sinuses, warm liquids with lemon and honey, and over-the-counter cough, cold, and analgesics as needed. If symptoms persist 8-10 days, become severe, or return after a few days of feeling better, then please follow-up for repeat evaluation to determine if antibiotics may be necessary.  Over the counter medications that may be helpful for symptoms:  Guaifenesin  1200 mg extended release tabs twice daily, with plenty of water For cough and congestion Brand name: Mucinex    Pseudoephedrine 30 mg, one or two tabs every 4 to 6 hours For sinus congestion Brand name: Sudafed You must get this from the pharmacy counter.  Oxymetazoline nasal spray each morning, one spray in each nostril, for NO MORE THAN 3 days  For nasal and sinus congestion Brand name: Afrin Saline nasal spray or Saline Nasal Irrigation (Netti Pot, etc) 3-5 times a day For nasal and sinus congestion Brand names: Ocean or AYR Fluticasone  nasal spray OR Mometasone  nasal spray OR Triamcinolone Acetonide nasal spray - follow directions on the packaging For nasal and sinus congestion Brand name: Flonase , Nasonex , Nasacort Warm salt water gargles  For sore throat Every few hours as needed Alternate ibuprofen  400-600 mg and acetaminophen  1000 mg every 6 hours For fever, body aches, headache Brand names: Motrin  or Advil  and Tylenol  Dextromethorphan 12-hour cough version 30 mg every 12 hours  For cough Brand name: Delsym Stop all other cold medications for now (Nyquil, Dayquil, Tylenol  Cold, Theraflu, etc) and other non-prescription cough/cold preparations. Many of these have the same ingredients listed above and could cause an overdose of medication.   Herbal treatments that have been shown to be  helpful in some patients include: Vitamin C 1000 mg per day Zinc 100 mg per day Quercetin 25-500 mg twice a day Melatonin 5-10mg  at bedtime Honey Green Tea  General Instructions Allow your body to rest Drink PLENTY of fluids Typically, we are the most contagious 1-2 days before symptoms start through the first 2-3 days of most severe symptoms. Per CDC guidelines, you can return to school/work when symptoms have started to improve and you have been fever-free for 24 hours. However, recommend you continue extra precautions for the following 5 days (frequent hand hygiene, masking, covering coughs/sneezes, minimize exposure to immunocompromised individuals, etc).  If you develop severe shortness of breath, uncontrolled fevers, coughing up blood, confusion, chest pain, or signs of dehydration (such as significantly decreased urine amounts or dizziness with standing) please go to the nearest ER.

## 2024-06-09 ENCOUNTER — Telehealth: Payer: Self-pay

## 2024-06-09 DIAGNOSIS — M545 Low back pain, unspecified: Secondary | ICD-10-CM

## 2024-06-09 NOTE — Telephone Encounter (Signed)
 Last visit was in September, should patient have a new visit to discuss this rather than proceeding with referral?

## 2024-06-09 NOTE — Telephone Encounter (Signed)
 Called patient, no doctor specific requests. Location requested is in [facility]

## 2024-06-09 NOTE — Telephone Encounter (Signed)
 Ok to to refer to Ortho or Sports medicine for back pain but please ask pt if she has a preference as to office or provider

## 2024-06-09 NOTE — Telephone Encounter (Signed)
 Copied from CRM [id number]. Topic: Referral - Question >> Jun 09, 2024  1:14 PM Sophia H wrote: Reason for CRM: Patient states she has been having back issues - wanting to know if a referral can be sent in. Patient did state that she has mentioned the issues here and there to her provider. # [phone]

## 2024-06-09 NOTE — Addendum Note (Signed)
 Addended by: Makael Stein K on: 06/09/2024 03:25 PM   Modules accepted: Orders

## 2024-06-12 ENCOUNTER — Ambulatory Visit

## 2024-06-12 VITALS — BP 130/92 | Ht 67.5 in | Wt 298.0 lb

## 2024-06-12 DIAGNOSIS — M545 Low back pain, unspecified: Secondary | ICD-10-CM | POA: Diagnosis not present

## 2024-06-12 MED ORDER — DICLOFENAC SODIUM 75 MG PO TBEC: 75.0000 mg | DELAYED_RELEASE_TABLET | Freq: Two times a day (BID) | ORAL | 1 refills | Status: DC

## 2024-06-12 NOTE — Progress Notes (Signed)
 Subjective:    Patient ID: Marcia Chan, female    DOB: 53 y.o., 28-Nov-1970   MRN: [id number]  Chief Complaint: Low back pain  Discussed the use of AI scribe software for clinical note transcription with the patient, who gave verbal consent to proceed.  History of Present Illness Marcia Chan is a 53 year old with past medical history significant for tension, GERD presenting for evaluation of low back pain.  Of note, she was seen in the [facility] emergency department in June 2025 for a back strain.  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  Mild scoliotic curve to the lumbar spine.    Objective:   Vitals:   06/12/24 1546  BP: (!) 130/92    Lumbar Spine -Inspection: no swelling or skin changes -Palpation: TTP - midline, + paraspinals -AROM/PROM: FROM in all planes of the low back -Strength: full hip flexion (L1/L2), knee extension (L3/4), ankle dorsiflexion (L4/5), hip extension (L5/S1), knee flexion (L5/S1/S2) plantarflexion (S1/2). -Sensation: intact sensation over the medial femoral condyle (L3), patella (L4), lateral femoral condyle (L5), lateral malleolus (S1). 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 Pain worsens with arching the back and twisting, without radicular symptoms. There are no signs of spinal cord compression or systemic symptoms like fever, chills, or weight loss. Previous treatment with Motrin  and methocarbamol  provided limited relief. Obesity may contribute through pro-inflammatory effects and increased mechanical stress on weight-bearing joints. Prescribe Voltaren  (diclofenac ) twice daily as a stronger anti-inflammatory. Refer to physical therapy for muscle strengthening exercises to offload facet joints. Schedule follow-up in six weeks to assess response to treatment and consider MRI if no improvement. Discuss potential for a short burst of steroids if initial treatment is insufficient. Advise discontinuation of Motrin  and switch to scheduled dosing of Voltaren .

## 2024-06-18 NOTE — Telephone Encounter (Signed)
 Patient was seen in [place] on 06/12/24.

## 2024-06-21 ENCOUNTER — Encounter (HOSPITAL_BASED_OUTPATIENT_CLINIC_OR_DEPARTMENT_OTHER): Payer: Self-pay | Admitting: Emergency Medicine

## 2024-06-21 ENCOUNTER — Emergency Department (HOSPITAL_BASED_OUTPATIENT_CLINIC_OR_DEPARTMENT_OTHER)
Admission: EM | Admit: 2024-06-21 | Discharge: 2024-06-22 | Disposition: A | Discharge status: Home / Self Care | Attending: Emergency Medicine | Admitting: Emergency Medicine

## 2024-06-21 ENCOUNTER — Other Ambulatory Visit: Payer: Self-pay

## 2024-06-21 DIAGNOSIS — M5432 Sciatica, left side: Secondary | ICD-10-CM

## 2024-06-21 DIAGNOSIS — M545 Low back pain, unspecified: Secondary | ICD-10-CM | POA: Diagnosis present

## 2024-06-21 DIAGNOSIS — M5442 Lumbago with sciatica, left side: Secondary | ICD-10-CM | POA: Insufficient documentation

## 2024-06-21 MED ORDER — HYDROMORPHONE HCL 1 MG/ML IJ SOLN
1.0000 mg | Freq: Once | INTRAMUSCULAR | Status: AC
  Administered 2024-06-21: 1 mg via INTRAVENOUS
  Filled 2024-06-21: qty 1

## 2024-06-21 MED ORDER — OXYCODONE-ACETAMINOPHEN 5-325 MG PO TABS: 1.0000 | ORAL_TABLET | Freq: Four times a day (QID) | ORAL | 0 refills | Status: AC | PRN

## 2024-06-21 MED ORDER — METHOCARBAMOL 1000 MG/10ML IJ SOLN
1000.0000 mg | Freq: Once | INTRAMUSCULAR | Status: AC
  Administered 2024-06-21: 1000 mg via INTRAVENOUS
  Filled 2024-06-21: qty 10

## 2024-06-21 MED ORDER — METHOCARBAMOL 1000 MG PO TABS: 1000.0000 mg | ORAL_TABLET | Freq: Four times a day (QID) | ORAL | 0 refills | Status: DC

## 2024-06-21 NOTE — ED Triage Notes (Signed)
 Pt c/o pain from LT buttock radiating to toes; reports toes and anterior foot are numb

## 2024-06-21 NOTE — ED Provider Notes (Signed)
 " [facility] Provider Note   CSN: [id number] Arrival date & time: 06/21/24  1958     Patient presents with: Leg Pain   Marcia Chan is a 53 y.o. female.   Patient to ED with complaint of sharp, severe pain in the left leg with numbness of all toes of the foot. She has a history of low back pain but reports the radiation down the left is new as of today. No falls, no weakness. She denies bowel or bladder dysfunction. The pain starts in the buttock and goes down the back of the leg to the foot. No swelling.  The history is provided by the patient. No language interpreter was used.  Leg Pain      Prior to Admission medications  Medication Sig Start Date End Date Taking? Authorizing Provider  methocarbamol  1000 MG TABS Take 1,000 mg by mouth 4 (four) times daily. 06/21/24  Yes Jacob Chamblee, Margit, PA-C  oxyCODONE -acetaminophen  (PERCOCET/ROXICET) 5-325 MG tablet Take 1-2 tablets by mouth every 6 (six) hours as needed for severe pain (pain score 7-10). 06/21/24  Yes Brodi Nery, Margit, PA-C  albuterol  (VENTOLIN  HFA) 108 (90 Base) MCG/ACT inhaler Inhale 2 puffs into the lungs every 6 (six) hours as needed for wheezing or shortness of breath. 11/02/23   Hope Almarie ORN, NP  benzonatate  (TESSALON ) 200 MG capsule Take 1 capsule (200 mg total) by mouth 2 (two) times daily as needed for cough. 05/27/24   Almarie Waddell NOVAK, NP  carvedilol  (COREG ) 6.25 MG tablet TAKE 1 TABLET(6.25 MG) BY MOUTH TWICE DAILY WITH A MEAL 03/31/24   Tabori, Katherine E, MD  diclofenac  (VOLTAREN ) 75 MG EC tablet Take 1 tablet (75 mg total) by mouth 2 (two) times daily. 06/12/24   Gottwalt, Redell A, DO  ibuprofen  (ADVIL ) 600 MG tablet Take 1 tablet (600 mg total) by mouth every 6 (six) hours as needed. 05/27/23   Haviland, Julie, MD  levonorgestrel (MIRENA) 20 MCG/24HR IUD 1 each by Intrauterine route once.    [provider]  mometasone  (NASONEX ) 50 MCG/ACT nasal spray  Place 2 sprays into the nose daily. 07/05/23   Tabori, Katherine E, MD  oseltamivir  (TAMIFLU ) 75 MG capsule Take 1 capsule (75 mg total) by mouth 2 (two) times daily. 05/27/24   Almarie Waddell NOVAK, NP  pantoprazole  (PROTONIX ) 40 MG tablet Take 1 tablet (40 mg total) by mouth 2 (two) times daily. 04/01/24   Tabori, Katherine E, MD  pravastatin  (PRAVACHOL ) 40 MG tablet TAKE 1 TABLET(40 MG) BY MOUTH DAILY 01/07/24   Tabori, Katherine E, MD  triamterene -hydrochlorothiazide (MAXZIDE-25) 37.5-25 MG tablet Take 1 tablet by mouth daily. 04/01/24   Tabori, Katherine E, MD  Vitamin D , Ergocalciferol , (DRISDOL ) 1.25 MG (50000 UNIT) CAPS capsule Take 1 capsule (50,000 Units total) by mouth every 7 (seven) days. 04/02/24   Mahlon Comer BRAVO, MD    Allergies: Bextra [valdecoxib]    Review of Systems  Updated Vital Signs BP 125/72   Pulse 80   Temp 98.8 F (37.1 C)   Resp 20   Ht 5' 7.5 (1.715 m)   Wt 135.2 kg   SpO2 95%   BMI 45.98 kg/m   Physical Exam Vitals and nursing note reviewed.  Constitutional:      Appearance: Normal appearance. She is obese.  Abdominal:     Palpations: Abdomen is soft.     Tenderness: There is no abdominal tenderness.  Musculoskeletal:     Comments: Minimal left  mid-lumbar tenderness. There is tenderness in the lower buttock that reproduces the radiating pain to the leg c/w sciatica. No weakness. Distal pulses intact.   Neurological:     Mental Status: She is alert.     Sensory: No sensory deficit.     Motor: No weakness.     (all labs ordered are listed, but only abnormal results are displayed) Labs Reviewed - No data to display  EKG: None  Radiology: No results found.   Procedures   Medications Ordered in the ED  HYDROmorphone  (DILAUDID ) injection 1 mg (1 mg Intravenous Given 06/21/24 2259)  methocarbamol  (ROBAXIN ) injection 1,000 mg (1,000 mg Intravenous Given 06/21/24 2302)    Clinical Course as of 06/22/24 0000  Sat Jun 21, 2024  2353 Patient to ED  with symptoms of left sciatica. History of lumbar back pain, now with radiation to left leg. NO weakness. No neurologic deficits.   IV Dilaudid , Robaxin  provided. On recheck, the patient is feeling much better. She is comfortable with discharge home. Continue Robaxin , will provide pain control. Encourage follow up with her doctor to continue evaluation and management of sciatica.  [SU]    Clinical Course User Index [SU] Odell Balls, PA-C                                 Medical Decision Making Risk Prescription drug management.        Final diagnoses:  Sciatica of left side    ED Discharge Orders          Ordered    oxyCODONE -acetaminophen  (PERCOCET/ROXICET) 5-325 MG tablet  Every 6 hours PRN        06/21/24 2359    methocarbamol  1000 MG TABS  4 times daily        06/21/24 2359               Odell Balls, PA-C 06/22/24 0001  "

## 2024-06-21 NOTE — Discharge Instructions (Addendum)
 As we discussed, follow up with Dr. Charles on Monday to schedule a recheck appointment in the office. Take medications as prescribed. Rest the back as much as possible.   Return to the ED with any worsening symptoms or new concerns.

## 2024-06-22 ENCOUNTER — Telehealth (HOSPITAL_BASED_OUTPATIENT_CLINIC_OR_DEPARTMENT_OTHER): Payer: Self-pay | Admitting: Emergency Medicine

## 2024-06-22 MED ORDER — METHOCARBAMOL 500 MG PO TABS: 500.0000 mg | ORAL_TABLET | Freq: Three times a day (TID) | ORAL | 0 refills | Status: AC | PRN

## 2024-06-22 NOTE — Telephone Encounter (Cosign Needed)
 Patient requests that her prescription for Percocet and methocarbamol  from yesterday be sent to a different pharmacy. Since she was prescribed an opioid, will not send another prescription to different pharmacy.  Encouraged patient to go to the pharmacy is originally sent to if she would like to take this medication.  Patient states that pharmacy does not have the prescribed 1000 mg methocarbamol  tablets.  Will switch prescription to to take 2 500mg  tablets every 8 hours as needed.

## 2024-06-23 ENCOUNTER — Telehealth

## 2024-06-23 DIAGNOSIS — G5702 Lesion of sciatic nerve, left lower limb: Secondary | ICD-10-CM

## 2024-06-23 MED ORDER — PREGABALIN 75 MG PO CAPS: 75.0000 mg | ORAL_CAPSULE | Freq: Two times a day (BID) | ORAL | 0 refills | Status: AC

## 2024-06-23 NOTE — Progress Notes (Addendum)
" ° ° °  Patient ID: Marcia Chan, female    DOB: 53 y.o., [birth year]   MRN: [id number]  Chief Complaint: Piriformis syndrome follow-up Medium: MyChart Video Visit  Patient Location: [place] Provider Location: [place], [place]    Discussed the use of AI scribe software for clinical note transcription with the patient, who gave verbal consent to proceed.  History of Present Illness Seen in ED on 12/20 and diagnosed with piriformis syndrome due to pain radiating down left leg into her foot with palpation of her piriformis.  Has been constant ever since. Mild relief from Voltaren  prescribed on 06/12/2024 Mild relief from Percocet. No B/b incontinence, weakness.  No fevers, chills, etc.  Physical Exam  Const: appears well, non-toxic, well groomed Psych: affect bright, interactive, smiling EENT: EOMI intact, conjunctiva appear normal Neck: no obvious masses, appears symmetric Resp: non-labored, appears symmetric Neuro: muscle bulk appears normal Skin: no obvious rashes noted  Assessment & Plan Symptoms seem to have migrated to favor more of a piriformis syndrome profile than lumbar radiculopathy since my last visit with her.  Stop methocarbamol .  Adding lyrica  and scheduling for sciatic nerve steroid injection next week when office is next open.  Recommend she keep her scheduled PT visit next week.   For billing purposes: Reviewed 12/20 emergency department visit note.  Prescription medicine provided.  "

## 2024-06-30 ENCOUNTER — Ambulatory Visit

## 2024-06-30 ENCOUNTER — Other Ambulatory Visit: Payer: Self-pay

## 2024-06-30 VITALS — BP 130/94 | Ht 67.5 in | Wt 298.0 lb

## 2024-06-30 DIAGNOSIS — I10 Essential (primary) hypertension: Secondary | ICD-10-CM

## 2024-06-30 DIAGNOSIS — G5702 Lesion of sciatic nerve, left lower limb: Secondary | ICD-10-CM

## 2024-06-30 MED ORDER — METHYLPREDNISOLONE ACETATE 40 MG/ML IJ SUSP
40.0000 mg | Freq: Once | INTRAMUSCULAR | Status: AC
  Administered 2024-06-30: 40 mg via INTRA_ARTICULAR

## 2024-06-30 MED ORDER — PRAVASTATIN SODIUM 40 MG PO TABS: ORAL_TABLET | ORAL | 1 refills | Status: AC

## 2024-06-30 MED ORDER — PREDNISONE 20 MG PO TABS: 40.0000 mg | ORAL_TABLET | Freq: Every day | ORAL | 0 refills | Status: DC

## 2024-06-30 NOTE — Addendum Note (Signed)
 Addended by: Dolphus Linch A on: 06/30/2024 02:54 PM   Modules accepted: Orders

## 2024-06-30 NOTE — Progress Notes (Signed)
" ° °  Subjective:    Patient ID: Marcia Chan, female    DOB: 53 y.o., [birth year]   MRN: [id number]  Chief Complaint: Left sided piriformis syndrome  History of Present Illness 53 year old female originally seen by myself on 06/12/2024 where concern was greatest for lumbar back pain but subsequent development of pain radiating down her left lower extremity with pressure provided around the piriformis muscle raise concern for piriformis syndrome.  I subsequently evaluated her in a video visit on 06/23/2024 and the plan was made to proceed with a sciatic nerve injection at the level of the piriformis.  She presents today for this.     Objective:   Vitals:   06/30/24 1310  BP: (!) 130/94   Unfortunately, visualization of the sciatic nerve was not possible with this clinic's linear ultrasound probe.     Assessment & Plan:   Assessment & Plan As a result of not being able to visualize the sciatic nerve, I have reached out to my colleagues in [place] who possess the requisite curvilinear probe to visualize this structure and requested that she be seen by them to pursue this injection. We will reach back out to her as soon as we have confirmation of when this could happen. Provided with home exercise program for piriformis syndrome and tentatively plan on 4 week follow up or sooner as needed.   "

## 2024-07-02 ENCOUNTER — Other Ambulatory Visit: Payer: Self-pay

## 2024-07-09 ENCOUNTER — Encounter: Admitting: Family Medicine

## 2024-07-24 ENCOUNTER — Ambulatory Visit

## 2024-07-24 VITALS — BP 100/80 | Ht 67.5 in | Wt 298.0 lb

## 2024-07-24 DIAGNOSIS — M5416 Radiculopathy, lumbar region: Secondary | ICD-10-CM

## 2024-07-24 DIAGNOSIS — M545 Low back pain, unspecified: Secondary | ICD-10-CM | POA: Diagnosis not present

## 2024-07-24 MED ORDER — DICLOFENAC SODIUM 75 MG PO TBEC: 75.0000 mg | DELAYED_RELEASE_TABLET | Freq: Two times a day (BID) | ORAL | 0 refills | Status: AC

## 2024-07-24 NOTE — Progress Notes (Signed)
" ° °  Subjective:    Patient ID: Marcia Chan, female    DOB: 54 y.o., [birth year]   MRN: [id number]  Chief Complaint: Lumbar back pain / piriformis syndrome  Discussed the use of AI scribe software for clinical note transcription with the patient, who gave verbal consent to proceed.  History of Present Illness Patient originally evaluated by myself on 06/12/2024, was initially treated with oral Voltaren  which provided insufficient pain relief, subsequently seen in the emergency department and then again by myself where treatment plan was switched to involve oral corticosteroid therapy and physical therapy.  She reports complete resolution of the symptoms in her left leg with the exception of tingling in digits 5, 4, and 3 which has persisted.  Otherwise, all discomfort and pain she continues to complain of remains in her back.  No B/B incontinence, fevers, chills, unexpected weight loss, lower extremity weakness.     Objective:   Vitals:   07/24/24 1541  BP: 100/80   Lumbar Spine -Inspection: no swelling or skin changes -Palpation: TTP - midline, + paraspinals -AROM/PROM: FROM in all planes of the low back -Strength: full hip flexion (L1/L2), knee extension (L3/4), ankle dorsiflexion (L4/5), hip extension (L5/S1), knee flexion (L5/S1/S2) plantarflexion (S1/2). -Sensation: intact sensation over the medial femoral condyle (L3), patella (L4), lateral femoral condyle (L5), lateral malleolus (S1). -Reflexes: normal patellar (L3/4), hamstring (L5/S1), achilles (S1/2) reflexes, equal bilaterally -Special tests: + Straight Leg Raise, + Stork, - Slump test      Assessment & Plan:   Assessment & Plan Reesa is a pleasant 54 year old female who continues to complain of pain in her low back, though it seems that things have improved some over the past 3 weeks of physical therapy that she has been engaged in.  Involvement of digits 3 through 5 of her left foot does raise my suspicion for  lumbar radiculopathy rather than piriformis syndrome.  After discussion of options including physical therapy continuation versus obtaining advanced imaging the patient elected to proceed with continued physical therapy.  She will notify me in 3 weeks how she is doing.  I provided a refill of diclofenac  sodium for her.   "

## 2024-08-04 NOTE — Progress Notes (Signed)
 "  Acute Office Visit  Subjective:  Patient ID: Marcia Chan, female    DOB: 01/23/1971  Age: 54 y.o. MRN: [id number]  CC: No chief complaint on file.     HPI NEEMA BARREIRA is here for nasal congestion and rhinorrhea.        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   Seasonal allergic rhinitis 10/08/2011   Severe obesity (BMI >= 40) (HCC) 02/10/2011   Sleep apnea    Vertigo    Vitamin D  deficiency 12/14/2016    Past Surgical History:  Procedure Laterality Date   CHOLECYSTECTOMY  07/07/2022   DILATION AND CURETTAGE OF UTERUS  2002, 2003   JOINT REPLACEMENT      Family History  Problem Relation Age of Onset   Arthritis Mother        OA/DJD   Heart disease Mother    Diabetes Mother    Hypertension Mother    Hyperlipidemia Mother    Obesity Mother    Cancer Father    Hypertension Father    Hyperlipidemia Father    Sleep apnea Father    Colon cancer Neg Hx     Social History   Socioeconomic History   Marital status: Married    Spouse name: Not on file   Number of children: Not on file   Years of education: Not on file   Highest education level: Bachelor's degree (e.g., BA, AB, BS)  Occupational History   Occupation: Runner, Broadcasting/film/video  Tobacco Use   Smoking status: Never   Smokeless tobacco: Never  Vaping Use   Vaping status: Never Used   Substance and Sexual Activity   Alcohol use: No   Drug use: No   Sexual activity: Not on file  Other Topics Concern   Not on file  Social History Narrative   G3 P1 A2   Social Drivers of Health   Tobacco Use: Low Risk (07/24/2024)   Patient History    Smoking Tobacco Use: Never    Smokeless Tobacco Use: Never    Passive Exposure: Not on file  Financial Resource Strain: Low Risk (04/01/2024)   Overall Financial Resource Strain (CARDIA)    Difficulty of Paying Living Expenses: Not very hard  Recent Concern: Financial Resource Strain - Medium Risk (01/25/2024)   Received from [facility]   Overall Financial Resource Strain (CARDIA)    How hard is it for you to pay for the very basics like food, housing, medical care, and heating?: Somewhat hard  Food Insecurity: Food Insecurity Present (04/01/2024)   Epic    Worried About Programme Researcher, Broadcasting/film/video in the Last Year: Sometimes true    Ran Out of Food in the Last Year: Never true  Transportation Needs: No Transportation Needs (04/01/2024)   Epic    Lack of Transportation (Medical):  No    Lack of Transportation (Non-Medical): No  Physical Activity: Sufficiently Active (04/01/2024)   Exercise Vital Sign    Days of Exercise per Week: 5 days    Minutes of Exercise per Session: 30 min  Recent Concern: Physical Activity - Insufficiently Active (01/25/2024)   Received from [facility]   Exercise Vital Sign    On average, how many days per week do you engage in moderate to strenuous exercise (like a brisk walk)?: 3 days    On average, how many minutes do you engage in exercise at this level?: 20 min  Stress: Stress Concern Present (04/01/2024)   [employer] of Occupational Health - Occupational Stress Questionnaire    Feeling of Stress: Very much  Social Connections: Socially Integrated (04/01/2024)   Social Connection and Isolation Panel    Frequency of Communication with Friends and Family: More than three times a week    Frequency of  Social Gatherings with Friends and Family: More than three times a week    Attends Religious Services: More than 4 times per year    Active Member of [employer] or Organizations: Yes    Attends Banker Meetings: More than 4 times per year    Marital Status: Married  Catering Manager Violence: Not At Risk (01/25/2024)   Received from [facility]   HITS    Over the last 12 months how often did your partner physically hurt you?: Never    Over the last 12 months how often did your partner insult you or talk down to you?: Never    Over the last 12 months how often did your partner threaten you with physical harm?: Never    Over the last 12 months how often did your partner scream or curse at you?: Never  Depression (PHQ2-9): Low Risk (05/27/2024)   Depression (PHQ2-9)    PHQ-2 Score: 3  Recent Concern: Depression (PHQ2-9) - Medium Risk (04/01/2024)   Depression (PHQ2-9)    PHQ-2 Score: 5  Alcohol Screen: Low Risk (04/01/2024)   Alcohol Screen    Last Alcohol Screening Score (AUDIT): 1  Housing: High Risk (04/01/2024)   Epic    Unable to Pay for Housing in the Last Year: Yes    Number of Times Moved in the Last Year: Not on file    Homeless in the Last Year: No  Utilities: Not At Risk (01/25/2024)   Received from [facility]    In the past 12 months has the electric, gas, oil, or water company threatened to shut off services in your home?: No  Health Literacy: Not on file    ROS All ROS negative except what is listed in the HPI.   Objective:   Today's Vitals: There were no vitals taken for this visit.  Physical Exam  Assessment & Plan:   Problem List Items Addressed This Visit   None     Follow-up: No follow-ups on file.   Waddell FURY Almarie, DNP, FNP-C  I,Emily Lagle,acting as a neurosurgeon for Waddell KATHEE Almarie, NP.,have documented all relevant documentation on the behalf of Waddell KATHEE Almarie, NP.   I, Waddell KATHEE Almarie, NP, have reviewed all documentation for this  visit. The documentation on 08/05/2024 for the exam, diagnosis, procedures, and orders are all accurate and complete.  "

## 2024-08-05 ENCOUNTER — Encounter: Payer: Self-pay | Admitting: Family Medicine

## 2024-08-05 ENCOUNTER — Ambulatory Visit: Admitting: Family Medicine

## 2024-08-05 VITALS — BP 135/81 | HR 80 | Temp 98.5°F | Ht 67.5 in | Wt 300.8 lb

## 2024-08-05 DIAGNOSIS — B9689 Other specified bacterial agents as the cause of diseases classified elsewhere: Secondary | ICD-10-CM

## 2024-08-05 MED ORDER — BENZONATATE 200 MG PO CAPS: 200.0000 mg | ORAL_CAPSULE | Freq: Two times a day (BID) | ORAL | 0 refills | Status: AC | PRN

## 2024-08-05 MED ORDER — MOMETASONE FUROATE 50 MCG/ACT NA SUSP: 2.0000 | Freq: Every day | NASAL | 12 refills | Status: AC

## 2024-08-05 MED ORDER — DOXYCYCLINE HYCLATE 100 MG PO TABS: 100.0000 mg | ORAL_TABLET | Freq: Two times a day (BID) | ORAL | 0 refills | Status: AC

## 2024-10-06 ENCOUNTER — Ambulatory Visit: Admitting: Family Medicine
